# Patient Record
Sex: Female | Born: 1965 | Race: White | Hispanic: No | Marital: Single | Smoking: Former smoker
Health system: Southern US, Community
[De-identification: ages and names within clinical notes are randomized; demographics above are authoritative.]

## PROBLEM LIST (undated history)

## (undated) DIAGNOSIS — F319 Bipolar disorder, unspecified: Secondary | ICD-10-CM

## (undated) DIAGNOSIS — F329 Major depressive disorder, single episode, unspecified: Secondary | ICD-10-CM

## (undated) DIAGNOSIS — R569 Unspecified convulsions: Secondary | ICD-10-CM

## (undated) DIAGNOSIS — J449 Chronic obstructive pulmonary disease, unspecified: Secondary | ICD-10-CM

## (undated) DIAGNOSIS — R600 Localized edema: Secondary | ICD-10-CM

## (undated) DIAGNOSIS — F429 Obsessive-compulsive disorder, unspecified: Secondary | ICD-10-CM

## (undated) DIAGNOSIS — N189 Chronic kidney disease, unspecified: Secondary | ICD-10-CM

## (undated) DIAGNOSIS — G40909 Epilepsy, unspecified, not intractable, without status epilepticus: Secondary | ICD-10-CM

## (undated) DIAGNOSIS — R609 Edema, unspecified: Secondary | ICD-10-CM

## (undated) DIAGNOSIS — E079 Disorder of thyroid, unspecified: Secondary | ICD-10-CM

## (undated) DIAGNOSIS — K219 Gastro-esophageal reflux disease without esophagitis: Secondary | ICD-10-CM

## (undated) DIAGNOSIS — I10 Essential (primary) hypertension: Secondary | ICD-10-CM

## (undated) DIAGNOSIS — F419 Anxiety disorder, unspecified: Secondary | ICD-10-CM

## (undated) DIAGNOSIS — E039 Hypothyroidism, unspecified: Secondary | ICD-10-CM

## (undated) DIAGNOSIS — M1990 Unspecified osteoarthritis, unspecified site: Secondary | ICD-10-CM

## (undated) DIAGNOSIS — F32A Depression, unspecified: Secondary | ICD-10-CM

## (undated) DIAGNOSIS — F603 Borderline personality disorder: Secondary | ICD-10-CM

## (undated) DIAGNOSIS — D649 Anemia, unspecified: Secondary | ICD-10-CM

## (undated) HISTORY — DX: Borderline personality disorder: F60.3

## (undated) HISTORY — PX: KNEE ARTHROSCOPY: SUR90

## (undated) HISTORY — PX: TONSILLECTOMY: SUR1361

---

## 1999-05-23 HISTORY — PX: FRACTURE SURGERY: SHX138

## 2011-04-27 ENCOUNTER — Emergency Department (INDEPENDENT_AMBULATORY_CARE_PROVIDER_SITE_OTHER)
Admission: EM | Admit: 2011-04-27 | Discharge: 2011-04-27 | Disposition: A | Discharge status: Home / Self Care | Payer: Self-pay | Source: Home / Self Care | Attending: Family Medicine | Admitting: Family Medicine

## 2011-04-27 ENCOUNTER — Encounter: Payer: Self-pay | Admitting: Emergency Medicine

## 2011-04-27 DIAGNOSIS — E119 Type 2 diabetes mellitus without complications: Secondary | ICD-10-CM

## 2011-04-27 HISTORY — DX: Essential (primary) hypertension: I10

## 2011-04-27 HISTORY — DX: Unspecified osteoarthritis, unspecified site: M19.90

## 2011-04-27 HISTORY — DX: Epilepsy, unspecified, not intractable, without status epilepticus: G40.909

## 2011-04-27 LAB — POCT URINALYSIS DIP (DEVICE)
Glucose, UA: NEGATIVE mg/dL
Ketones, ur: NEGATIVE mg/dL
Leukocytes, UA: NEGATIVE
Specific Gravity, Urine: 1.015 (ref 1.005–1.030)

## 2011-04-27 LAB — POCT I-STAT, CHEM 8
BUN: 11 mg/dL (ref 6–23)
Creatinine, Ser: 0.8 mg/dL (ref 0.50–1.10)
Hemoglobin: 15 g/dL (ref 12.0–15.0)
Potassium: 4.2 mEq/L (ref 3.5–5.1)
Sodium: 137 mEq/L (ref 135–145)

## 2011-04-27 MED ORDER — METFORMIN HCL 500 MG PO TABS: ORAL_TABLET | ORAL | Status: DC

## 2011-04-27 NOTE — ED Provider Notes (Signed)
History     CSN: [id number] Arrival date & time: 04/27/2011  1:30 PM   First MD Initiated Contact with Patient 04/27/11 1305      Chief Complaint  Patient presents with  . Hyperglycemia    (Consider location/radiation/quality/duration/timing/severity/associated sxs/prior treatment) HPI Comments: The patient reports she has been out of her medications since 2009. She has been controlling her blood sugar by diet. She has had it checked on at least 2 occasions recently. State was 234 2 days ago. States this am it was 194 . She was seen at irc this am and told to come her for eval. She admits she has had some nausea but no vomiting. No diarrhea   The history is provided by the patient.    Past Medical History  Diagnosis Date  . Diabetes mellitus   . Asthma   . Arthritis   . Hypertension   . Epilepsy     Past Surgical History  Procedure Date  . Tonsillectomy   . Knee arthroscopy     Family History  Problem Relation Age of Onset  . Asthma Other   . Diabetes Other   . Hypertension Other     History  Substance Use Topics  . Smoking status: Never Smoker   . Smokeless tobacco: Not on file  . Alcohol Use: Yes     OCASSIONALLY    OB History    Grav Para Term Preterm Abortions TAB SAB Ect Mult Living                  Review of Systems  Constitutional: Negative.   HENT: Negative.   Respiratory: Negative.   Cardiovascular: Negative.   Gastrointestinal: Negative.   Genitourinary: Negative.   Musculoskeletal: Negative.     Allergies  Abilify and Lithium  Home Medications   Current Outpatient Rx  Name Route Sig Dispense Refill  . CARBAMAZEPINE 200 MG PO TABS Oral Take 200 mg by mouth 3 (three) times daily.      Marland Kitchen METFORMIN HCL 500 MG PO TABS  One tab daily x 1 wk then 1 tablet twice daily 40 tablet 1    BP 109/68  Pulse 64  Temp(Src) 98 F (36.7 C) (Tympanic)  Resp 18  SpO2 99%  Physical Exam  Nursing note and vitals reviewed. Constitutional: She  appears well-developed and well-nourished. She appears distressed.  HENT:  Head: Normocephalic and atraumatic.  Neck: Normal range of motion. Neck supple.  Cardiovascular: Normal rate and regular rhythm.   Pulmonary/Chest: Effort normal and breath sounds normal.  Abdominal: Soft. Bowel sounds are normal.    ED Course  Procedures (including critical care time)  Labs Reviewed  POCT URINALYSIS DIP (DEVICE) - Abnormal; Notable for the following:    Bilirubin Urine SMALL (*)    All other components within normal limits  POCT I-STAT, CHEM 8 - Abnormal; Notable for the following:    Glucose, Bld 185 (*)    All other components within normal limits  URINALYSIS, DIPSTICK ONLY  I-STAT, CHEM 8   No results found.   1. Diabetes mellitus    Patient reports she is seeing the social work who is helping her get an orange card   MDM          Luna Glasgow, MD 04/27/11 [phone]

## 2011-04-27 NOTE — ED Notes (Signed)
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

## 2011-05-24 ENCOUNTER — Emergency Department (INDEPENDENT_AMBULATORY_CARE_PROVIDER_SITE_OTHER)
Admission: EM | Admit: 2011-05-24 | Discharge: 2011-05-24 | Disposition: A | Discharge status: Home / Self Care | Payer: Self-pay | Source: Home / Self Care

## 2011-05-24 ENCOUNTER — Encounter (HOSPITAL_COMMUNITY): Payer: Self-pay

## 2011-05-24 DIAGNOSIS — R42 Dizziness and giddiness: Secondary | ICD-10-CM

## 2011-05-24 DIAGNOSIS — E119 Type 2 diabetes mellitus without complications: Secondary | ICD-10-CM

## 2011-05-24 DIAGNOSIS — R609 Edema, unspecified: Secondary | ICD-10-CM

## 2011-05-24 LAB — POCT PREGNANCY, URINE: Preg Test, Ur: NEGATIVE

## 2011-05-24 LAB — POCT URINALYSIS DIP (DEVICE)
Bilirubin Urine: NEGATIVE
Glucose, UA: NEGATIVE mg/dL
Hgb urine dipstick: NEGATIVE
Ketones, ur: NEGATIVE mg/dL
Leukocytes, UA: NEGATIVE
pH: 6 (ref 5.0–8.0)

## 2011-05-24 LAB — POCT I-STAT, CHEM 8
BUN: 5 mg/dL — ABNORMAL LOW (ref 6–23)
Calcium, Ion: 1.19 mmol/L (ref 1.12–1.32)
Chloride: 100 mEq/L (ref 96–112)
HCT: 39 % (ref 36.0–46.0)
Potassium: 4 mEq/L (ref 3.5–5.1)

## 2011-05-24 MED ORDER — MECLIZINE HCL 25 MG PO TABS: 25.0000 mg | ORAL_TABLET | Freq: Three times a day (TID) | ORAL | Status: AC | PRN

## 2011-05-24 NOTE — ED Provider Notes (Signed)
History     CSN: [id number]  Arrival date & time 05/24/11  1200   None     Chief Complaint  Patient presents with  . Dizziness    (Consider location/radiation/quality/duration/timing/severity/associated sxs/prior treatment) HPI Comments: Pt states she began feeling dizzy this morning while she was in the shower. And she has felt intermittently dizzy since, having troubling walking, walking sideways. She describes the dizziness as as feeling lightheaded, "like a balloon." She states she has a hx of gait imbalance and uses a cane daily. She is also concerned because she has chronic daily pain due to arthritis, and noticed when she woke up this morning that she is not hurting.  She denies HA, chest discomfort, dyspnea, parasthesias, or seizure. She has chronic swelling in LE and is increased today from her usual. She is taking her medications daily as prescribed.   Past Medical History  Diagnosis Date  . Diabetes mellitus   . Asthma   . Arthritis   . Hypertension   . Epilepsy     Past Surgical History  Procedure Date  . Tonsillectomy   . Knee arthroscopy     Family History  Problem Relation Age of Onset  . Asthma Other   . Diabetes Other   . Hypertension Other     History  Substance Use Topics  . Smoking status: Never Smoker   . Smokeless tobacco: Not on file  . Alcohol Use: Yes     OCASSIONALLY    OB History    Grav Para Term Preterm Abortions TAB SAB Ect Mult Living                  Review of Systems  Constitutional: Negative for fever, chills and fatigue.  HENT: Negative for ear pain, sore throat, rhinorrhea, sneezing, postnasal drip and sinus pressure.   Respiratory: Negative for cough, shortness of breath and wheezing.   Cardiovascular: Positive for leg swelling. Negative for chest pain and palpitations.  Gastrointestinal: Negative for nausea, vomiting and abdominal pain.  Neurological: Positive for light-headedness. Negative for seizures, weakness,  numbness and headaches.    Allergies  Abilify; Lithium; and Milk-related compounds  Home Medications   Current Outpatient Rx  Name Route Sig Dispense Refill  . CARBAMAZEPINE 200 MG PO TABS Oral Take 200 mg by mouth 3 (three) times daily.      Marland Kitchen MECLIZINE HCL 25 MG PO TABS Oral Take 1 tablet (25 mg total) by mouth every 8 (eight) hours as needed. 12 tablet 0  . METFORMIN HCL 500 MG PO TABS  One tab daily x 1 wk then 1 tablet twice daily 40 tablet 1    BP 115/73  Pulse 84  Temp(Src) 98.1 F (36.7 C) (Oral)  Resp 16  SpO2 100%  Physical Exam  Nursing note and vitals reviewed. Constitutional: She appears well-developed and well-nourished. No distress.       NAD, alert, talkative and happy. Walking around exam room and changes positions without symptoms during exam.   HENT:  Head: Normocephalic and atraumatic.  Right Ear: Tympanic membrane, external ear and ear canal normal.  Left Ear: Tympanic membrane, external ear and ear canal normal.  Nose: Nose normal.  Mouth/Throat: Uvula is midline, oropharynx is clear and moist and mucous membranes are normal. No oropharyngeal exudate, posterior oropharyngeal edema or posterior oropharyngeal erythema.  Eyes: Conjunctivae and lids are normal. Pupils are equal, round, and reactive to light.  Neck: Neck supple.  Cardiovascular: Normal rate, regular rhythm and normal  heart sounds.        1+ PTE bilat.  Pulmonary/Chest: Effort normal and breath sounds normal. No respiratory distress.  Lymphadenopathy:    She has no cervical adenopathy.  Neurological: She is alert. She has normal strength. No cranial nerve deficit. She displays a negative Romberg sign. Gait normal.  Reflex Scores:      Patellar reflexes are 2+ on the right side and 2+ on the left side. Skin: Skin is warm and dry.  Psychiatric: She has a normal mood and affect.    ED Course  Procedures (including critical care time)  Labs Reviewed  POCT I-STAT, CHEM 8 - Abnormal;  Notable for the following:    BUN 5 (*)    Glucose, Bld 135 (*)    All other components within normal limits  POCT URINALYSIS DIP (DEVICE)  POCT PREGNANCY, URINE  POCT URINALYSIS DIPSTICK  POCT PREGNANCY, URINE  I-STAT, CHEM 8   No results found.   1. Dizziness   2. Peripheral edema   3. Diabetes mellitus       MDM   Lightheadedness - onset this morning. No orthostatic changes and IStat gluc 135, otherwise neg. Exam neg except for 1+ PTE bilat.         Carmel [place], [place] 05/24/11 1650

## 2011-05-24 NOTE — ED Notes (Signed)
Recent return to [facility] from [facility]; resident of homeless shelter; out of metformin until returned to [facility], and re-connected [place]. States since back on metformin, has had swelling in both legs  (pitting edema bilateral to knee L>R) C/o has not felt like herself since back on metformin, felt as if her legs quit working and she had to sit on side of tub to get out of shower earlier today , c/o she has been waking as if she is drunk (denies ETOH use)

## 2011-05-25 NOTE — ED Provider Notes (Signed)
Medical screening examination/treatment/procedure(s) were performed by non-physician practitioner and as supervising physician I was immediately available for consultation/collaboration.  Howell Rucks, MD 05/25/11 2114

## 2011-06-19 ENCOUNTER — Encounter (HOSPITAL_COMMUNITY): Payer: Self-pay | Admitting: Emergency Medicine

## 2011-06-19 ENCOUNTER — Emergency Department (HOSPITAL_COMMUNITY)
Admission: EM | Admit: 2011-06-19 | Discharge: 2011-06-19 | Disposition: A | Discharge status: Home / Self Care | Payer: Self-pay | Attending: Emergency Medicine | Admitting: Emergency Medicine

## 2011-06-19 ENCOUNTER — Emergency Department (HOSPITAL_COMMUNITY): Payer: Self-pay

## 2011-06-19 DIAGNOSIS — E119 Type 2 diabetes mellitus without complications: Secondary | ICD-10-CM | POA: Insufficient documentation

## 2011-06-19 DIAGNOSIS — I10 Essential (primary) hypertension: Secondary | ICD-10-CM | POA: Insufficient documentation

## 2011-06-19 DIAGNOSIS — J45909 Unspecified asthma, uncomplicated: Secondary | ICD-10-CM | POA: Insufficient documentation

## 2011-06-19 DIAGNOSIS — R0602 Shortness of breath: Secondary | ICD-10-CM | POA: Insufficient documentation

## 2011-06-19 DIAGNOSIS — R05 Cough: Secondary | ICD-10-CM | POA: Insufficient documentation

## 2011-06-19 DIAGNOSIS — M7989 Other specified soft tissue disorders: Secondary | ICD-10-CM

## 2011-06-19 DIAGNOSIS — R059 Cough, unspecified: Secondary | ICD-10-CM | POA: Insufficient documentation

## 2011-06-19 DIAGNOSIS — R609 Edema, unspecified: Secondary | ICD-10-CM | POA: Insufficient documentation

## 2011-06-19 DIAGNOSIS — G40909 Epilepsy, unspecified, not intractable, without status epilepticus: Secondary | ICD-10-CM | POA: Insufficient documentation

## 2011-06-19 HISTORY — DX: Unspecified convulsions: R56.9

## 2011-06-19 LAB — COMPREHENSIVE METABOLIC PANEL
Albumin: 3.5 g/dL (ref 3.5–5.2)
Alkaline Phosphatase: 40 U/L (ref 39–117)
BUN: 8 mg/dL (ref 6–23)
CO2: 26 mEq/L (ref 19–32)
Chloride: 99 mEq/L (ref 96–112)
Creatinine, Ser: 0.52 mg/dL (ref 0.50–1.10)
GFR calc non Af Amer: >90 mL/min (ref >90)
Glucose, Bld: 202 mg/dL — ABNORMAL HIGH (ref 70–99)
Potassium: 3.7 mEq/L (ref 3.5–5.1)
Total Bilirubin: 0.2 mg/dL — ABNORMAL LOW (ref 0.3–1.2)

## 2011-06-19 LAB — APTT: aPTT: 26 seconds (ref 24–37)

## 2011-06-19 LAB — DIFFERENTIAL
Basophils Relative: 0 % (ref 0–1)
Lymphocytes Relative: 34 % (ref 12–46)
Lymphs Abs: 2.7 10*3/uL (ref 0.7–4.0)
Monocytes Absolute: 0.4 10*3/uL (ref 0.1–1.0)
Monocytes Relative: 5 % (ref 3–12)
Neutro Abs: 4.7 10*3/uL (ref 1.7–7.7)
Neutrophils Relative %: 60 % (ref 43–77)

## 2011-06-19 LAB — CBC
HCT: 35 % — ABNORMAL LOW (ref 36.0–46.0)
Hemoglobin: 11.9 g/dL — ABNORMAL LOW (ref 12.0–15.0)
MCHC: 34 g/dL (ref 30.0–36.0)
RBC: 3.75 MIL/uL — ABNORMAL LOW (ref 3.87–5.11)

## 2011-06-19 LAB — PRO B NATRIURETIC PEPTIDE: Pro B Natriuretic peptide (BNP): 34.1 pg/mL (ref 0–125)

## 2011-06-19 MED ORDER — IPRATROPIUM BROMIDE 0.02 % IN SOLN
0.5000 mg | Freq: Once | RESPIRATORY_TRACT | Status: AC
  Administered 2011-06-19: 0.5 mg via RESPIRATORY_TRACT
  Filled 2011-06-19: qty 2.5

## 2011-06-19 MED ORDER — SPIRONOLACTONE 25 MG PO TABS: 25.0000 mg | ORAL_TABLET | Freq: Every day | ORAL | Status: DC

## 2011-06-19 MED ORDER — ALBUTEROL SULFATE (5 MG/ML) 0.5% IN NEBU
5.0000 mg | INHALATION_SOLUTION | Freq: Once | RESPIRATORY_TRACT | Status: AC
  Administered 2011-06-19: 5 mg via RESPIRATORY_TRACT
  Filled 2011-06-19: qty 1

## 2011-06-19 NOTE — ED Notes (Signed)
Bilateral leg swelling over a course of few days. Pt. Went to [facility] and was sent to use for specialized care.  Pt. States that her legs are very sore.  bialteral legs are +3 edema, shiny and pink

## 2011-06-19 NOTE — Progress Notes (Signed)
VASCULAR LAB PRELIMINARY  PRELIMINARY  PRELIMINARY  PRELIMINARY  Bilateral lower extremity venous Dopplers completed.    Preliminary report:  No DVT or SVT noted in the bilateral lower extremities.  Sharion Dove [place], 06/19/2011, 1:41 PM

## 2011-06-19 NOTE — ED Provider Notes (Signed)
History     CSN: [id number]  Arrival date & time 06/19/11  1124   First MD Initiated Contact with Patient 06/19/11 1148      Chief Complaint  Patient presents with  . Leg Swelling    (Consider location/radiation/quality/duration/timing/severity/associated sxs/prior treatment) HPI  Patient relates she started getting swelling of her legs about 3 days ago. She relates she's had swelling in the past but not as severe as this episode. She states the only change in activity is she's been walking more looking for a job. She states she feels short of breath today with wheezing that started last night. She also has had a cough today and is nonproductive without fever. She denies chest pain. She was seen at her doctor's office today and sent to the ER for further evaluation. She relates her swelling is better than it was when she first got up this morning but still present.  Patient relates she was diagnosed with diabetes in 1996 however she been off her medicine for over a year. She relates she's now is going to see Dr. Rosezetta Schlatter in his back on her medicine. She also relates she has run out of her inhaler.  PCP Dr. Siri Cole  Past Medical History  Diagnosis Date  . Diabetes mellitus   . Asthma   . Arthritis   . Hypertension   . Epilepsy   . Seizures     Past Surgical History  Procedure Date  . Tonsillectomy   . Knee arthroscopy     Family History  Problem Relation Age of Onset  . Asthma Other   . Diabetes Other   . Hypertension Other     History  Substance Use Topics  . Smoking status: 2ppd, now 7 cigs a day  . Smokeless tobacco: Not on file  . 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    Allergies  Abilify;  Lithium; and Milk-related compounds  Home Medications   Current Outpatient Rx  Name Route Sig Dispense Refill  . ALBUTEROL SULFATE HFA 108 (90 BASE) MCG/ACT IN AERS Inhalation Inhale 2 puffs into the lungs every 6 (six) hours as needed.    . B COMPLEX PO TABS Oral Take 1 tablet by mouth 2 (two) times daily.    Marland Kitchen CARBAMAZEPINE 200 MG PO TABS Oral Take 200 mg by mouth 2 (two) times daily. Take 1 tablet in the morning and 2 tablets in the evening    . IBUPROFEN 200 MG PO TABS Oral Take 800 mg by mouth every 8 (eight) hours as needed. For pain    . METFORMIN HCL 500 MG PO TABS Oral Take 500 mg by mouth 2 (two) times daily with a meal.      BP 135/77  Pulse 57  Temp(Src) 97.5 F (36.4 C) (Oral)  Resp 18  Ht 5\' 5"  (1.651 m)  Wt 236 lb (107.049 kg)  BMI 39.27 kg/m2  SpO2 96%  Vital signs normal except for bradycardia  Physical Exam  Nursing note and vitals reviewed. Constitutional: She is oriented to person, place, and time. She appears well-developed and well-nourished.  Non-toxic appearance.  She does not appear ill. No distress.       Face flushed  HENT:  Head: Normocephalic and atraumatic.  Right Ear: External ear normal.  Left Ear: External ear normal.  Nose: Nose normal. No mucosal edema or rhinorrhea.  Mouth/Throat: Oropharynx is clear and moist and mucous membranes are normal. No dental abscesses or uvula swelling.  Eyes: Conjunctivae and EOM are normal. Pupils are equal, round, and reactive to light.  Neck: Normal range of motion and full passive range of motion without pain. Neck supple.  Cardiovascular: Normal rate, regular rhythm and normal heart sounds.  Exam reveals no gallop and no friction rub.   No murmur heard. Pulmonary/Chest: Effort normal. No respiratory distress. She has no wheezes. She has no rhonchi. She has no rales. She exhibits no tenderness and no crepitus.       Patient has diffuse diminished breath sounds with coughing at times  Abdominal: Soft. Normal  appearance and bowel sounds are normal. She exhibits no distension. There is no tenderness. There is no rebound and no guarding.  Musculoskeletal: Normal range of motion. She exhibits no edema and no tenderness.       Moves all extremities well. Patient's noted to have diffuse swelling of her ankles and lower legs with some shininess of her skin. She has nonpitting edema. She has some tenderness of both calves. The skin is not red there is no open lesion seen, the legs are not warm to touch  Neurological: She is alert and oriented to person, place, and time. She has normal strength. No cranial nerve deficit.  Skin: Skin is warm, dry and intact. No rash noted. No erythema. No pallor.  Psychiatric: She has a normal mood and affect. Her speech is normal and behavior is normal. Her mood appears not anxious.    ED Course  Procedures (including critical care time)   Results for orders placed during the hospital encounter of 06/19/11  CBC      Component Value Range   WBC 7.8  4.0 - 10.5 (K/uL)   RBC 3.75 (*) 3.87 - 5.11 (MIL/uL)   Hemoglobin 11.9 (*) 12.0 - 15.0 (g/dL)   HCT 35.0 (*) 36.0 - 46.0 (%)   MCV 93.3  78.0 - 100.0 (fL)   MCH 31.7  26.0 - 34.0 (pg)   MCHC 34.0  30.0 - 36.0 (g/dL)   RDW 13.5  11.5 - 15.5 (%)   Platelets 194  150 - 400 (K/uL)  DIFFERENTIAL      Component Value Range   Neutrophils Relative 60  43 - 77 (%)   Neutro Abs 4.7  1.7 - 7.7 (K/uL)   Lymphocytes Relative 34  12 - 46 (%)   Lymphs Abs 2.7  0.7 - 4.0 (K/uL)   Monocytes Relative 5  3 - 12 (%)   Monocytes Absolute 0.4  0.1 - 1.0 (K/uL)   Eosinophils Relative 1  0 - 5 (%)   Eosinophils Absolute 0.1  0.0 - 0.7 (K/uL)   Basophils Relative 0  0 - 1 (%)   Basophils Absolute 0.0  0.0 - 0.1 (K/uL)  COMPREHENSIVE METABOLIC PANEL      Component Value Range   Sodium 136  135 - 145 (mEq/L)   Potassium 3.7  3.5 - 5.1 (mEq/L)   Chloride 99  96 - 112 (mEq/L)   CO2 26  19 - 32 (mEq/L)   Glucose, Bld 202 (*) 70 - 99  (mg/dL)   BUN 8  6 -  23 (mg/dL)   Creatinine, Ser 0.52  0.50 - 1.10 (mg/dL)   Calcium 9.6  8.4 - 10.5 (mg/dL)   Total Protein 7.0  6.0 - 8.3 (g/dL)   Albumin 3.5  3.5 - 5.2 (g/dL)   AST 10  0 - 37 (U/L)   ALT 10  0 - 35 (U/L)   Alkaline Phosphatase 40  39 - 117 (U/L)   Total Bilirubin 0.2 (*) 0.3 - 1.2 (mg/dL)   GFR calc non Af Amer >90  >90 (mL/min)   GFR calc Af Amer >90  >90 (mL/min)  PRO B NATRIURETIC PEPTIDE      Component Value Range   Pro B Natriuretic peptide (BNP) 34.1  0 - 125 (pg/mL)  APTT      Component Value Range   aPTT 26  24 - 37 (seconds)  PROTIME-INR      Component Value Range   Prothrombin Time 13.6  11.6 - 15.2 (seconds)   INR 1.02  0.00 - 1.49   D-DIMER, QUANTITATIVE      Component Value Range   D-Dimer, Quant 0.25  0.00 - 0.48 (ug/mL-FEU)   Laboratory interpretation all normal except out anemia   Doppler US venous of both legs preliminary negative.    Diagnoses that have been ruled out:  None  Diagnoses that are still under consideration:  None  Final diagnoses:  Peripheral edema   New Prescriptions   SPIRONOLACTONE (ALDACTONE) 25 MG TABLET    Take 1 tablet (25 mg total) by mouth daily.   Plan discharge  Rolland Porter, MD, [place], MD 06/19/11 [phone]

## 2011-07-25 ENCOUNTER — Encounter (HOSPITAL_COMMUNITY): Payer: Self-pay | Admitting: Emergency Medicine

## 2011-07-25 ENCOUNTER — Encounter (HOSPITAL_COMMUNITY): Payer: Self-pay | Admitting: *Deleted

## 2011-07-25 ENCOUNTER — Emergency Department (HOSPITAL_COMMUNITY)
Admission: EM | Admit: 2011-07-25 | Discharge: 2011-07-25 | Disposition: A | Discharge status: Home / Self Care | Payer: Self-pay | Attending: Emergency Medicine | Admitting: Emergency Medicine

## 2011-07-25 ENCOUNTER — Emergency Department (INDEPENDENT_AMBULATORY_CARE_PROVIDER_SITE_OTHER)
Admission: EM | Admit: 2011-07-25 | Discharge: 2011-07-25 | Disposition: A | Discharge status: Nursing Home (Includes ICF) | Payer: Self-pay | Source: Home / Self Care | Attending: Emergency Medicine | Admitting: Emergency Medicine

## 2011-07-25 DIAGNOSIS — E079 Disorder of thyroid, unspecified: Secondary | ICD-10-CM | POA: Insufficient documentation

## 2011-07-25 DIAGNOSIS — J4489 Other specified chronic obstructive pulmonary disease: Secondary | ICD-10-CM | POA: Insufficient documentation

## 2011-07-25 DIAGNOSIS — G40909 Epilepsy, unspecified, not intractable, without status epilepticus: Secondary | ICD-10-CM

## 2011-07-25 DIAGNOSIS — F17200 Nicotine dependence, unspecified, uncomplicated: Secondary | ICD-10-CM | POA: Insufficient documentation

## 2011-07-25 DIAGNOSIS — I10 Essential (primary) hypertension: Secondary | ICD-10-CM | POA: Insufficient documentation

## 2011-07-25 DIAGNOSIS — R29818 Other symptoms and signs involving the nervous system: Secondary | ICD-10-CM | POA: Insufficient documentation

## 2011-07-25 DIAGNOSIS — E119 Type 2 diabetes mellitus without complications: Secondary | ICD-10-CM | POA: Insufficient documentation

## 2011-07-25 DIAGNOSIS — M129 Arthropathy, unspecified: Secondary | ICD-10-CM | POA: Insufficient documentation

## 2011-07-25 DIAGNOSIS — J449 Chronic obstructive pulmonary disease, unspecified: Secondary | ICD-10-CM | POA: Insufficient documentation

## 2011-07-25 HISTORY — DX: Chronic obstructive pulmonary disease, unspecified: J44.9

## 2011-07-25 HISTORY — DX: Disorder of thyroid, unspecified: E07.9

## 2011-07-25 LAB — CBC
Hemoglobin: 13 g/dL (ref 12.0–15.0)
MCH: 32.1 pg (ref 26.0–34.0)
MCV: 94.6 fL (ref 78.0–100.0)
RBC: 4.05 MIL/uL (ref 3.87–5.11)
WBC: 8.7 10*3/uL (ref 4.0–10.5)

## 2011-07-25 LAB — URINALYSIS, ROUTINE W REFLEX MICROSCOPIC
Bilirubin Urine: NEGATIVE
Ketones, ur: NEGATIVE mg/dL
Leukocytes, UA: NEGATIVE
Nitrite: NEGATIVE
Urobilinogen, UA: 0.2 mg/dL (ref 0.0–1.0)
pH: 5.5 (ref 5.0–8.0)

## 2011-07-25 LAB — DIFFERENTIAL
Eosinophils Absolute: 0.1 10*3/uL (ref 0.0–0.7)
Eosinophils Relative: 1 % (ref 0–5)
Lymphocytes Relative: 43 % (ref 12–46)
Lymphs Abs: 3.7 10*3/uL (ref 0.7–4.0)
Monocytes Relative: 5 % (ref 3–12)

## 2011-07-25 LAB — COMPREHENSIVE METABOLIC PANEL
ALT: 12 U/L (ref 0–35)
Alkaline Phosphatase: 42 U/L (ref 39–117)
BUN: 10 mg/dL (ref 6–23)
CO2: 25 mEq/L (ref 19–32)
Calcium: 10.1 mg/dL (ref 8.4–10.5)
GFR calc Af Amer: >90 mL/min (ref >90)
GFR calc non Af Amer: >90 mL/min (ref >90)
Glucose, Bld: 144 mg/dL — ABNORMAL HIGH (ref 70–99)
Potassium: 4.3 mEq/L (ref 3.5–5.1)
Total Protein: 7.6 g/dL (ref 6.0–8.3)

## 2011-07-25 MED ORDER — LORAZEPAM 1 MG PO TABS
1.0000 mg | ORAL_TABLET | Freq: Once | ORAL | Status: AC
  Administered 2011-07-25: 1 mg via ORAL
  Filled 2011-07-25: qty 1

## 2011-07-25 NOTE — Discharge Instructions (Signed)
We have determined that your problem requires further evaluation in the emergency department.  We will take care of your transport there.  Once at the emergency department, you will be evaluated by a provider and they will order whatever treatment or tests they deem necessary.  We cannot guarantee that they will do any specific test or do any specific treatment.  ° °

## 2011-07-25 NOTE — ED Notes (Signed)
Pt reports a 4 year h/o seizures, gran mal and petit mal.  She was told she may have had psycho-motor seizures in the past week,  This AM she also  had some seizure activity ---dizziness, and blank stares.  She lives in a shelter.  AT present, c/o headache and weakness and generalized aching.

## 2011-07-25 NOTE — ED Provider Notes (Signed)
Chief Complaint  Patient presents with  . Seizures    History of Present Illness:   The patient is a 46 year old homeless female with multiple medical and psychiatric problems who has had a long-standing history of seizures. These have been grand mal, petit mal, and psychomotor. Today she had an episode which was possibly brought on by flashing lights or by motion. She can't give a very good description of the episode, but thinks it might of been a psychomotor seizure. She notes she felt dizzy and affected her vision. Right now she has a headache and has difficulty with ambulation. She's unstable. Her vision still seems a little bit blurry and she notes numbness of her hands and feet. She denies any difficulty with speech, swallowing, or focal muscular weakness. She lives at a homeless shelter. [facility]. She goes to [facility] for her psychiatric care. She has a bipolar disorder, rage disorder, and right now she is in a manic phase of her bipolar disorder. Her psychiatrist has been prescribing her psychiatric meds including Tegretol 200 mg which has recently been increased to 4 per day, and Neurontin 300 mg twice a day. She also takes medication for diabetes, asthma, and COPD. She does not have a neurologist.  Review of Systems:  Other than noted above, the patient denies any of the following symptoms: Systemic:  No fever, chills, fatigue, photophobia, stiff neck. Eye:  No redness, eye pain, discharge, blurred vision, or diplopia. ENT:  No nasal congestion, rhinorrhea, sinus pressure or pain, sneezing, earache, or sore throat.  No jaw claudication. Neuro:  No paresthesias, loss of consciousness, seizure activity, muscle weakness, trouble with coordination or gait, trouble speaking or swallowing. Psych:  No depression, anxiety or trouble sleeping.  [place]:  Past medical history, family history, social history, meds, and allergies were  reviewed.  Physical Exam:   Vital signs:  BP 110/49  Pulse 76  Temp(Src) 97.9 F (36.6 C) (Oral)  Resp 18  SpO2 96%  LMP 06/30/2011 General:  Alert and oriented.  In no distress. Eye:  Lids and conjunctivas normal.  PERRL,  Full EOMs.  Fundi benign with normal discs and vessels. ENT:  No cranial or facial tenderness to palpation.  TMs and canals clear.  Nasal mucosa was normal and uncongested without any drainage. No intra oral lesions, pharynx clear, mucous membranes moist, dentition normal. Neck:  Supple, full ROM, no tenderness to palpation.  No adenopathy or mass. Neuro:  Alert and orented times 3.  Speech was clear, fluent, and appropriate.  Cranial nerves intact. No pronator drift, muscle strength normal. Finger to nose normal.  DTRs 2+ .Station and gait were normal.  Romberg's sign was normal.  Able to perform tandem gait well. Psych:  Normal affect.  Assessment:   Diagnoses that have been ruled out:  None  Diagnoses that are still under consideration:  None  Final diagnoses:  Seizure disorder    Plan:   1.  The patient will be sent to the emergency department via shuttle for further evaluation.  Birdena Crandall, MD 07/25/11 [phone]

## 2011-07-25 NOTE — Discharge Instructions (Signed)
Driving and Equipment Restrictions Some medical problems make it dangerous to drive, ride a bike, or use machines. Some of these problems are:  A hard blow to the head (concussion).   Passing out (fainting).   Twitching and shaking (seizures).   Low blood sugar.   Taking medicine to help you relax (sedatives).   Taking pain medicines.   Wearing an eye patch.   Wearing splints. This can make it hard to use parts of your body that you need to drive safely.  HOME CARE   Do not drive until your doctor says it is okay.   Do not use machines until your doctor says it is okay.  You may need a form signed by your doctor (medical release) before you can drive again. You may also need this form before you do other tasks where you need to be fully alert. MAKE SURE YOU:  Understand these instructions.   Will watch your condition.   Will get help right away if you are not doing well or get worse.  Document Released: 06/15/2004 Document Revised: 04/27/2011 Document Reviewed: 09/15/2009 [facility] Patient Information 2012 [place].  Seizures You had a seizure. About 2% of the population will have a seizure problem during their lifetime. Sometimes the cause for the seizure is not known. Seizures are usually associated with one of these problems:  Epilepsy.   Not taking your seizure medicine.   Alcohol and drug abuse.   Head injury, strokes, tumors, and brain surgery.   High fever and infections.   Low blood sugar.  Evaluating a new seizure disorder may require having a brain scan or a brain wave test called an EEG. If you have been given a seizure medicine, it is very important that you take it as prescribed. Not taking these medicines as directed is the most common cause of seizures. Blood tests are often used to be sure you are taking the proper dose.  Seizures cause many different symptoms, from convulsions to brief blackouts. Do not ride a bike, drive a car, go swimming,  climb in high or dangerous places such as ladders or roofs, or operate any dangerous equipment until you have your doctor's permission. If you hold a driver's license, state law may require that a report be made to the motor vehicles department. You should wear an emergency medical identification bracelet with information about your seizures. If you have any warning that a seizure may occur, lie down in a safe place to protect yourself. Teach your family and friends what to do if you have any further seizures. They should stay calm and try to keep you from falling on hard or sharp objects. It is best not to try to restrain a seizing person or to force anything into his or her mouth. Do not try to open clenched jaws. When the seizure is over, the person should be rolled on their side to help drain any vomit or secretions from the mouth. After a seizure, a person may be confused or drowsy for several minutes. An ambulance should be called if the seizure lasted more than 5 minutes or if confusion remains for more than 30 minutes. Call your caregiver or the emergency department for further instructions. Do not drive until cleared by your caregiver or neurologist! Document Released: 06/15/2004 Document Revised: 01/18/2011 Document Reviewed: 05/08/2005 [facility] Patient Information 2012 [place], [place].

## 2011-07-25 NOTE — ED Notes (Signed)
Patient requesting to eat.  States not eaten since yesterday.  Explained need to wait for results first.  Family at bedside.

## 2011-07-25 NOTE — ED Provider Notes (Signed)
History     CSN: [id number]  Arrival date & time 07/25/11  1831   First MD Initiated Contact with Patient 07/25/11 2102     9:30 PM HPI Patient reports she was sent to the emergency department due to seizures. 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 States [facility] is trying to set her up with a neurologist. Reports she takes Tegretol for her psychiatric illness but since taking Tegretol she has not had a grand mal seizure. Reports today she has not had a seizure but has had her typical aura for seizure. States aura usually consists of an echo tight feeling and numbness and tingling in her hands and feet. Reports symptoms have significantly decreased since this morning. States she has not had a seizure all day. Denies headache, aphasia, ataxia, chest pain, shortness of breath.  Patient is a 46 y.o. female presenting with seizures. The history is provided by the patient.  Seizures  Number of times: 0. Pertinent negatives include no headaches, no speech difficulty, no chest pain, no nausea and no vomiting. There was the sensation of an aura present. There has been no fever.    Past Medical History  Diagnosis Date  . Diabetes mellitus   . Asthma   . Arthritis   . Hypertension   . Epilepsy   . Seizures   . Thyroid disease   . COPD (chronic obstructive pulmonary disease)     Past Surgical History  Procedure Date  . Tonsillectomy   . Knee arthroscopy     Family History  Problem Relation Age of Onset  . Asthma Other   . Diabetes Other   . Hypertension Other     History  Substance Use Topics  . Smoking status: Current Everyday Smoker -- 0.5 packs/day for 5 years    Types: Cigarettes  . Smokeless tobacco: Not on file  . Alcohol Use: Yes     OCASSIONALLY    OB History    Grav Para Term Preterm Abortions TAB SAB Ect Mult Living                  Review of Systems  Constitutional: Negative for fever and chills.  Respiratory:  Negative for shortness of breath.   Cardiovascular: Negative for chest pain.  Gastrointestinal: Negative for nausea and vomiting.  Musculoskeletal: Negative for back pain.  Neurological: Negative for dizziness, tremors, syncope, facial asymmetry, speech difficulty, weakness, light-headedness, numbness and headaches. Seizures: aura.  Psychiatric/Behavioral: Negative for behavioral problems and agitation.    Allergies  Lithium; Milk-related compounds; and Abilify  Home Medications   Current Outpatient Rx  Name Route Sig Dispense Refill  . ALBUTEROL SULFATE HFA 108 (90 BASE) MCG/ACT IN AERS Inhalation Inhale 2 puffs into the lungs every 6 (six) hours as needed.    . CARBAMAZEPINE 200 MG PO TABS Oral Take 200 mg by mouth QID. Take 1 tablet in the morning and 2 tablets in the evening    . GABAPENTIN 300 MG PO CAPS Oral Take 300 mg by mouth 2 (two) times daily with a meal.    . IBUPROFEN 200 MG PO TABS Oral Take 800 mg by mouth every 8 (eight) hours as needed. For pain    . LISINOPRIL 2.5 MG PO TABS Oral Take 2.5 mg by mouth daily.    Marland Kitchen METFORMIN HCL 500 MG PO TABS Oral Take 1,000 mg by mouth 2 (two) times daily with a meal.     . SPIRONOLACTONE  25 MG PO TABS Oral Take 1 tablet (25 mg total) by mouth daily. 10 tablet 0    BP 114/70  Pulse 68  Temp(Src) 98.3 F (36.8 C) (Oral)  Resp 20  Ht 5\' 5"  (1.651 m)  Wt 240 lb (108.863 kg)  BMI 39.94 kg/m2  SpO2 94%  LMP 06/30/2011  Physical Exam  Vitals reviewed. Constitutional: She is oriented to person, place, and time. Vital signs are normal. She appears well-developed and well-nourished.  HENT:  Head: Normocephalic and atraumatic.  Eyes: Conjunctivae and EOM are normal. Pupils are equal, round, and reactive to light.  Neck: Normal range of motion. Neck supple.  Cardiovascular: Normal rate, regular rhythm and normal heart sounds.  Exam reveals no friction rub.   No murmur heard. Pulmonary/Chest: Effort normal and breath sounds normal.  She has no wheezes. She has no rhonchi. She has no rales. She exhibits no tenderness.  Musculoskeletal: Normal range of motion.  Neurological: She is alert and oriented to person, place, and time. No cranial nerve deficit. She exhibits normal muscle tone. Coordination normal.  Skin: Skin is warm and dry. No rash noted. No erythema. No pallor.    ED Course  Procedures   Results for orders placed during the hospital encounter of 07/25/11  CBC      Component Value Range   WBC 8.7  4.0 - 10.5 (K/uL)   RBC 4.05  3.87 - 5.11 (MIL/uL)   Hemoglobin 13.0  12.0 - 15.0 (g/dL)   HCT 38.3  36.0 - 46.0 (%)   MCV 94.6  78.0 - 100.0 (fL)   MCH 32.1  26.0 - 34.0 (pg)   MCHC 33.9  30.0 - 36.0 (g/dL)   RDW 13.5  11.5 - 15.5 (%)   Platelets 217  150 - 400 (K/uL)  DIFFERENTIAL      Component Value Range   Neutrophils Relative 51  43 - 77 (%)   Neutro Abs 4.5  1.7 - 7.7 (K/uL)   Lymphocytes Relative 43  12 - 46 (%)   Lymphs Abs 3.7  0.7 - 4.0 (K/uL)   Monocytes Relative 5  3 - 12 (%)   Monocytes Absolute 0.4  0.1 - 1.0 (K/uL)   Eosinophils Relative 1  0 - 5 (%)   Eosinophils Absolute 0.1  0.0 - 0.7 (K/uL)   Basophils Relative 0  0 - 1 (%)   Basophils Absolute 0.0  0.0 - 0.1 (K/uL)  COMPREHENSIVE METABOLIC PANEL      Component Value Range   Sodium 136  135 - 145 (mEq/L)   Potassium 4.3  3.5 - 5.1 (mEq/L)   Chloride 101  96 - 112 (mEq/L)   CO2 25  19 - 32 (mEq/L)   Glucose, Bld 144 (*) 70 - 99 (mg/dL)   BUN 10  6 - 23 (mg/dL)   Creatinine, Ser 0.58  0.50 - 1.10 (mg/dL)   Calcium 10.1  8.4 - 10.5 (mg/dL)   Total Protein 7.6  6.0 - 8.3 (g/dL)   Albumin 3.7  3.5 - 5.2 (g/dL)   AST 17  0 - 37 (U/L)   ALT 12  0 - 35 (U/L)   Alkaline Phosphatase 42  39 - 117 (U/L)   Total Bilirubin 0.1 (*) 0.3 - 1.2 (mg/dL)   GFR calc non Af Amer >90  >90 (mL/min)   GFR calc Af Amer >90  >90 (mL/min)  URINALYSIS, ROUTINE W REFLEX MICROSCOPIC      Component Value Range   Color,  Urine YELLOW  YELLOW     APPearance CLEAR  CLEAR    Specific Gravity, Urine 1.017  1.005 - 1.030    pH 5.5  5.0 - 8.0    Glucose, UA NEGATIVE  NEGATIVE (mg/dL)   Hgb urine dipstick NEGATIVE  NEGATIVE    Bilirubin Urine NEGATIVE  NEGATIVE    Ketones, ur NEGATIVE  NEGATIVE (mg/dL)   Protein, ur NEGATIVE  NEGATIVE (mg/dL)   Urobilinogen, UA 0.2  0.0 - 1.0 (mg/dL)   Nitrite NEGATIVE  NEGATIVE    Leukocytes, UA NEGATIVE  NEGATIVE   CARBAMAZEPINE LEVEL, TOTAL      Component Value Range   Carbamazepine Lvl 4.8  4.0 - 12.0 (ug/mL)     MDM    Refer patient to neurology. However recommended that she continue to allow [facility] set up neurology appointment for her. Labs all within nml limits. Reports aura has resolved with medication. Family is in the room with patient and will take her home.  Pt voices understanding and is ready for d/c     Sheliah Mends, PA-C 07/25/11 2331

## 2011-07-25 NOTE — ED Notes (Signed)
Discharged home with family members; steady gait.

## 2011-07-25 NOTE — ED Notes (Signed)
Pt brought to ED from Urgent Care with c/o possible seizure.      See notes from Urgent Care.  PT st's she is having a new type seizure st's when everything sounds loud in her head it causes her to hit herself in the head

## 2011-07-26 NOTE — ED Provider Notes (Signed)
Medical screening examination/treatment/procedure(s) were performed by non-physician practitioner and as supervising physician I was immediately available for consultation/collaboration.   Julianne Rice, MD 07/26/11 2038

## 2011-08-15 ENCOUNTER — Ambulatory Visit (HOSPITAL_COMMUNITY): Admission: RE | Admit: 2011-08-15 | Payer: Self-pay | Source: Ambulatory Visit

## 2011-08-24 ENCOUNTER — Ambulatory Visit (HOSPITAL_COMMUNITY)
Admission: RE | Admit: 2011-08-24 | Discharge: 2011-08-24 | Disposition: A | Discharge status: Home / Self Care | Payer: Self-pay | Source: Ambulatory Visit | Attending: Family Medicine | Admitting: Family Medicine

## 2011-08-24 DIAGNOSIS — R600 Localized edema: Secondary | ICD-10-CM

## 2011-08-24 DIAGNOSIS — R609 Edema, unspecified: Secondary | ICD-10-CM | POA: Insufficient documentation

## 2011-08-24 DIAGNOSIS — F17200 Nicotine dependence, unspecified, uncomplicated: Secondary | ICD-10-CM | POA: Insufficient documentation

## 2011-08-24 NOTE — Progress Notes (Signed)
*  PRELIMINARY RESULTS* Echocardiogram 2D Echocardiogram has been performed.  Roxine [facility] 08/24/2011, 11:16 AM

## 2011-10-26 ENCOUNTER — Encounter (HOSPITAL_COMMUNITY): Payer: Self-pay | Admitting: Emergency Medicine

## 2011-10-26 ENCOUNTER — Emergency Department (INDEPENDENT_AMBULATORY_CARE_PROVIDER_SITE_OTHER)
Admission: EM | Admit: 2011-10-26 | Discharge: 2011-10-26 | Disposition: A | Discharge status: Home / Self Care | Payer: Self-pay | Source: Home / Self Care | Attending: Emergency Medicine | Admitting: Emergency Medicine

## 2011-10-26 DIAGNOSIS — R609 Edema, unspecified: Secondary | ICD-10-CM

## 2011-10-26 HISTORY — DX: Bipolar disorder, unspecified: F31.9

## 2011-10-26 HISTORY — DX: Edema, unspecified: R60.9

## 2011-10-26 HISTORY — DX: Localized edema: R60.0

## 2011-10-26 HISTORY — DX: Obsessive-compulsive disorder, unspecified: F42.9

## 2011-10-26 LAB — POCT I-STAT, CHEM 8
Calcium, Ion: 1.2 mmol/L (ref 1.12–1.32)
Creatinine, Ser: 0.6 mg/dL (ref 0.50–1.10)
Glucose, Bld: 155 mg/dL — ABNORMAL HIGH (ref 70–99)
HCT: 38 % (ref 36.0–46.0)
Hemoglobin: 12.9 g/dL (ref 12.0–15.0)
Potassium: 4.2 mEq/L (ref 3.5–5.1)
TCO2: 22 mmol/L (ref 0–100)

## 2011-10-26 MED ORDER — FUROSEMIDE 20 MG PO TABS: 40.0000 mg | ORAL_TABLET | Freq: Every day | ORAL | Status: DC

## 2011-10-26 NOTE — ED Provider Notes (Signed)
History     CSN: [id number]  Arrival date & time 10/26/11  1102   First MD Initiated Contact with Patient 10/26/11 1111      Chief Complaint  Patient presents with  . Leg Swelling    (Consider location/radiation/quality/duration/timing/severity/associated sxs/prior treatment) HPI Comments: Patient with multiple medical problems including diabetes, peripheral edema, hypertension presents with bilateral lower extremity redness, swelling after walking for prolonged period of time yesterday. She was sent here by the [facility] nurse to evaluate for infection and/or to change her Lasix. She is currently taking 20 mg a day. Does not recall any trauma to her legs. She elevated her feet last night with some improvement in swelling. No nausea, vomiting. No change in her baseline paresthesias. States she felt feverish last night, but did not have a documented temperature. She states that diabetes has been under good control.  She is homeless, but is staying at the shelter.  Patient  Was seen in ED on 06/19/11 for bilateral lower extremity. She was noted to have diffuse lower extremity swelling with some shininess of the skin. No pitting edema. D-dimer and BNP were negative. labs remarkable for mild anemia  At 11.9. Doppler ultrasounds were negative. She was thought to have peripheral edema. She was started on spironolactone 25 mg once daily. She states she was taken off this by her primary care physician at [place], and was started on Lasix instead. She has not takien the spironolactone in over 2 months.  ROS as noted in HPI. All other ROS negative.   Patient is a 46 y.o. female presenting with leg pain. The history is provided by the patient. No language interpreter was used.  Leg Pain  The incident occurred yesterday. The incident occurred at home. The injury mechanism is unknown. The pain is present in the left leg and right leg. The quality of the pain is described as aching. Pertinent negatives include  no inability to bear weight. She reports no foreign bodies present. The symptoms are aggravated by bearing weight. She has tried elevation for the symptoms. The treatment provided mild relief.    Past Medical History  Diagnosis Date  . Diabetes mellitus   . Asthma   . Arthritis   . Hypertension   . Epilepsy   . Seizures   . Thyroid disease   . COPD (chronic obstructive pulmonary disease)   . Bipolar 1 disorder   . Peripheral edema   . OCD (obsessive compulsive disorder)     Past Surgical History  Procedure Date  . Tonsillectomy   . Knee arthroscopy     Family History  Problem Relation Age of Onset  . Asthma Other   . Diabetes Other   . Hypertension Other     History  Substance Use Topics  . Smoking status: Current Everyday Smoker -- 0.5 packs/day for 5 years    Types: Cigarettes  . Smokeless tobacco: Not on file  . Alcohol Use: No     OCASSIONALLY    OB History    Grav Para Term Preterm Abortions TAB SAB Ect Mult Living                  Review of Systems  Constitutional: Negative for fever.  Skin: Positive for color change. Negative for rash and wound.    Allergies  Lithium; Milk-related compounds; and Aripiprazole  Home Medications   Current Outpatient Rx  Name Route Sig Dispense Refill  . POTASSIUM CHLORIDE ER 10 MEQ PO TBCR Oral Take  10 mEq by mouth 2 (two) times daily.    . TRAZODONE HCL 50 MG PO TABS Oral Take 50 mg by mouth at bedtime.    . ALBUTEROL SULFATE HFA 108 (90 BASE) MCG/ACT IN AERS Inhalation Inhale 2 puffs into the lungs every 6 (six) hours as needed.    . CARBAMAZEPINE 200 MG PO TABS Oral Take 200 mg by mouth QID. Take 1 tablet in the morning and 2 tablets in the evening    . FUROSEMIDE 20 MG PO TABS Oral Take 2 tablets (40 mg total) by mouth daily. 30 tablet 0  . GABAPENTIN 300 MG PO CAPS Oral Take 300 mg by mouth 2 (two) times daily with a meal.    . IBUPROFEN 200 MG PO TABS Oral Take 800 mg by mouth every 8 (eight) hours as needed.  For pain    . LISINOPRIL 2.5 MG PO TABS Oral Take 2.5 mg by mouth daily.    Marland Kitchen METFORMIN HCL 500 MG PO TABS Oral Take 1,000 mg by mouth 2 (two) times daily with a meal.       BP 105/58  Pulse 74  Temp(Src) 98 F (36.7 C) (Oral)  Resp 18  SpO2 98%  LMP 09/25/2011  Physical Exam  Nursing note and vitals reviewed. Constitutional: She is oriented to person, place, and time. She appears well-developed and well-nourished. No distress.  HENT:  Head: Normocephalic and atraumatic.  Eyes: Conjunctivae and EOM are normal.  Neck: Normal range of motion.  Cardiovascular: Normal rate.   Pulmonary/Chest: Effort normal.  Abdominal: She exhibits no distension.  Musculoskeletal: Normal range of motion. She exhibits edema.       Bilateral lower extremity swelling. 1+ pitting edema. Varicose veins present. Erythema to mid calves bilaterally. No increased temperature.  No swelling, erythema of feet. Skin intact between toes, feet, legs. Sensation grossly intact. DP 1+. Marked area of erythema with marker for reference  Neurological: She is alert and oriented to person, place, and time.  Skin: Skin is warm and dry.  Psychiatric: She has a normal mood and affect. Her behavior is normal. Judgment and thought content normal.    ED Course  Procedures (including critical care time)  Labs Reviewed  POCT I-STAT, CHEM 8 - Abnormal; Notable for the following:    Glucose, Bld 155 (*)    All other components within normal limits   No results found.   1. Peripheral edema    Results for orders placed during the hospital encounter of 10/26/11  POCT I-STAT, CHEM 8      Component Value Range   Sodium 138  135 - 145 (mEq/L)   Potassium 4.2  3.5 - 5.1 (mEq/L)   Chloride 106  96 - 112 (mEq/L)   BUN 11  6 - 23 (mg/dL)   Creatinine, Ser 0.60  0.50 - 1.10 (mg/dL)   Glucose, Bld 155 (*) 70 - 99 (mg/dL)   Calcium, Ion 1.20  1.12 - 1.32 (mmol/L)   TCO2 22  0 - 100 (mmol/L)   Hemoglobin 12.9  12.0 - 15.0 (g/dL)    HCT 38.0  36.0 - 46.0 (%)   MDM  Previous records reviewed. Patient had a normal 2D echo on 08/24/11. EF 50-55%.   Skin intact . Afebrile here, no signs of infection at this time. Exam is most consistent with venous congestion/perpheral edema. marked area of erythema with marker. Will check electrolytes, increase her Lasix if kidney function and potassium were normal, have her start TED  hose and elevation.   Labs reviewed. Increasing Lasix to 20 mg twice a day for the next 5 days. Discussed lab findings, MDM, and plan with patient. Will have her return for any signs of infection. She agrees with plan.   Cherly Beach, MD 10/26/11 2155

## 2011-10-26 NOTE — ED Notes (Signed)
Asked to attempt blood draw by stasha, phlebotomy.  Butterfly stick to left/lateral ac x 1 .dressing applied.  Tolerated well.

## 2011-10-26 NOTE — Discharge Instructions (Signed)
Wear the compression has all times. Put them on in the morning, when the swelling is placed. Take the Lasix 40 mg a day history discussed for the next 5-7 days,  Then discontinue it. He may decrease to 20 mg a day if you're swelling gets better before then. Return if you've a fever above 100.4, if the redness extends beyond the lines we drew today, or any other concerns.

## 2011-10-26 NOTE — ED Notes (Signed)
PT HERE WITH C/O BILAT SWELLING IN LEGS/ANKLES BUT MORE IN R THAT STARTED Tuesday.STATES THE [facility] SEEN HERE AND TOLD HER TO COME HERE FOR EVALUATION FOR INFECTION OR POSS INCREASE OF LASIX.HX DIABETES,NEUROPATHY.EDEMA NOTED ON R LEG BUT COOL TO TOUCH.PAIN WITH WALKING.PT TAKES LASIX 20MG  DAILY

## 2011-11-08 ENCOUNTER — Encounter (HOSPITAL_COMMUNITY): Payer: Self-pay

## 2011-11-08 ENCOUNTER — Emergency Department (INDEPENDENT_AMBULATORY_CARE_PROVIDER_SITE_OTHER)
Admission: EM | Admit: 2011-11-08 | Discharge: 2011-11-08 | Disposition: A | Discharge status: Home / Self Care | Payer: Self-pay | Source: Home / Self Care | Attending: Emergency Medicine | Admitting: Emergency Medicine

## 2011-11-08 DIAGNOSIS — J45909 Unspecified asthma, uncomplicated: Secondary | ICD-10-CM

## 2011-11-08 MED ORDER — DEXAMETHASONE SODIUM PHOSPHATE 10 MG/ML IJ SOLN
16.0000 mg | Freq: Once | INTRAMUSCULAR | Status: AC
  Administered 2011-11-08: 16 mg

## 2011-11-08 MED ORDER — ALBUTEROL SULFATE (5 MG/ML) 0.5% IN NEBU: 5.0000 mg | INHALATION_SOLUTION | RESPIRATORY_TRACT | Status: DC | PRN

## 2011-11-08 MED ORDER — DEXAMETHASONE 1 MG/ML PO CONC
ORAL | Status: AC
  Filled 2011-11-08: qty 16

## 2011-11-08 MED ORDER — IPRATROPIUM BROMIDE 0.02 % IN SOLN
0.5000 mg | Freq: Once | RESPIRATORY_TRACT | Status: AC
  Administered 2011-11-08: 0.5 mg via RESPIRATORY_TRACT

## 2011-11-08 MED ORDER — DEXAMETHASONE SODIUM PHOSPHATE 10 MG/ML IJ SOLN
INTRAMUSCULAR | Status: AC
  Filled 2011-11-08: qty 2

## 2011-11-08 MED ORDER — DEXAMETHASONE 4 MG PO TABS: ORAL_TABLET | ORAL | Status: AC

## 2011-11-08 MED ORDER — ALBUTEROL SULFATE (5 MG/ML) 0.5% IN NEBU
5.0000 mg | INHALATION_SOLUTION | Freq: Once | RESPIRATORY_TRACT | Status: AC
  Administered 2011-11-08: 5 mg via RESPIRATORY_TRACT

## 2011-11-08 MED ORDER — ALBUTEROL SULFATE HFA 108 (90 BASE) MCG/ACT IN AERS: 2.0000 | INHALATION_SPRAY | Freq: Four times a day (QID) | RESPIRATORY_TRACT | Status: DC | PRN

## 2011-11-08 MED ORDER — AZITHROMYCIN 250 MG PO TABS: 250.0000 mg | ORAL_TABLET | Freq: Once | ORAL | Status: AC

## 2011-11-08 MED ORDER — GUAIFENESIN-CODEINE 100-10 MG/5ML PO SYRP: 5.0000 mL | ORAL_SOLUTION | Freq: Four times a day (QID) | ORAL | Status: AC | PRN

## 2011-11-08 MED ORDER — ALBUTEROL SULFATE (5 MG/ML) 0.5% IN NEBU
INHALATION_SOLUTION | RESPIRATORY_TRACT | Status: AC
  Filled 2011-11-08: qty 1

## 2011-11-08 NOTE — ED Notes (Signed)
Peak flow values: Pre treatment 200/275/250 Post treatment 240/250/300

## 2011-11-08 NOTE — Discharge Instructions (Signed)
Take two puffs from your albuterol inhaler every 4 hours. Finish the steroids unless your doctor tells you to stop. Finish the antibiotics, even if you feel better. Do a peak flow, once the morning and once at night. Write this down. The number should be going up, not down. You may decrease the frequency of your albuterol inhaler as the numbers go up and you start feeling better. You may start the steroids tomorrow, as you have already taken today's dose. You may take tylenol 1 gram up to 4 times a day as needed for pain. This with the naprosyn is an effective combination for pain and fever. Make sure you drink extra fluids. Return if you get worse, have a fever >100.4, or any other concerns.   Go to www.goodrx.com to look up your medications. This will give you a list of where you can find your prescriptions at the most affordable prices.
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
## 2012-02-05 ENCOUNTER — Emergency Department (HOSPITAL_COMMUNITY)
Admission: EM | Admit: 2012-02-05 | Discharge: 2012-02-05 | Disposition: A | Discharge status: Home / Self Care | Payer: Self-pay | Attending: Emergency Medicine | Admitting: Emergency Medicine

## 2012-02-05 ENCOUNTER — Encounter (HOSPITAL_COMMUNITY): Payer: Self-pay

## 2012-02-05 DIAGNOSIS — J4489 Other specified chronic obstructive pulmonary disease: Secondary | ICD-10-CM | POA: Insufficient documentation

## 2012-02-05 DIAGNOSIS — I10 Essential (primary) hypertension: Secondary | ICD-10-CM | POA: Insufficient documentation

## 2012-02-05 DIAGNOSIS — F17200 Nicotine dependence, unspecified, uncomplicated: Secondary | ICD-10-CM | POA: Insufficient documentation

## 2012-02-05 DIAGNOSIS — E669 Obesity, unspecified: Secondary | ICD-10-CM | POA: Insufficient documentation

## 2012-02-05 DIAGNOSIS — R569 Unspecified convulsions: Secondary | ICD-10-CM

## 2012-02-05 DIAGNOSIS — G40909 Epilepsy, unspecified, not intractable, without status epilepticus: Secondary | ICD-10-CM | POA: Insufficient documentation

## 2012-02-05 DIAGNOSIS — E119 Type 2 diabetes mellitus without complications: Secondary | ICD-10-CM | POA: Insufficient documentation

## 2012-02-05 DIAGNOSIS — F429 Obsessive-compulsive disorder, unspecified: Secondary | ICD-10-CM | POA: Insufficient documentation

## 2012-02-05 DIAGNOSIS — F319 Bipolar disorder, unspecified: Secondary | ICD-10-CM | POA: Insufficient documentation

## 2012-02-05 DIAGNOSIS — J449 Chronic obstructive pulmonary disease, unspecified: Secondary | ICD-10-CM | POA: Insufficient documentation

## 2012-02-05 DIAGNOSIS — Z79899 Other long term (current) drug therapy: Secondary | ICD-10-CM | POA: Insufficient documentation

## 2012-02-05 DIAGNOSIS — F411 Generalized anxiety disorder: Secondary | ICD-10-CM | POA: Insufficient documentation

## 2012-02-05 HISTORY — DX: Depression, unspecified: F32.A

## 2012-02-05 HISTORY — DX: Anxiety disorder, unspecified: F41.9

## 2012-02-05 HISTORY — DX: Major depressive disorder, single episode, unspecified: F32.9

## 2012-02-05 LAB — BASIC METABOLIC PANEL
BUN: 11 mg/dL (ref 6–23)
CO2: 24 mEq/L (ref 19–32)
Calcium: 8.5 mg/dL (ref 8.4–10.5)
Chloride: 97 mEq/L (ref 96–112)
Creatinine, Ser: 0.58 mg/dL (ref 0.50–1.10)
GFR calc Af Amer: >90 mL/min (ref >90)
GFR calc non Af Amer: >90 mL/min (ref >90)
Glucose, Bld: 181 mg/dL — ABNORMAL HIGH (ref 70–99)
Potassium: 3.9 mEq/L (ref 3.5–5.1)
Sodium: 134 mEq/L — ABNORMAL LOW (ref 135–145)

## 2012-02-05 LAB — URINALYSIS, ROUTINE W REFLEX MICROSCOPIC
Bilirubin Urine: NEGATIVE
Glucose, UA: NEGATIVE mg/dL
Hgb urine dipstick: NEGATIVE
Ketones, ur: NEGATIVE mg/dL
Leukocytes, UA: NEGATIVE
Nitrite: NEGATIVE
Protein, ur: NEGATIVE mg/dL
Specific Gravity, Urine: 1.013 (ref 1.005–1.030)
Urobilinogen, UA: 0.2 mg/dL (ref 0.0–1.0)
pH: 6 (ref 5.0–8.0)

## 2012-02-05 LAB — CARBAMAZEPINE LEVEL, TOTAL: Carbamazepine Lvl: 8.8 ug/mL (ref 4.0–12.0)

## 2012-02-05 LAB — PREGNANCY, URINE: Preg Test, Ur: NEGATIVE

## 2012-02-05 NOTE — ED Notes (Signed)
[id number] Expected date:02/05/12<BR> Expected time:10:09 AM<BR> Means of arrival:Ambulance<BR> Comments:<BR> 46yoF, seizure

## 2012-02-05 NOTE — ED Notes (Signed)
Diet coke and sandwhich given

## 2012-02-05 NOTE — ED Provider Notes (Signed)
History    46 year old female with seizure-like activity. Patient was at the [employer] where she had a witnessed seizure-like activity. Patient says she was cognizant was going on but cannot control her whole body shaking. Currently no complaints. She feels like this episode lasted 2-3 minutes. Patient reports a history of seizure with similar activity in the past. 2 fevers or chills. No tongue biting or incontinence. Patient has a history of psychiatric disorder which he takes carbamazepine before. She denies taking this for specifically seizures. CSN: [id number]  Arrival date & time 02/05/12  1018   First MD Initiated Contact with Patient 02/05/12 1024      Chief Complaint  Patient presents with  . Seizures    (Consider location/radiation/quality/duration/timing/severity/associated sxs/prior treatment) HPI  Past Medical History  Diagnosis Date  . Diabetes mellitus   . Asthma   . Arthritis   . Hypertension   . Epilepsy   . Seizures   . Thyroid disease   . COPD (chronic obstructive pulmonary disease)   . Bipolar 1 disorder   . Peripheral edema   . OCD (obsessive compulsive disorder)   . COPD (chronic obstructive pulmonary disease)   . Depression   . Anxiety     Past Surgical History  Procedure Date  . Tonsillectomy   . Knee arthroscopy     Family History  Problem Relation Age of Onset  . Asthma Other   . Diabetes Other   . Hypertension Other     History  Substance Use Topics  . Smoking status: Current Every Day Smoker -- 0.5 packs/day for 5 years    Types: Cigarettes  . Smokeless tobacco: Not on file  . Alcohol Use: No     OCASSIONALLY    OB History    Grav Para Term Preterm Abortions TAB SAB Ect Mult Living                  Review of Systems   Review of symptoms negative unless otherwise noted in HPI.   Allergies  Lithium; Milk-related compounds; and Aripiprazole  Home Medications   Current Outpatient Rx  Name Route Sig Dispense Refill    . ALBUTEROL SULFATE HFA 108 (90 BASE) MCG/ACT IN AERS Inhalation Inhale 2 puffs into the lungs every 6 (six) hours as needed for wheezing or shortness of breath. 1 Inhaler 2  . ALBUTEROL SULFATE (5 MG/ML) 0.5% IN NEBU Nebulization Take 1 mL (5 mg total) by nebulization every 4 (four) hours as needed for wheezing or shortness of breath. 100 vial 0  . CARBAMAZEPINE 200 MG PO TABS Oral Take 200 mg by mouth QID. Take 1 tablet in the morning and 2 tablets in the evening    . FUROSEMIDE 20 MG PO TABS Oral Take 20 mg by mouth daily.    Marland Kitchen GABAPENTIN 300 MG PO CAPS Oral Take 300 mg by mouth 2 (two) times daily with a meal.    . GLIPIZIDE 5 MG PO TABS Oral Take 5 mg by mouth 2 (two) times daily before a meal.    . LISINOPRIL 2.5 MG PO TABS Oral Take 2.5 mg by mouth daily.    Marland Kitchen METFORMIN HCL 500 MG PO TABS Oral Take 1,000 mg by mouth 2 (two) times daily with a meal.     . NAPROXEN 500 MG PO TABS Oral Take 500 mg by mouth 2 (two) times daily with a meal.    . POTASSIUM CHLORIDE ER 10 MEQ PO TBCR Oral Take 10 mEq  by mouth 2 (two) times daily.    . TRAZODONE HCL 50 MG PO TABS Oral Take 50 mg by mouth at bedtime.      BP 107/47  Pulse 75  Temp 97.7 F (36.5 C) (Oral)  Resp 14  SpO2 95%  Physical Exam  Nursing note and vitals reviewed. Constitutional: She is oriented to person, place, and time. She appears well-developed and well-nourished. No distress.       Laying in bed. No acute distress. Obese.  HENT:  Head: Normocephalic and atraumatic.  Eyes: Conjunctivae normal are normal. Right eye exhibits no discharge. Left eye exhibits no discharge.  Neck: Neck supple.  Cardiovascular: Normal rate, regular rhythm and normal heart sounds.  Exam reveals no gallop and no friction rub.   No murmur heard. Pulmonary/Chest: Effort normal and breath sounds normal. No respiratory distress.  Abdominal: Soft. She exhibits no distension. There is no tenderness.  Musculoskeletal: She exhibits no edema and no  tenderness.  Neurological: She is alert and oriented to person, place, and time. No cranial nerve deficit. She exhibits normal muscle tone. Coordination normal.       Good finger to nose testing bilaterally. Gait is steady.  Skin: Skin is warm and dry.  Psychiatric: She has a normal mood and affect. Her behavior is normal. Thought content normal.    ED Course  Procedures (including critical care time)  Labs Reviewed - No data to display No results found.   1. Seizure       MDM  46 year old female with seizure-like activity. She is back to her baseline. She has a nonfocal neurological examination. She is on carbamazepine but she says this is not for seizures. Regardless, this level is in the therapeutic range. Electrolytes are normal. Patient is afebrile and hemodynamically stable. There is no reported trauma. Feel she is safe for discharge at this time. Return precautions were discussed. Outpatient followup otherwise.        Virgel Manifold, MD 02/16/12 1058

## 2012-02-05 NOTE — Progress Notes (Signed)
[facility] ED CM noted previous [facility] MD, Ama[facility], listed.  Spoke with pt who states she presently does not have a pcp but is being followed by Landry Mellow RN and prefers to wait on finding a pcp.  Pt and CM discussed her previously being a [facility] patient Pt states she has her medical records and is doing well with getting her medications. CM reviewed list of pcps recommended by [facility] (provided written list -placed in pt medication box per her permission)

## 2012-02-05 NOTE — ED Notes (Signed)
Pt was picked up at [facility] and lives in shelter currently homeless, reported having a seizure witnessed by [employer] [facility], pt was alert, oriented and actively communicating during her seizure. Previous hx of same. GCS 15 right now in room. CBG 198 by EMS. No tongue injury, no incontience noted. Pt claim herself as a healthcare provider. Only complaint is a headache

## 2012-03-14 ENCOUNTER — Encounter (HOSPITAL_COMMUNITY): Payer: Self-pay | Admitting: Emergency Medicine

## 2012-03-14 ENCOUNTER — Emergency Department (INDEPENDENT_AMBULATORY_CARE_PROVIDER_SITE_OTHER)
Admission: EM | Admit: 2012-03-14 | Discharge: 2012-03-14 | Disposition: A | Discharge status: Home / Self Care | Payer: Self-pay | Source: Home / Self Care | Attending: Emergency Medicine | Admitting: Emergency Medicine

## 2012-03-14 DIAGNOSIS — R197 Diarrhea, unspecified: Secondary | ICD-10-CM

## 2012-03-14 DIAGNOSIS — M5416 Radiculopathy, lumbar region: Secondary | ICD-10-CM

## 2012-03-14 DIAGNOSIS — IMO0002 Reserved for concepts with insufficient information to code with codable children: Secondary | ICD-10-CM

## 2012-03-14 LAB — POCT URINALYSIS DIP (DEVICE)
Glucose, UA: NEGATIVE mg/dL
Hgb urine dipstick: NEGATIVE
Nitrite: NEGATIVE
Urobilinogen, UA: 0.2 mg/dL (ref 0.0–1.0)
pH: 5.5 (ref 5.0–8.0)

## 2012-03-14 MED ORDER — TIZANIDINE HCL 4 MG PO TABS: 4.0000 mg | ORAL_TABLET | Freq: Four times a day (QID) | ORAL | Status: DC | PRN

## 2012-03-14 MED ORDER — METRONIDAZOLE 500 MG PO TABS: 500.0000 mg | ORAL_TABLET | Freq: Three times a day (TID) | ORAL | Status: DC

## 2012-03-14 NOTE — ED Provider Notes (Signed)
Chief Complaint  Patient presents with  . Back Pain  . Leg Pain    History of Present Illness:   The patient is a 46 year old female who presents with 2 complaints: Lower back pain and explosive diarrhea.  Her lower back pain has been going on for about 5 days. She denies any injury. The pain is localized to the left lower back and radiates down the left leg as far as the ankle. It's worse if she walks and better if she lies flat on her back with her legs elevated she has neuropathy, so she always has numbness of both of her feet. Her left leg feels weak and sometimes it goes out. The pain is rated as a 10-11/10 in intensity. She denies any bladder incontinence or saddle anesthesia.  She also has a 2 month history of explosive diarrhea. These occur about 30 minutes after a meal. She has tried Prilosec without relief. She describes 5-10 bowel movements per day with occasional blood which she attributes to hemorrhoids. Her abdomen has been swelling and her weight has been up. She's noted excessive gas, she has felt nauseated, and she's vomited twice in the past 2 months. She also has abdominal pain in both flanks radiating towards the umbilicus. This is rated a 9-10 over 10 in intensity. It comes and goes and nothing makes it better or worse. It feels like she's been kicked in the stomach.  She also has a history of GERD, diabetes, low blood pressure, arthritis, carpal tunnel syndrome, and seizure disorder. She is seeing a [employer], [employer] had tried adult and pediatric medicine weekly.  Review of Systems:  Other than noted above, the patient denies any of the following symptoms: Systemic:  No fever, chills, severe fatigue, or unexplained weight loss. GI:  No abdominal pain, nausea, vomiting, diarrhea, constipation, incontinence of bowel, or blood in stool. GU:  No dysuria, frequency, urgency, or hematuria. No incontinence of urine or difficulty urinating.  M-S:  No neck pain, joint pain, arthritis,  or myalgias. Neuro:  No paresthesias, saddle anesthesia, muscular weakness, or progressive neurological deficit.  [place]:  Past medical history, family history, social history, meds, and allergies were reviewed. Specifically, there is no history of cancer, major trauma, osteoporosis, immunosuppression, HIV, or IV or injection drug use.   Physical Exam:   Vital signs:  BP 117/56  Pulse 78  Temp 98.7 F (37.1 C) (Oral)  Resp 16  SpO2 96%  LMP 03/14/2012 General:  Alert, oriented, in no distress. Abdomen:  Soft, non-tender.  No organomegaly or mass.  No pulsatile midline abdominal mass or bruit. Back:  The lower back shows tenderness to palpation in the left lumbar area. Her back has a fairly good range of motion with 85 of flexion with pain. Straight leg raising is positive on the left and negative on the right. Neuro:  Normal muscle strength, sensations and absent DTRs. Extremities: Pedal pulses were full, there was no edema. Skin:  Clear, warm and dry.  No rash.  Labs:   Results for orders placed during the hospital encounter of 03/14/12  POCT URINALYSIS DIP (DEVICE)      Component Value Range   Glucose, UA NEGATIVE  NEGATIVE mg/dL   Bilirubin Urine NEGATIVE  NEGATIVE   Ketones, ur NEGATIVE  NEGATIVE mg/dL   Specific Gravity, Urine 1.025  1.005 - 1.030   Hgb urine dipstick NEGATIVE  NEGATIVE   pH 5.5  5.0 - 8.0   Protein, ur NEGATIVE  NEGATIVE mg/dL  Urobilinogen, UA 0.2  0.0 - 1.0 mg/dL   Nitrite NEGATIVE  NEGATIVE   Leukocytes, UA NEGATIVE  NEGATIVE     Assessment:  The primary encounter diagnosis was Lumbar radiculopathy. A diagnosis of Diarrhea was also pertinent to this visit.  For the lumbar radiculopathy, I advised seeing an orthopedist or neurosurgeon. Since she has the orange card, she will have to be referred by her primary care provider. In the meantime, I don't want to give her any strong pain medication. Tramadol might cause a seizure, anti-inflammatories might  make her GI symptoms worse, and steroids might make her diabetes worse. I did give her prescription for Zanaflex.  The diarrhea could be due to infectious causes, Clostridium difficile, inflammatory bowel disease, or irritable bowel syndrome. I referred her to see Dr. Collene Mares. In the meantime I like to try a course of metronidazole. I tried to get stool specimens for C. difficile and culture, but she was not able to give one, and since she is homeless and living in a homeless shelter, we'll not be able to provide one can get here anatomic manner.  Plan:   1.  The following meds were prescribed:   New Prescriptions   METRONIDAZOLE (FLAGYL) 500 MG TABLET    Take 1 tablet (500 mg total) by mouth 3 (three) times daily.   TIZANIDINE (ZANAFLEX) 4 MG TABLET    Take 1 tablet (4 mg total) by mouth every 6 (six) hours as needed.   2.  The patient was instructed in symptomatic care and handouts were given. 3.  The patient was told to return if becoming worse in any way, if no better in 2 weeks, and given some red flag symptoms that would indicate earlier return. 4.  The patient was encouraged to try to be as active as possible and given some exercises to do followed by moist heat.    Harden Mo, MD 03/14/12 2118

## 2012-03-14 NOTE — ED Notes (Signed)
Pt c/o left leg and hip pain x 4 days. Most pain felt with weight bearing. Denies injury.  Pt c/o of back pain at bra line that radiates around to abdomen and explosive diarrhea x  2 months.  Pt states that she takes naproxen every day and it's not helping with the pain at all.  Pt denies fever, n/v.

## 2012-03-14 NOTE — ED Notes (Signed)
Pt giving stool sample and then will be discharged. Mw,cma

## 2012-05-02 ENCOUNTER — Other Ambulatory Visit: Payer: Self-pay

## 2012-05-02 DIAGNOSIS — J449 Chronic obstructive pulmonary disease, unspecified: Secondary | ICD-10-CM

## 2012-05-03 ENCOUNTER — Ambulatory Visit (HOSPITAL_COMMUNITY)
Admission: RE | Admit: 2012-05-03 | Discharge: 2012-05-03 | Disposition: A | Discharge status: Home / Self Care | Payer: No Typology Code available for payment source | Source: Ambulatory Visit | Attending: Family Medicine | Admitting: Family Medicine

## 2012-05-03 DIAGNOSIS — J4489 Other specified chronic obstructive pulmonary disease: Secondary | ICD-10-CM | POA: Insufficient documentation

## 2012-05-03 DIAGNOSIS — J449 Chronic obstructive pulmonary disease, unspecified: Secondary | ICD-10-CM | POA: Insufficient documentation

## 2012-05-03 MED ORDER — ALBUTEROL SULFATE (5 MG/ML) 0.5% IN NEBU
2.5000 mg | INHALATION_SOLUTION | Freq: Once | RESPIRATORY_TRACT | Status: AC
  Administered 2012-05-03: 2.5 mg via RESPIRATORY_TRACT

## 2012-10-22 ENCOUNTER — Other Ambulatory Visit (HOSPITAL_COMMUNITY): Payer: Self-pay | Admitting: Internal Medicine

## 2012-10-22 DIAGNOSIS — R109 Unspecified abdominal pain: Secondary | ICD-10-CM

## 2012-10-29 ENCOUNTER — Other Ambulatory Visit (HOSPITAL_COMMUNITY): Payer: Self-pay | Admitting: Internal Medicine

## 2012-10-29 ENCOUNTER — Encounter (HOSPITAL_COMMUNITY): Payer: Self-pay

## 2012-10-29 ENCOUNTER — Ambulatory Visit (HOSPITAL_COMMUNITY)
Admission: RE | Admit: 2012-10-29 | Discharge: 2012-10-29 | Disposition: A | Discharge status: Home / Self Care | Payer: No Typology Code available for payment source | Source: Ambulatory Visit | Attending: Internal Medicine | Admitting: Internal Medicine

## 2012-10-29 DIAGNOSIS — R109 Unspecified abdominal pain: Secondary | ICD-10-CM

## 2012-10-29 DIAGNOSIS — K3189 Other diseases of stomach and duodenum: Secondary | ICD-10-CM | POA: Insufficient documentation

## 2012-10-29 DIAGNOSIS — K429 Umbilical hernia without obstruction or gangrene: Secondary | ICD-10-CM | POA: Insufficient documentation

## 2012-10-29 DIAGNOSIS — R143 Flatulence: Secondary | ICD-10-CM | POA: Insufficient documentation

## 2012-10-29 DIAGNOSIS — R1013 Epigastric pain: Secondary | ICD-10-CM | POA: Insufficient documentation

## 2012-10-29 DIAGNOSIS — R197 Diarrhea, unspecified: Secondary | ICD-10-CM | POA: Insufficient documentation

## 2012-10-29 DIAGNOSIS — R142 Eructation: Secondary | ICD-10-CM | POA: Insufficient documentation

## 2012-10-29 DIAGNOSIS — R141 Gas pain: Secondary | ICD-10-CM | POA: Insufficient documentation

## 2012-10-29 LAB — CREATININE, SERUM: Creatinine, Ser: 0.68 mg/dL (ref 0.50–1.10)

## 2012-10-29 MED ORDER — IOHEXOL 300 MG/ML  SOLN
100.0000 mL | Freq: Once | INTRAMUSCULAR | Status: AC | PRN
  Administered 2012-10-29: 100 mL via INTRAVENOUS

## 2012-12-29 ENCOUNTER — Encounter (HOSPITAL_COMMUNITY): Payer: Self-pay | Admitting: Emergency Medicine

## 2012-12-29 ENCOUNTER — Emergency Department (HOSPITAL_COMMUNITY)
Admission: EM | Admit: 2012-12-29 | Discharge: 2012-12-29 | Disposition: A | Discharge status: Home / Self Care | Payer: No Typology Code available for payment source | Attending: Emergency Medicine | Admitting: Emergency Medicine

## 2012-12-29 DIAGNOSIS — J449 Chronic obstructive pulmonary disease, unspecified: Secondary | ICD-10-CM | POA: Insufficient documentation

## 2012-12-29 DIAGNOSIS — I10 Essential (primary) hypertension: Secondary | ICD-10-CM | POA: Insufficient documentation

## 2012-12-29 DIAGNOSIS — F17200 Nicotine dependence, unspecified, uncomplicated: Secondary | ICD-10-CM | POA: Insufficient documentation

## 2012-12-29 DIAGNOSIS — Z8739 Personal history of other diseases of the musculoskeletal system and connective tissue: Secondary | ICD-10-CM | POA: Insufficient documentation

## 2012-12-29 DIAGNOSIS — F429 Obsessive-compulsive disorder, unspecified: Secondary | ICD-10-CM | POA: Insufficient documentation

## 2012-12-29 DIAGNOSIS — M549 Dorsalgia, unspecified: Secondary | ICD-10-CM | POA: Insufficient documentation

## 2012-12-29 DIAGNOSIS — Z791 Long term (current) use of non-steroidal anti-inflammatories (NSAID): Secondary | ICD-10-CM | POA: Insufficient documentation

## 2012-12-29 DIAGNOSIS — M5430 Sciatica, unspecified side: Secondary | ICD-10-CM | POA: Insufficient documentation

## 2012-12-29 DIAGNOSIS — M5431 Sciatica, right side: Secondary | ICD-10-CM

## 2012-12-29 DIAGNOSIS — E119 Type 2 diabetes mellitus without complications: Secondary | ICD-10-CM | POA: Insufficient documentation

## 2012-12-29 DIAGNOSIS — J4489 Other specified chronic obstructive pulmonary disease: Secondary | ICD-10-CM | POA: Insufficient documentation

## 2012-12-29 DIAGNOSIS — E079 Disorder of thyroid, unspecified: Secondary | ICD-10-CM | POA: Insufficient documentation

## 2012-12-29 DIAGNOSIS — Z79899 Other long term (current) drug therapy: Secondary | ICD-10-CM | POA: Insufficient documentation

## 2012-12-29 DIAGNOSIS — F319 Bipolar disorder, unspecified: Secondary | ICD-10-CM | POA: Insufficient documentation

## 2012-12-29 DIAGNOSIS — G40909 Epilepsy, unspecified, not intractable, without status epilepticus: Secondary | ICD-10-CM | POA: Insufficient documentation

## 2012-12-29 MED ORDER — DIAZEPAM 5 MG PO TABS
5.0000 mg | ORAL_TABLET | Freq: Once | ORAL | Status: AC
  Administered 2012-12-29: 5 mg via ORAL
  Filled 2012-12-29: qty 1

## 2012-12-29 MED ORDER — HYDROMORPHONE HCL PF 1 MG/ML IJ SOLN
1.0000 mg | Freq: Once | INTRAMUSCULAR | Status: AC
  Administered 2012-12-29: 1 mg via INTRAMUSCULAR
  Filled 2012-12-29: qty 1

## 2012-12-29 MED ORDER — PREDNISONE 20 MG PO TABS
60.0000 mg | ORAL_TABLET | Freq: Once | ORAL | Status: AC
  Administered 2012-12-29: 60 mg via ORAL
  Filled 2012-12-29: qty 3

## 2012-12-29 NOTE — ED Provider Notes (Signed)
CSN: [id number]     Arrival date & time 12/29/12  1945 History     First MD Initiated Contact with Patient 12/29/12 1947     Chief Complaint  Patient presents with  . Sciatica   (Consider location/radiation/quality/duration/timing/severity/associated sxs/prior Treatment) HPI Comments: 47 year old obese female with a an extensive past medical history including bipolar disorder, depression, anxiety, diabetes, hypertension and pseudoseizures presents the emergency department via EMS with her son complaining of back pain beginning 2 days ago. Patient states she was mopping the bathroom floor when she backed up and hit the right side of her back onto a door causing her sciatica to flareup. Describes the pain as "intense" rated 10 out of 10, radiating down her right leg. Nothing in specific makes the pain worse or better. She has tried taking ibuprofen without relief. Denies numbness or tingling. No loss of control bowels or bladder or saddle anesthesia. She is very anxious and states it's too many people are present in the room she will have a pseudoseizure. Also states she cannot have a prescription for tramadol because her insurance would not cover it.  The history is provided by the patient and a relative.    Past Medical History  Diagnosis Date  . Diabetes mellitus   . Asthma   . Arthritis   . Hypertension   . Epilepsy   . Seizures   . Thyroid disease   . COPD (chronic obstructive pulmonary disease)   . Bipolar 1 disorder   . Peripheral edema   . OCD (obsessive compulsive disorder)   . COPD (chronic obstructive pulmonary disease)   . Depression   . Anxiety    Past Surgical History  Procedure Laterality Date  . Tonsillectomy    . Knee arthroscopy     Family History  Problem Relation Age of Onset  . Asthma Other   . Diabetes Other   . Hypertension Other    History  Substance Use Topics  . Smoking status: Current Every Day Smoker -- 0.50 packs/day for 5 years    Types:  Cigarettes  . Smokeless tobacco: Not on file  . Alcohol Use: No     Comment: OCASSIONALLY   OB History   Grav Para Term Preterm Abortions TAB SAB Ect Mult Living                 Review of Systems  Musculoskeletal: Positive for back pain.  Neurological: Negative for numbness.  All other systems reviewed and are negative.    Allergies  Lithium; Abilify; Milk-related compounds; and Aripiprazole  Home Medications   Current Outpatient Rx  Name  Route  Sig  Dispense  Refill  . albuterol (PROVENTIL HFA;VENTOLIN HFA) 108 (90 BASE) MCG/ACT inhaler   Inhalation   Inhale 2 puffs into the lungs every 6 (six) hours as needed. Breathing         . EXPIRED: albuterol (PROVENTIL) (5 MG/ML) 0.5% nebulizer solution   Nebulization   Take 5 mg by nebulization every 4 (four) hours as needed. Breathing         . carbamazepine (TEGRETOL) 200 MG tablet   Oral   Take 100-200 mg by mouth QID. Take 1 tablet in the morning, 1 tablet at 4 pm and 2 tablets at night         . furosemide (LASIX) 20 MG tablet   Oral   Take 20 mg by mouth daily.         Marland Kitchen gabapentin (NEURONTIN) 300 MG capsule  Oral   Take 300 mg by mouth 2 (two) times daily with a meal.         . glipiZIDE (GLUCOTROL) 5 MG tablet   Oral   Take 5 mg by mouth 2 (two) times daily before a meal.         . lisinopril (PRINIVIL,ZESTRIL) 2.5 MG tablet   Oral   Take 2.5 mg by mouth daily.         . metFORMIN (GLUCOPHAGE) 500 MG tablet   Oral   Take 1,000 mg by mouth 2 (two) times daily with a meal.          . metroNIDAZOLE (FLAGYL) 500 MG tablet   Oral   Take 1 tablet (500 mg total) by mouth 3 (three) times daily.   30 tablet   0   . naproxen (NAPROSYN) 500 MG tablet   Oral   Take 500 mg by mouth 2 (two) times daily with a meal.         . omeprazole (PRILOSEC) 20 MG capsule   Oral   Take 20 mg by mouth daily.         . potassium chloride (K-DUR) 10 MEQ tablet   Oral   Take 10 mEq by mouth 2 (two)  times daily.         Marland Kitchen tiZANidine (ZANAFLEX) 4 MG tablet   Oral   Take 1 tablet (4 mg total) by mouth every 6 (six) hours as needed.   30 tablet   0   . traZODone (DESYREL) 50 MG tablet   Oral   Take 50 mg by mouth at bedtime.          BP 127/84  Pulse 95  Temp(Src) 98.4 F (36.9 C) (Oral)  SpO2 98% Physical Exam  Nursing note and vitals reviewed. Constitutional: She is oriented to person, place, and time. She appears well-developed.  Obese  HENT:  Head: Normocephalic and atraumatic.  Mouth/Throat: Oropharynx is clear and moist.  Eyes: Conjunctivae are normal.  Neck: Normal range of motion. Neck supple.  Cardiovascular: Normal rate, regular rhythm, normal heart sounds and intact distal pulses.   Pulmonary/Chest: Effort normal and breath sounds normal. No respiratory distress.  Abdominal: Soft. Bowel sounds are normal. She exhibits no distension. There is no tenderness.  Musculoskeletal:       Back:  Tenderness to palpation of right paraspinal muscles as shown in diagram. No overlying bruising, ecchymosis, erythema or edema.  Neurological: She is alert and oriented to person, place, and time. She has normal strength. No sensory deficit.  Skin: Skin is warm and dry. She is not diaphoretic. No erythema.  Psychiatric: Her mood appears anxious. She is hyperactive.    ED Course   Procedures (including critical care time)  Labs Reviewed - No data to display No results found. 1. Back pain   2. Sciatica, right     MDM  Patient with right sided back pain s/p hitting a door. Hx of sciatica. No red flags concerning patient's back pain. No s/s of central cord compression or cauda equina. Lower extremities are neurovascularly intact and patient is ambulating without difficulty. No evidence of trauma. She was given Valium for muscle spasm and prednisone for her sciatica flare. 1mg  dilaudud for pain. Normal vital signs. Patient also seen by Dr. Reather Converse who agrees with plan of  care.  Illene Labrador, PA-C 12/29/12 2120

## 2012-12-29 NOTE — ED Notes (Addendum)
Per EMS:  A few days ago, pt was mopping and backed into a door which made her sciatica flare up.  Pt states that she can't get Tramadol due to insurance lack of coverage. Hx: siezure, DM, COPD, asthma, GERD, bipolar  CBG: 163, 128/74 BP, 90 HR, 20 R

## 2012-12-29 NOTE — ED Notes (Signed)
[id number] Expected date:<BR> Expected time:<BR> Means of arrival:<BR> Comments:<BR> EMS

## 2012-12-29 NOTE — Discharge Instructions (Signed)
You were given prednisone and a muscle relaxer today for your back pain and sciatica flare up. Follow up with your doctor. Rest, ice and apply heat. Take ibuprofen 800mg  every 8 hours for pain.  Back Pain, Adult Low back pain is very common. About 1 in 5 people have back pain.The cause of low back pain is rarely dangerous. The pain often gets better over time.About half of people with a sudden onset of back pain feel better in just 2 weeks. About 8 in 10 people feel better by 6 weeks.  CAUSES Some common causes of back pain include:  Strain of the muscles or ligaments supporting the spine.  Wear and tear (degeneration) of the spinal discs.  Arthritis.  Direct injury to the back. DIAGNOSIS Most of the time, the direct cause of low back pain is not known.However, back pain can be treated effectively even when the exact cause of the pain is unknown.Answering your caregiver's questions about your overall health and symptoms is one of the most accurate ways to make sure the cause of your pain is not dangerous. If your caregiver needs more information, he or she may order lab work or imaging tests (X-rays or MRIs).However, even if imaging tests show changes in your back, this usually does not require surgery. HOME CARE INSTRUCTIONS For many people, back pain returns.Since low back pain is rarely dangerous, it is often a condition that people can learn to [facility] their own.   Remain active. It is stressful on the back to sit or stand in one place. Do not sit, drive, or stand in one place for more than 30 minutes at a time. Take short walks on level surfaces as soon as pain allows.Try to increase the length of time you walk each day.  Do not stay in bed.Resting more than 1 or 2 days can delay your recovery.  Do not avoid exercise or work.Your body is made to move.It is not dangerous to be active, even though your back may hurt.Your back will likely heal faster if you return to being  active before your pain is gone.  Pay attention to your body when you bend and lift. Many people have less discomfortwhen lifting if they bend their knees, keep the load close to their bodies,and avoid twisting. Often, the most comfortable positions are those that put less stress on your recovering back.  Find a comfortable position to sleep. Use a firm mattress and lie on your side with your knees slightly bent. If you lie on your back, put a pillow under your knees.  Only take over-the-counter or prescription medicines as directed by your caregiver. Over-the-counter medicines to reduce pain and inflammation are often the most helpful.Your caregiver may prescribe muscle relaxant drugs.These medicines help dull your pain so you can more quickly return to your normal activities and healthy exercise.  Put ice on the injured area.  Put ice in a plastic bag.  Place a towel between your skin and the bag.  Leave the ice on for 15-20 minutes, 3-4 times a day for the first 2 to 3 days. After that, ice and heat may be alternated to reduce pain and spasms.  Ask your caregiver about trying back exercises and gentle massage. This may be of some benefit.  Avoid feeling anxious or stressed.Stress increases muscle tension and can worsen back pain.It is important to recognize when you are anxious or stressed and learn ways to manage it.Exercise is a great option. SEEK MEDICAL CARE IF:  You have pain that is not relieved with rest or medicine.  You have pain that does not improve in 1 week.  You have new symptoms.  You are generally not feeling well. SEEK IMMEDIATE MEDICAL CARE IF:   You have pain that radiates from your back into your legs.  You develop new bowel or bladder control problems.  You have unusual weakness or numbness in your arms or legs.  You develop nausea or vomiting.  You develop abdominal pain.  You feel faint. Document Released: 05/08/2005 Document Revised:  11/07/2011 Document Reviewed: 09/26/2010 [facility] Patient Information 2014 [place], [place].  Sciatica Sciatica is pain, weakness, numbness, or tingling along the path of the sciatic nerve. The nerve starts in the lower back and runs down the back of each leg. The nerve controls the muscles in the lower leg and in the back of the knee, while also providing sensation to the back of the thigh, lower leg, and the sole of your foot. Sciatica is a symptom of another medical condition. For instance, nerve damage or certain conditions, such as a herniated disk or bone spur on the spine, pinch or put pressure on the sciatic nerve. This causes the pain, weakness, or other sensations normally associated with sciatica. Generally, sciatica only affects one side of the body. CAUSES   Herniated or slipped disc.  Degenerative disk disease.  A pain disorder involving the narrow muscle in the buttocks (piriformis syndrome).  Pelvic injury or fracture.  Pregnancy.  Tumor (rare). SYMPTOMS  Symptoms can vary from mild to very severe. The symptoms usually travel from the low back to the buttocks and down the back of the leg. Symptoms can include:  Mild tingling or dull aches in the lower back, leg, or hip.  Numbness in the back of the calf or sole of the foot.  Burning sensations in the lower back, leg, or hip.  Sharp pains in the lower back, leg, or hip.  Leg weakness.  Severe back pain inhibiting movement. These symptoms may get worse with coughing, sneezing, laughing, or prolonged sitting or standing. Also, being overweight may worsen symptoms. DIAGNOSIS  Your caregiver will perform a physical exam to look for common symptoms of sciatica. He or she may ask you to do certain movements or activities that would trigger sciatic nerve pain. Other tests may be performed to find the cause of the sciatica. These may include:  Blood tests.  X-rays.  Imaging tests, such as an MRI or CT scan. TREATMENT    Treatment is directed at the cause of the sciatic pain. Sometimes, treatment is not necessary and the pain and discomfort goes away on its own. If treatment is needed, your caregiver may suggest:  Over-the-counter medicines to relieve pain.  Prescription medicines, such as anti-inflammatory medicine, muscle relaxants, or narcotics.  Applying heat or ice to the painful area.  Steroid injections to lessen pain, irritation, and inflammation around the nerve.  Reducing activity during periods of pain.  Exercising and stretching to strengthen your abdomen and improve flexibility of your spine. Your caregiver may suggest losing weight if the extra weight makes the back pain worse.  Physical therapy.  Surgery to eliminate what is pressing or pinching the nerve, such as a bone spur or part of a herniated disk. HOME CARE INSTRUCTIONS   Only take over-the-counter or prescription medicines for pain or discomfort as directed by your caregiver.  Apply ice to the affected area for 20 minutes, 3 4 times a day for  the first 48 72 hours. Then try heat in the same way.  Exercise, stretch, or perform your usual activities if these do not aggravate your pain.  Attend physical therapy sessions as directed by your caregiver.  Keep all follow-up appointments as directed by your caregiver.  Do not wear high heels or shoes that do not provide proper support.  Check your mattress to see if it is too soft. A firm mattress may lessen your pain and discomfort. SEEK IMMEDIATE MEDICAL CARE IF:   You lose control of your bowel or bladder (incontinence).  You have increasing weakness in the lower back, pelvis, buttocks, or legs.  You have redness or swelling of your back.  You have a burning sensation when you urinate.  You have pain that gets worse when you lie down or awakens you at night.  Your pain is worse than you have experienced in the past.  Your pain is lasting longer than 4 weeks.  You  are suddenly losing weight without reason. MAKE SURE YOU:  Understand these instructions.  Will watch your condition.  Will get help right away if you are not doing well or get worse. Document Released: 05/02/2001 Document Revised: 11/07/2011 Document Reviewed: 09/17/2011 [facility] Patient Information 2014 [place].

## 2012-12-30 NOTE — ED Provider Notes (Signed)
Medical screening examination/treatment/procedure(s) were conducted as a shared visit with non-physician practitioner(s) or resident  and myself.  I personally evaluated the patient during the encounter and agree with the findings and plan unless otherwise indicated.    No red flags, low risk injury.  Pain description consistent with sciatica.  Normal strength, 1+ reflexes, nl sensation in LE bilateral.  Supportive care in ED. Reasons to return discussed.   Mariea Clonts, MD 12/30/12 [place] [place]

## 2013-02-25 ENCOUNTER — Emergency Department (HOSPITAL_COMMUNITY): Payer: No Typology Code available for payment source

## 2013-02-25 ENCOUNTER — Encounter (HOSPITAL_COMMUNITY): Payer: Self-pay | Admitting: Nurse Practitioner

## 2013-02-25 ENCOUNTER — Emergency Department (HOSPITAL_COMMUNITY)
Admission: EM | Admit: 2013-02-25 | Discharge: 2013-02-25 | Disposition: A | Discharge status: Home / Self Care | Payer: No Typology Code available for payment source | Attending: Emergency Medicine | Admitting: Emergency Medicine

## 2013-02-25 DIAGNOSIS — W102XXA Fall (on)(from) incline, initial encounter: Secondary | ICD-10-CM

## 2013-02-25 DIAGNOSIS — Z79899 Other long term (current) drug therapy: Secondary | ICD-10-CM | POA: Insufficient documentation

## 2013-02-25 DIAGNOSIS — S025XXA Fracture of tooth (traumatic), initial encounter for closed fracture: Secondary | ICD-10-CM | POA: Insufficient documentation

## 2013-02-25 DIAGNOSIS — S0003XA Contusion of scalp, initial encounter: Secondary | ICD-10-CM | POA: Insufficient documentation

## 2013-02-25 DIAGNOSIS — S0083XA Contusion of other part of head, initial encounter: Secondary | ICD-10-CM

## 2013-02-25 DIAGNOSIS — Z8739 Personal history of other diseases of the musculoskeletal system and connective tissue: Secondary | ICD-10-CM | POA: Insufficient documentation

## 2013-02-25 DIAGNOSIS — G40909 Epilepsy, unspecified, not intractable, without status epilepticus: Secondary | ICD-10-CM | POA: Insufficient documentation

## 2013-02-25 DIAGNOSIS — F3289 Other specified depressive episodes: Secondary | ICD-10-CM | POA: Insufficient documentation

## 2013-02-25 DIAGNOSIS — S025XXB Fracture of tooth (traumatic), initial encounter for open fracture: Secondary | ICD-10-CM

## 2013-02-25 DIAGNOSIS — Y9289 Other specified places as the place of occurrence of the external cause: Secondary | ICD-10-CM | POA: Insufficient documentation

## 2013-02-25 DIAGNOSIS — Z8673 Personal history of transient ischemic attack (TIA), and cerebral infarction without residual deficits: Secondary | ICD-10-CM | POA: Insufficient documentation

## 2013-02-25 DIAGNOSIS — Y9301 Activity, walking, marching and hiking: Secondary | ICD-10-CM | POA: Insufficient documentation

## 2013-02-25 DIAGNOSIS — F411 Generalized anxiety disorder: Secondary | ICD-10-CM | POA: Insufficient documentation

## 2013-02-25 DIAGNOSIS — J4489 Other specified chronic obstructive pulmonary disease: Secondary | ICD-10-CM | POA: Insufficient documentation

## 2013-02-25 DIAGNOSIS — E119 Type 2 diabetes mellitus without complications: Secondary | ICD-10-CM | POA: Insufficient documentation

## 2013-02-25 DIAGNOSIS — I10 Essential (primary) hypertension: Secondary | ICD-10-CM | POA: Insufficient documentation

## 2013-02-25 DIAGNOSIS — F319 Bipolar disorder, unspecified: Secondary | ICD-10-CM | POA: Insufficient documentation

## 2013-02-25 DIAGNOSIS — F329 Major depressive disorder, single episode, unspecified: Secondary | ICD-10-CM | POA: Insufficient documentation

## 2013-02-25 DIAGNOSIS — W1809XA Striking against other object with subsequent fall, initial encounter: Secondary | ICD-10-CM | POA: Insufficient documentation

## 2013-02-25 DIAGNOSIS — E079 Disorder of thyroid, unspecified: Secondary | ICD-10-CM | POA: Insufficient documentation

## 2013-02-25 DIAGNOSIS — F17200 Nicotine dependence, unspecified, uncomplicated: Secondary | ICD-10-CM | POA: Insufficient documentation

## 2013-02-25 DIAGNOSIS — J449 Chronic obstructive pulmonary disease, unspecified: Secondary | ICD-10-CM | POA: Insufficient documentation

## 2013-02-25 MED ORDER — CLINDAMYCIN HCL 150 MG PO CAPS
300.0000 mg | ORAL_CAPSULE | Freq: Once | ORAL | Status: AC
  Administered 2013-02-25: 300 mg via ORAL
  Filled 2013-02-25: qty 2

## 2013-02-25 MED ORDER — HYDROCODONE-ACETAMINOPHEN 5-325 MG PO TABS: 1.0000 | ORAL_TABLET | ORAL | Status: DC | PRN

## 2013-02-25 MED ORDER — CLINDAMYCIN HCL 150 MG PO CAPS: 150.0000 mg | ORAL_CAPSULE | Freq: Four times a day (QID) | ORAL | Status: DC

## 2013-02-25 MED ORDER — HYDROCODONE-ACETAMINOPHEN 5-325 MG PO TABS
1.0000 | ORAL_TABLET | Freq: Once | ORAL | Status: AC
  Administered 2013-02-25: 1 via ORAL
  Filled 2013-02-25: qty 1

## 2013-02-25 MED ORDER — LORAZEPAM 2 MG/ML IJ SOLN
1.0000 mg | Freq: Once | INTRAMUSCULAR | Status: AC
  Administered 2013-02-25: 1 mg via INTRAMUSCULAR
  Filled 2013-02-25: qty 1

## 2013-02-25 NOTE — ED Notes (Signed)
Pt dc to home. Pt sts understanding to dc instructions.  Pt ambulatory to exit without difficulty. Pt denied use of w/c for discharge. Family at pt side for departure.

## 2013-02-25 NOTE — ED Notes (Signed)
Pt fell Sunday after her leg "went out from under her."  Pt states that she fell and hit her face/head.  There is swelling in her mouth, as well as a puncture/laceration wound on her top lip that she states came from her tooth and went all the way through.  Patients front upper tooth is chipped.  She complains of generalized pain but more so in her neck and left shoulder.  She is able to move her neck, there is no limit in range of motion in her left shoulder which is where she states the pain is mostly.  She also complains of pain in her right knee.

## 2013-02-25 NOTE — ED Notes (Addendum)
Pt fell Sunday and front teeth "Went through my upper lip." c/o continued pain to upper lip now and front teeth feel" loose and chipped." also having nose pain

## 2013-02-25 NOTE — ED Provider Notes (Signed)
CSN: [id number]     Arrival date & time 02/25/13  1615 History  This chart was scribed for non-physician practitioner Delos Haring, PA-C working with Jasper Riling. Alvino Chapel, MD by Rolanda Lundborg, ED Scribe. This patient was seen in room TR07C/TR07C and the patient's care was started at 4:49 PM.   Chief Complaint  Patient presents with  . Mouth Injury   The history is provided by the patient. No language interpreter was used.   HPI Comments: Mallory Potts is a 47 y.o. female with PMH positive for diabetes, asthma, arthritis, hypertension, epilepsy, COPD, Bipolar Type 1, hx of stroke who presents to the Emergency Department complaining of a fall two days ago at 6:30am. She states she was trying to walk to the bathroom without her cane and she fell on the wooden floor and hit her face. She states her tooth went through her upper lip. Pt is c/o pain to her upper lip, nose, neck, and left shoulder. She has limited ROM of the neck with pain. She has taken tylenol for the pain. She has a h/o falls and states that the last time this happened they told her to come back in two days when the swelling went down, which is why she has waited two days to come here. She has no h/o neck injury.   Past Medical History  Diagnosis Date  . Diabetes mellitus   . Asthma   . Arthritis   . Hypertension   . Epilepsy   . Seizures   . Thyroid disease   . COPD (chronic obstructive pulmonary disease)   . Bipolar 1 disorder   . Peripheral edema   . OCD (obsessive compulsive disorder)   . COPD (chronic obstructive pulmonary disease)   . Depression   . Anxiety    Past Surgical History  Procedure Laterality Date  . Tonsillectomy    . Knee arthroscopy     Family History  Problem Relation Age of Onset  . Asthma Other   . Diabetes Other   . Hypertension Other    History  Substance Use Topics  . Smoking status: Current Every Day Smoker -- 0.50 packs/day for 5 years    Types: Cigarettes  . Smokeless tobacco: Not on  file  . Alcohol Use: No     Comment: OCASSIONALLY   OB History   Grav Para Term Preterm Abortions TAB SAB Ect Mult Living                 Review of Systems  Constitutional: Negative for fever, diaphoresis, appetite change, fatigue and unexpected weight change.  HENT: Positive for facial swelling and neck pain. Negative for mouth sores.   Eyes: Negative for visual disturbance.  Respiratory: Negative for cough, chest tightness, shortness of breath and wheezing.   Cardiovascular: Negative for chest pain.  Gastrointestinal: Negative for nausea, vomiting, abdominal pain, diarrhea and constipation.  Endocrine: Negative for polydipsia, polyphagia and polyuria.  Genitourinary: Negative for dysuria, urgency, frequency and hematuria.  Musculoskeletal: Negative for back pain.  Skin: Negative for rash.  Allergic/Immunologic: Negative for immunocompromised state.  Neurological: Negative for syncope, light-headedness and headaches.  Hematological: Does not bruise/bleed easily.  Psychiatric/Behavioral: Negative for sleep disturbance. The patient is not nervous/anxious.   All other systems reviewed and are negative.    Allergies  Lithium; Abilify; Milk-related compounds; and Aripiprazole  Home Medications   Current Outpatient Rx  Name  Route  Sig  Dispense  Refill  . albuterol (PROVENTIL HFA;VENTOLIN HFA) 108 (90 BASE)  MCG/ACT inhaler   Inhalation   Inhale 2 puffs into the lungs every 6 (six) hours as needed. Breathing         . carbamazepine (TEGRETOL) 200 MG tablet   Oral   Take 100-200 mg by mouth QID. Take 1 tablet in the morning, 1 tablet at 4 pm and 2 tablets at night         . DULoxetine (CYMBALTA) 60 MG capsule   Oral   Take 60 mg by mouth daily.         . furosemide (LASIX) 20 MG tablet   Oral   Take 20 mg by mouth daily.         Marland Kitchen gabapentin (NEURONTIN) 300 MG capsule   Oral   Take 300 mg by mouth 2 (two) times daily with a meal.         . glipiZIDE  (GLUCOTROL) 5 MG tablet   Oral   Take 10 mg by mouth 3 (three) times daily.          . hydrOXYzine (VISTARIL) 50 MG capsule   Oral   Take 50 mg by mouth 3 (three) times daily as needed for itching.         . levothyroxine (SYNTHROID, LEVOTHROID) 50 MCG tablet   Oral   Take 50 mcg by mouth daily before breakfast.         . lisinopril (PRINIVIL,ZESTRIL) 2.5 MG tablet   Oral   Take 2.5 mg by mouth daily.         . metFORMIN (GLUCOPHAGE) 500 MG tablet   Oral   Take 1,000 mg by mouth 2 (two) times daily with a meal.          . omeprazole (PRILOSEC) 20 MG capsule   Oral   Take 20 mg by mouth daily.         . potassium chloride (K-DUR) 10 MEQ tablet   Oral   Take 10 mEq by mouth 2 (two) times daily.         . traZODone (DESYREL) 50 MG tablet   Oral   Take 50 mg by mouth at bedtime.         Marland Kitchen EXPIRED: albuterol (PROVENTIL) (5 MG/ML) 0.5% nebulizer solution   Nebulization   Take 5 mg by nebulization every 4 (four) hours as needed. Breathing         . clindamycin (CLEOCIN) 150 MG capsule   Oral   Take 1 capsule (150 mg total) by mouth every 6 (six) hours.   28 capsule   0   . HYDROcodone-acetaminophen (NORCO/VICODIN) 5-325 MG per tablet   Oral   Take 1-2 tablets by mouth every 4 (four) hours as needed for pain.   20 tablet   0    BP 140/74  Pulse 90  Temp(Src) 98.1 F (36.7 C) (Oral)  Resp 16  Ht 5\' 5"  (1.651 m)  Wt 237 lb 6.4 oz (107.684 kg)  BMI 39.51 kg/m2  SpO2 97% Physical Exam  Nursing note and vitals reviewed. Constitutional: She is oriented to person, place, and time. She appears well-developed and well-nourished. No distress.  HENT:  Head: Normocephalic. Head is with contusion. Head is without raccoon's eyes and without Battle's sign.  Right Ear: No hemotympanum.  Left Ear: No hemotympanum.  Nose: Sinus tenderness present. Right sinus exhibits maxillary sinus tenderness. Left sinus exhibits maxillary sinus tenderness.  Mouth/Throat:  Oropharynx is clear and moist and mucous membranes are normal.    Small  amount of puss draining from open wound to lip  Eyes: EOM are normal.  Neck: Neck supple. Spinous process tenderness and muscular tenderness present. No rigidity. Decreased range of motion present. No tracheal deviation, no edema and no erythema present.  c-collar in place  Cardiovascular: Normal rate.   Pulmonary/Chest: Effort normal. No respiratory distress.  Musculoskeletal:       Left shoulder: She exhibits decreased range of motion, tenderness and pain. She exhibits no swelling, no effusion, no crepitus, no deformity, no laceration, normal pulse and normal strength.  Neurological: She is alert and oriented to person, place, and time.  Skin: Skin is warm and dry.  Psychiatric: She has a normal mood and affect. Her behavior is normal.    ED Course  FOREIGN BODY REMOVAL Date/Time: 02/25/2013 9:12 PM Performed by: Noemi Chapel D Authorized by: Linus Mako Consent: Verbal consent obtained. Risks and benefits: risks, benefits and alternatives were discussed Consent given by: patient Patient identity confirmed: verbally with patient Body area: mucosa General location: head/neck Location details: mouth Anesthesia: local infiltration and nerve block Local anesthetic: lidocaine 2% without epinephrine Anesthetic total: 8 ml Patient sedated: no Depth: deep Complexity: complex 1 objects recovered. Objects recovered: tooth chip Post-procedure assessment: foreign body removed   (including critical care time) Medications  clindamycin (CLEOCIN) capsule 300 mg (not administered)  HYDROcodone-acetaminophen (NORCO/VICODIN) 5-325 MG per tablet 1 tablet (1 tablet Oral Given 02/25/13 1750)  LORazepam (ATIVAN) injection 1 mg (1 mg Intramuscular Given 02/25/13 2011)    DIAGNOSTIC STUDIES: Oxygen Saturation is 97% on RA, normal by my interpretation.    COORDINATION OF CARE: 5:31 PM- Discussed treatment plan with  pt which includes a dose of pain meds and x-rays and pt agrees to plan.  7:43 PM- Rechecked with pt regarding x-ray findings and discussed plan to remove tooth from lip. Pt agrees to plan.      Labs Review Labs Reviewed - No data to display Imaging Review Ct Cervical Spine Wo Contrast  02/25/2013   CLINICAL DATA:  Fall, mouth injury  EXAM: CT MAXILLOFACIAL WITHOUT CONTRAST  CT CERVICAL SPINE WITHOUT CONTRAST  TECHNIQUE: Multidetector CT imaging of the head, cervical spine, and maxillofacial structures were performed using the standard protocol without intravenous contrast. Multiplanar CT image reconstructions of the cervical spine and maxillofacial structures were also generated.  COMPARISON:  None.  FINDINGS: CT MAXILLOFACIAL FINDINGS  Limited evaluation of the intracranial contents demonstrates no acute intracranial hemorrhage or other abnormality. Evaluation is limited. The globes and orbits are intact and symmetric bilaterally. Focal tooth fragments of the left central incisor is present within the soft tissues of the upper lip. There is associated soft tissue swelling and irregularity anteriorly consistent with a laceration. Fatty infiltration of the parotid glands bilaterally. Shotty, not enlarged by CT criteria cervical and submandibular adenopathy. No other acute soft tissue abnormality. No acute fracture identified. The mandible is intact. The hard palate is intact. No acute calvarial fracture in the visualized portions.  CT CERVICAL SPINE FINDINGS  No acute fracture, malalignment or prevertebral soft tissue swelling. Normal aeration of the mastoid air cells. Multilevel cervical spondylosis most significant at C6-C7 where there is loss of disc space height and a large posterior disc osteophyte complex which results in moderate narrowing of the central canal. There is a straightening of the normal cervical lordosis which may be degenerative or related to muscle spasm. Lung apices are  unremarkable. Normal thyroid gland. No acute soft tissue abnormality.  IMPRESSION: CT MAXILLOFACIAL IMPRESSION  Chipped left central incisor. The tooth fragment is imbedded in the soft tissues of the upper lip which are swollen and irregular consistent with laceration.  Otherwise, no acute fracture or abnormality.  CT CERVICAL SPINE IMPRESSION  No acute fracture or malalignment.  Focal C6-C7 degenerative disc disease with a large posterior disc osteophyte complex resulting in moderate central canal stenosis.   Electronically Signed   By: Jacqulynn Cadet M.D.   On: 02/25/2013 19:26   Dg Shoulder Left  02/25/2013   *RADIOLOGY REPORT*  Clinical Data: Post fall, now with left shoulder pain, initial encounter.  LEFT SHOULDER - 2+ VIEW  Comparison: None.  Findings:  No definite fracture or dislocation.  Glenohumeral joint spaces appear preserved.  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  Note is made of a punctate osseous structure adjacent to the greater tuberosity.  No evidence of calcific tendonitis.  Limited visualization adjacent thorax is normal.  Regional soft tissues are normal.  IMPRESSION: 1.  No definite acute findings. 2.  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 3.  Osseous structure adjacent to the greater tuberosity may represent either enthesopathic change of the rotator cuff insertion site versus a displaced intra-articular loose body.   Original Report Authenticated By: Jake Seats, MD   Ct Maxillofacial Wo Cm  02/25/2013   CLINICAL DATA:  Fall, mouth injury  EXAM: CT MAXILLOFACIAL WITHOUT CONTRAST  CT CERVICAL SPINE WITHOUT CONTRAST  TECHNIQUE: Multidetector CT imaging of the head, cervical spine, and maxillofacial structures were performed using the standard protocol without intravenous contrast. Multiplanar CT image reconstructions of the cervical spine and maxillofacial structures  were also generated.  COMPARISON:  None.  FINDINGS: CT MAXILLOFACIAL FINDINGS  Limited evaluation of the intracranial contents demonstrates no acute intracranial hemorrhage or other abnormality. Evaluation is limited. The globes and orbits are intact and symmetric bilaterally. Focal tooth fragments of the left central incisor is present within the soft tissues of the upper lip. There is associated soft tissue swelling and irregularity anteriorly consistent with a laceration. Fatty infiltration of the parotid glands bilaterally. Shotty, not enlarged by CT criteria cervical and submandibular adenopathy. No other acute soft tissue abnormality. No acute fracture identified. The mandible is intact. The hard palate is intact. No acute calvarial fracture in the visualized portions.  CT CERVICAL SPINE FINDINGS  No acute fracture, malalignment or prevertebral soft tissue swelling. Normal aeration of the mastoid air cells. Multilevel cervical spondylosis most significant at C6-C7 where there is loss of disc space height and a large posterior disc osteophyte complex which results in moderate narrowing of the central canal. There is a straightening of the normal cervical lordosis which may be degenerative or related to muscle spasm. Lung apices are unremarkable. Normal thyroid gland. No acute soft tissue abnormality.  IMPRESSION: CT MAXILLOFACIAL IMPRESSION  Chipped left central incisor. The tooth fragment is imbedded in the soft tissues of the upper lip which are swollen and irregular consistent with laceration.  Otherwise, no acute fracture or abnormality.  CT CERVICAL SPINE IMPRESSION  No acute fracture or malalignment.  Focal C6-C7 degenerative disc disease with a large posterior disc osteophyte complex resulting in moderate central canal stenosis.   Electronically Signed   By: Jacqulynn Cadet M.D.   On: 02/25/2013 19:26    MDM   1. Broken tooth injury, open, initial encounter   2. Fall (on)(from) incline, initial  encounter   3. Facial contusion, initial encounter    Dr. Sabra Heck assisted me in removing foreign body that remained patients lip. A very tiny fragment was unable to be removed but the large one was removed. Will start patient on Clindamycin for infection to lip. Referral to Oral surgery given. The patients Case manager is here and will help her set up appointment.  47 y.o.Mallory Potts's evaluation in the Emergency Department is complete. It has been determined that no acute conditions requiring further emergency intervention are present at this time. The patient/guardian have been advised of the diagnosis and plan. We have discussed signs and symptoms that warrant return to the ED, such as changes or worsening in symptoms.  Vital signs are stable at discharge. Filed Vitals:   02/25/13 2113  BP: 112/71  Pulse: 68  Temp: 97 F (36.1 C)  Resp: 19    Patient/guardian has voiced understanding and agreed to follow-up with the PCP or specialist.  I personally performed the services described in this documentation, which was scribed in my presence. The recorded information has been reviewed and is accurate.   Linus Mako, PA-C 02/25/13 2114

## 2013-02-25 NOTE — ED Notes (Signed)
Patient c/o pain in neck that radiates towards left shoulder.

## 2013-02-25 NOTE — ED Provider Notes (Signed)
Pt with recent fall - injured her upper lip - she has a swollen upper lip with an open wound on the inner surface - she has FB in the wound associated with a broken lower central incisor - I personally removed the tooth fragment from the lip after performing regional anesthesia on the bilateral inferior orbital nerves.  She toelrated the procedure well.  See PA Greene's note.  Medical screening examination/treatment/procedure(s) were conducted as a shared visit with non-physician practitioner(s) and myself.  I personally evaluated the patient during the encounter.  Clinical Impression: fractured tooth, laceration of upper lip, foreign body in upper lip     Johnna Acosta, MD 02/25/13 2343

## 2013-02-25 NOTE — ED Notes (Signed)
Dr. Sabra Heck at bedside to help with fb removal

## 2013-02-26 NOTE — Progress Notes (Signed)
   CARE MANAGEMENT ED NOTE 02/26/2013  Patient:  Mallory Potts, Mallory Potts   Account Number:  [id number]  Date Initiated:  02/25/2013  Documentation initiated by:  [facility]  Subjective/Objective Assessment:   medication assistance     Subjective/Objective Assessment Detail:     Action/Plan:   Action/Plan Detail:   Anticipated DC Date:  02/25/2013     Status Recommendation to Physician:   Result of Recommendation:    Other ED Services  Consult [facility] [place]  CM consult  [place] [facility]    Choice offered to / List presented to:  C-1 Patient          Status of service:  Completed, signed off  ED Comments:   ED Comments Detail:  02/26/13 1615 W. Hanifa Antonetti RN BSN [phone] ED CM Received call from [facility] at [facility] stating that patient is not able to afford her Clindamycin antibiotic due to price. Pt was seen here at [facility] ED s/p fall with laceration to face and mouth. Pt eligible for [facility] enrolled and printed letter and faxes to [facility] pharmacy at [phone]. Received confirmation on fax transmittal. No further CM needs identified

## 2013-03-20 ENCOUNTER — Other Ambulatory Visit (HOSPITAL_COMMUNITY): Payer: Self-pay | Admitting: Internal Medicine

## 2013-03-20 DIAGNOSIS — Z1231 Encounter for screening mammogram for malignant neoplasm of breast: Secondary | ICD-10-CM

## 2013-04-09 ENCOUNTER — Ambulatory Visit (HOSPITAL_COMMUNITY)
Admission: RE | Admit: 2013-04-09 | Discharge: 2013-04-09 | Disposition: A | Discharge status: Home / Self Care | Payer: No Typology Code available for payment source | Source: Ambulatory Visit | Attending: Internal Medicine | Admitting: Internal Medicine

## 2013-04-09 DIAGNOSIS — Z1231 Encounter for screening mammogram for malignant neoplasm of breast: Secondary | ICD-10-CM

## 2014-10-13 ENCOUNTER — Emergency Department (HOSPITAL_COMMUNITY): Payer: Medicare Other

## 2014-10-13 ENCOUNTER — Encounter (HOSPITAL_COMMUNITY): Payer: Self-pay

## 2014-10-13 ENCOUNTER — Emergency Department (HOSPITAL_COMMUNITY)
Admission: EM | Admit: 2014-10-13 | Discharge: 2014-10-13 | Disposition: A | Discharge status: Home / Self Care | Payer: Medicare Other | Attending: Emergency Medicine | Admitting: Emergency Medicine

## 2014-10-13 DIAGNOSIS — Y998 Other external cause status: Secondary | ICD-10-CM | POA: Diagnosis not present

## 2014-10-13 DIAGNOSIS — F319 Bipolar disorder, unspecified: Secondary | ICD-10-CM | POA: Insufficient documentation

## 2014-10-13 DIAGNOSIS — F419 Anxiety disorder, unspecified: Secondary | ICD-10-CM | POA: Insufficient documentation

## 2014-10-13 DIAGNOSIS — L97519 Non-pressure chronic ulcer of other part of right foot with unspecified severity: Secondary | ICD-10-CM | POA: Diagnosis not present

## 2014-10-13 DIAGNOSIS — S92531A Displaced fracture of distal phalanx of right lesser toe(s), initial encounter for closed fracture: Secondary | ICD-10-CM | POA: Diagnosis not present

## 2014-10-13 DIAGNOSIS — W228XXA Striking against or struck by other objects, initial encounter: Secondary | ICD-10-CM | POA: Insufficient documentation

## 2014-10-13 DIAGNOSIS — M1990 Unspecified osteoarthritis, unspecified site: Secondary | ICD-10-CM | POA: Insufficient documentation

## 2014-10-13 DIAGNOSIS — I10 Essential (primary) hypertension: Secondary | ICD-10-CM | POA: Diagnosis not present

## 2014-10-13 DIAGNOSIS — S92912A Unspecified fracture of left toe(s), initial encounter for closed fracture: Secondary | ICD-10-CM

## 2014-10-13 DIAGNOSIS — Z720 Tobacco use: Secondary | ICD-10-CM | POA: Insufficient documentation

## 2014-10-13 DIAGNOSIS — Z79899 Other long term (current) drug therapy: Secondary | ICD-10-CM | POA: Insufficient documentation

## 2014-10-13 DIAGNOSIS — L03115 Cellulitis of right lower limb: Secondary | ICD-10-CM | POA: Insufficient documentation

## 2014-10-13 DIAGNOSIS — E079 Disorder of thyroid, unspecified: Secondary | ICD-10-CM | POA: Diagnosis not present

## 2014-10-13 DIAGNOSIS — G40909 Epilepsy, unspecified, not intractable, without status epilepticus: Secondary | ICD-10-CM | POA: Diagnosis not present

## 2014-10-13 DIAGNOSIS — E11621 Type 2 diabetes mellitus with foot ulcer: Secondary | ICD-10-CM | POA: Insufficient documentation

## 2014-10-13 DIAGNOSIS — Y9389 Activity, other specified: Secondary | ICD-10-CM | POA: Diagnosis not present

## 2014-10-13 DIAGNOSIS — Y9289 Other specified places as the place of occurrence of the external cause: Secondary | ICD-10-CM | POA: Insufficient documentation

## 2014-10-13 DIAGNOSIS — T1490XA Injury, unspecified, initial encounter: Secondary | ICD-10-CM

## 2014-10-13 DIAGNOSIS — L03119 Cellulitis of unspecified part of limb: Secondary | ICD-10-CM

## 2014-10-13 DIAGNOSIS — J449 Chronic obstructive pulmonary disease, unspecified: Secondary | ICD-10-CM | POA: Diagnosis not present

## 2014-10-13 DIAGNOSIS — S99921A Unspecified injury of right foot, initial encounter: Secondary | ICD-10-CM | POA: Diagnosis present

## 2014-10-13 DIAGNOSIS — Z791 Long term (current) use of non-steroidal anti-inflammatories (NSAID): Secondary | ICD-10-CM | POA: Insufficient documentation

## 2014-10-13 DIAGNOSIS — L97529 Non-pressure chronic ulcer of other part of left foot with unspecified severity: Secondary | ICD-10-CM

## 2014-10-13 MED ORDER — HYDROCODONE-ACETAMINOPHEN 5-325 MG PO TABS
2.0000 | ORAL_TABLET | Freq: Once | ORAL | Status: AC
  Administered 2014-10-13: 2 via ORAL
  Filled 2014-10-13: qty 2

## 2014-10-13 MED ORDER — CLINDAMYCIN HCL 150 MG PO CAPS: 450.0000 mg | ORAL_CAPSULE | Freq: Three times a day (TID) | ORAL | Status: DC

## 2014-10-13 MED ORDER — HYDROCODONE-ACETAMINOPHEN 5-325 MG PO TABS: 1.0000 | ORAL_TABLET | Freq: Four times a day (QID) | ORAL | Status: DC | PRN

## 2014-10-13 NOTE — Discharge Instructions (Signed)
Skin Ulcer  A skin ulcer is an open sore that can be shallow or deep. Skin ulcers sometimes become infected and are difficult to treat. It may be 1 month or longer before real healing progress is made.  CAUSES    Injury.   Problems with the veins or arteries.   Diabetes.   Insect bites.   Bedsores.   Inflammatory conditions.  SYMPTOMS    Pain, redness, swelling, and tenderness around the ulcer.   Fever.   Bleeding from the ulcer.   Yellow or clear fluid coming from the ulcer.  DIAGNOSIS   There are many types of skin ulcers. Any open sores will be examined. Certain tests will be done to determine the kind of ulcer you have. The right treatment depends on the type of ulcer you have.  TREATMENT   Treatment is a long-term challenge. It may include:   Wearing an elastic wrap, compression stockings, or gel cast over the ulcer area.   Taking antibiotic medicines or putting antibiotic creams on the affected area if there is an infection.  HOME CARE INSTRUCTIONS   Put on your bandages (dressings), wraps, or casts over the ulcer as directed by your caregiver.   Change all dressings as directed by your caregiver.   Take all medicines as directed by your caregiver.   Keep the affected area clean and dry.   Avoid injuries to the affected area.   Eat a well-balanced, healthy diet that includes plenty of fruit and vegetables.   If you smoke, consider quitting or decreasing the amount of cigarettes you smoke.   Once the ulcer heals, get regular exercise as directed by your caregiver.   Work with your caregiver to make sure your blood pressure, cholesterol, and diabetes are well-controlled.   Keep your skin moisturized. Dry skin can crack and lead to skin ulcers.  SEEK IMMEDIATE MEDICAL CARE IF:    Your pain gets worse.   You have swelling, redness, or fluids around the ulcer.   You have chills.   You have a fever.  MAKE SURE YOU:    Understand these instructions.   Will watch your condition.   Will get  help right away if you are not doing well or get worse.  Document Released: 06/15/2004 Document Revised: 07/31/2011 Document Reviewed: 12/23/2010  [employer] Patient Information 2015 [employer]. This information is not intended to replace advice given to you by your health care provider. Make sure you discuss any questions you have with your health care provider.

## 2014-10-13 NOTE — ED Notes (Signed)
Pt injured toe on dresser x 2 weeks ago.  Toenail came off.  Today did same and opened bottom of toe.  Pt is a diabetic .  Not getting better.

## 2014-10-13 NOTE — ED Provider Notes (Signed)
CSN: [id number]     Arrival date & time 10/13/14  1644 History   First MD Initiated Contact with Patient 10/13/14 1741     Chief Complaint  Patient presents with  . Toe Pain     (Consider location/radiation/quality/duration/timing/severity/associated sxs/prior Treatment) Patient is a 49 y.o. female presenting with toe pain. The history is provided by the patient. No language interpreter was used.  Toe Pain This is a new problem. The current episode started more than 1 week ago. The problem occurs constantly. The problem has been gradually worsening. Pertinent negatives include no chest pain, no abdominal pain, no headaches and no shortness of breath. Associated symptoms comments: Subjective fever. Nothing aggravates the symptoms. Nothing relieves the symptoms. Treatments tried: Took some leftover amoxicillin. The treatment provided mild relief.    Past Medical History  Diagnosis Date  . Diabetes mellitus   . Asthma   . Arthritis   . Hypertension   . Epilepsy   . Seizures   . Thyroid disease   . COPD (chronic obstructive pulmonary disease)   . Bipolar 1 disorder   . Peripheral edema   . OCD (obsessive compulsive disorder)   . COPD (chronic obstructive pulmonary disease)   . Depression   . Anxiety    Past Surgical History  Procedure Laterality Date  . Tonsillectomy    . Knee arthroscopy     Family History  Problem Relation Age of Onset  . Asthma Other   . Diabetes Other   . Hypertension Other    History  Substance Use Topics  . Smoking status: Current Every Day Smoker -- 0.50 packs/day for 5 years    Types: Cigarettes  . Smokeless tobacco: Not on file  . Alcohol Use: No     Comment: OCASSIONALLY   OB History    No data available     Review of Systems  Constitutional: Negative for fever, chills, diaphoresis, activity change, appetite change and fatigue.  HENT: Negative for congestion, facial swelling, rhinorrhea and sore throat.   Eyes: Negative for photophobia  and discharge.  Respiratory: Negative for cough, chest tightness and shortness of breath.   Cardiovascular: Negative for chest pain, palpitations and leg swelling.  Gastrointestinal: Negative for nausea, vomiting, abdominal pain and diarrhea.  Endocrine: Negative for polydipsia and polyuria.  Genitourinary: Negative for dysuria, frequency, difficulty urinating and pelvic pain.  Musculoskeletal: Negative for back pain, arthralgias, neck pain and neck stiffness.  Skin: Negative for color change and wound.  Allergic/Immunologic: Negative for immunocompromised state.  Neurological: Negative for facial asymmetry, weakness, numbness and headaches.  Hematological: Does not bruise/bleed easily.  Psychiatric/Behavioral: Negative for confusion and agitation.      Allergies  Lithium; Abilify; Milk-related compounds; Other; and Aripiprazole  Home Medications   Prior to Admission medications   Medication Sig Start Date End Date Taking? Authorizing Provider  acetaminophen (TYLENOL) 500 MG tablet Take 1,000 mg by mouth every 6 (six) hours as needed for mild pain, moderate pain or headache.   Yes Historical Provider, MD  albuterol (PROVENTIL HFA;VENTOLIN HFA) 108 (90 BASE) MCG/ACT inhaler Inhale 2 puffs into the lungs every 6 (six) hours as needed. Breathing 11/08/11  Yes Melynda Ripple, MD  albuterol (PROVENTIL) (2.5 MG/3ML) 0.083% nebulizer solution Take 2.5 mg by nebulization 2 times daily at 12 noon and 4 pm.   Yes Historical Provider, MD  Aspirin-Caffeine 400-32 MG TABS Take 2 tablets by mouth daily as needed (migraine.).   Yes Historical Provider, MD  carbamazepine (TEGRETOL) 200 MG  tablet Take 200-400 mg by mouth 2 (two) times daily. Take 200 mg every morning and 400 mg at bedtime.   Yes Historical Provider, MD  cetirizine (ZYRTEC) 10 MG tablet Take 10 mg by mouth daily.   Yes Historical Provider, MD  chlorthalidone (HYGROTON) 25 MG tablet Take 25 mg by mouth daily.   Yes Historical Provider, MD   diphenhydrAMINE (BENADRYL) 25 mg capsule Take 25 mg by mouth every 8 (eight) hours as needed for itching.   Yes Historical Provider, MD  esomeprazole (NEXIUM) 40 MG capsule Take 40 mg by mouth daily at 12 noon.   Yes Historical Provider, MD  furosemide (LASIX) 40 MG tablet Take 40 mg by mouth daily.   Yes Historical Provider, MD  gabapentin (NEURONTIN) 100 MG capsule Take 100 mg by mouth 2 (two) times daily.   Yes Historical Provider, MD  glipiZIDE (GLUCOTROL) 5 MG tablet Take 5 mg by mouth 2 (two) times daily before a meal.    Yes Historical Provider, MD  insulin glargine (LANTUS) 100 UNIT/ML injection Inject 15 Units into the skin at bedtime.   Yes Historical Provider, MD  levothyroxine (SYNTHROID, LEVOTHROID) 50 MCG tablet Take 50 mcg by mouth daily before breakfast.   Yes Historical Provider, MD  Lurasidone HCl 20 MG TABS Take 20 mg by mouth every evening.   Yes Historical Provider, MD  meloxicam (MOBIC) 15 MG tablet Take 15 mg by mouth daily.   Yes Historical Provider, MD  Omega-3 Fatty Acids (FISH OIL PO) Take 1,000 mg by mouth 2 (two) times daily.   Yes Historical Provider, MD  sitaGLIPtin-metformin (JANUMET) 50-1000 MG per tablet Take 1 tablet by mouth 2 (two) times daily with a meal.   Yes Historical Provider, MD  tiotropium (SPIRIVA) 18 MCG inhalation capsule Place 18 mcg into inhaler and inhale daily.   Yes Historical Provider, MD  traZODone (DESYREL) 50 MG tablet Take 50 mg by mouth at bedtime.   Yes Historical Provider, MD  albuterol (PROVENTIL) (5 MG/ML) 0.5% nebulizer solution Take 5 mg by nebulization every 4 (four) hours as needed. Breathing 11/08/11 12/29/12  Melynda Ripple, MD  clindamycin (CLEOCIN) 150 MG capsule Take 3 capsules (450 mg total) by mouth 3 (three) times daily. Please take full course 10/13/14   Mallory Patches, MD  DULoxetine (CYMBALTA) 60 MG capsule Take 60 mg by mouth daily.    Historical Provider, MD  furosemide (LASIX) 20 MG tablet Take 20 mg by mouth daily.  10/26/11   Melynda Ripple, MD  gabapentin (NEURONTIN) 300 MG capsule Take 300 mg by mouth 2 (two) times daily with a meal.    Historical Provider, MD  HYDROcodone-acetaminophen (NORCO) 5-325 MG per tablet Take 1 tablet by mouth every 6 (six) hours as needed. 10/13/14   Mallory Patches, MD  hydrOXYzine (VISTARIL) 50 MG capsule Take 50 mg by mouth 3 (three) times daily as needed for itching.    Historical Provider, MD  lisinopril (PRINIVIL,ZESTRIL) 2.5 MG tablet Take 2.5 mg by mouth daily.    Historical Provider, MD  metFORMIN (GLUCOPHAGE) 500 MG tablet Take 1,000 mg by mouth 2 (two) times daily with a meal.     Historical Provider, MD  omeprazole (PRILOSEC) 20 MG capsule Take 20 mg by mouth daily.    Historical Provider, MD  potassium chloride (K-DUR) 10 MEQ tablet Take 10 mEq by mouth 2 (two) times daily.    Historical Provider, MD   BP 127/59 mmHg  Pulse 95  Temp(Src) 100.3 F (37.9 C) (  Oral)  Resp 20  SpO2 98%  LMP 10/09/2014 (Exact Date) Physical Exam  Constitutional: She is oriented to person, place, and time. She appears well-developed and well-nourished. No distress.  HENT:  Head: Normocephalic and atraumatic.  Mouth/Throat: No oropharyngeal exudate.  Eyes: Pupils are equal, round, and reactive to light.  Neck: Normal range of motion. Neck supple.  Cardiovascular: Normal rate, regular rhythm and normal heart sounds.  Exam reveals no gallop and no friction rub.   No murmur heard. Pulmonary/Chest: Effort normal and breath sounds normal. No respiratory distress. She has no wheezes. She has no rales.  Abdominal: Soft. Bowel sounds are normal. She exhibits no distension and no mass. There is no tenderness. There is no rebound and no guarding.  Musculoskeletal: Normal range of motion. She exhibits no edema or tenderness.       Feet:  Neurological: She is alert and oriented to person, place, and time.  Skin: Skin is warm and dry.  Psychiatric: She has a normal mood and affect.    ED  Course  Procedures (including critical care time) Labs Review Labs Reviewed - No data to display  Imaging Review Dg Foot Complete Right  10/13/2014   CLINICAL DATA:  Second toe right foot injury 2 weeks ago  EXAM: RIGHT FOOT COMPLETE - 3+ VIEW  COMPARISON:  None.  FINDINGS: Three views of the right foot submitted. There is avulsion fracture at the tip of distal phalanx second toe. There is soft tissue swelling and soft tissue irregularity second toe.  IMPRESSION: Avulsion fracture at the tip of distal phalanx second toe. Soft tissue swelling noted second toe.   Electronically Signed   By: Lahoma Crocker M.D.   On: 10/13/2014 18:28     EKG Interpretation None      MDM   Final diagnoses:  Diabetic ulcer of left foot associated with type 2 diabetes mellitus  Cellulitis of foot  Phalanx fracture, foot, left, closed, initial encounter    Pt is a 49 y.o. female with Pmhx as above who presents with pain of left dorsal foot and second toe.  She states several weeks ago she bumped it on furniture and her toenail fell off.  About a week ago she hit it again and noticed that she now has a wound on the bottom side of the second toe.  On physical exam she has a chronic appearing ulceration of the second toe with skin thickening and scant amount of purulent drainage, she has several centimeter area of cellulitis on the dorsum of the foot.  She reports she has having fevers at home, temperature is 99.3 here and she is otherwise well-appearing.  X-ray of foot with avulsion fracture of the tip of the distal phalanx of the second toe.  Patient be placed on clindamycin for cellulitis and asked to follow-up with orthopedics for surgical evaluation.Pt placed in post-op boot.       Mallory Potts evaluation in the Emergency Department is complete. It has been determined that no acute conditions requiring further emergency intervention are present at this time. The patient/guardian have been advised of the diagnosis  and plan. We have discussed signs and symptoms that warrant return to the ED, such as changes or worsening in symptoms, worsening redness, swelling, red streaking failure of improvement, continued fevers.       Mallory Patches, MD 10/14/14 1028

## 2014-10-23 ENCOUNTER — Encounter (HOSPITAL_COMMUNITY): Payer: Self-pay | Admitting: Emergency Medicine

## 2014-10-23 ENCOUNTER — Inpatient Hospital Stay (HOSPITAL_COMMUNITY)
Admission: EM | Admit: 2014-10-23 | Discharge: 2014-10-30 | DRG: 617 | Disposition: A | Discharge status: Skilled Nursing Facility | Payer: Medicare Other | Attending: Internal Medicine | Admitting: Internal Medicine

## 2014-10-23 ENCOUNTER — Emergency Department (HOSPITAL_COMMUNITY): Payer: Medicare Other

## 2014-10-23 DIAGNOSIS — Z833 Family history of diabetes mellitus: Secondary | ICD-10-CM

## 2014-10-23 DIAGNOSIS — M869 Osteomyelitis, unspecified: Secondary | ICD-10-CM | POA: Diagnosis present

## 2014-10-23 DIAGNOSIS — L089 Local infection of the skin and subcutaneous tissue, unspecified: Secondary | ICD-10-CM

## 2014-10-23 DIAGNOSIS — E1169 Type 2 diabetes mellitus with other specified complication: Secondary | ICD-10-CM | POA: Diagnosis not present

## 2014-10-23 DIAGNOSIS — F603 Borderline personality disorder: Secondary | ICD-10-CM | POA: Diagnosis present

## 2014-10-23 DIAGNOSIS — G40802 Other epilepsy, not intractable, without status epilepticus: Secondary | ICD-10-CM

## 2014-10-23 DIAGNOSIS — IMO0002 Reserved for concepts with insufficient information to code with codable children: Secondary | ICD-10-CM | POA: Insufficient documentation

## 2014-10-23 DIAGNOSIS — Z6839 Body mass index (BMI) 39.0-39.9, adult: Secondary | ICD-10-CM

## 2014-10-23 DIAGNOSIS — Z7982 Long term (current) use of aspirin: Secondary | ICD-10-CM

## 2014-10-23 DIAGNOSIS — F319 Bipolar disorder, unspecified: Secondary | ICD-10-CM

## 2014-10-23 DIAGNOSIS — E039 Hypothyroidism, unspecified: Secondary | ICD-10-CM | POA: Diagnosis present

## 2014-10-23 DIAGNOSIS — Z8249 Family history of ischemic heart disease and other diseases of the circulatory system: Secondary | ICD-10-CM

## 2014-10-23 DIAGNOSIS — Z888 Allergy status to other drugs, medicaments and biological substances status: Secondary | ICD-10-CM

## 2014-10-23 DIAGNOSIS — I96 Gangrene, not elsewhere classified: Secondary | ICD-10-CM | POA: Diagnosis present

## 2014-10-23 DIAGNOSIS — J449 Chronic obstructive pulmonary disease, unspecified: Secondary | ICD-10-CM

## 2014-10-23 DIAGNOSIS — E876 Hypokalemia: Secondary | ICD-10-CM | POA: Diagnosis present

## 2014-10-23 DIAGNOSIS — Z825 Family history of asthma and other chronic lower respiratory diseases: Secondary | ICD-10-CM

## 2014-10-23 DIAGNOSIS — E119 Type 2 diabetes mellitus without complications: Secondary | ICD-10-CM

## 2014-10-23 DIAGNOSIS — F429 Obsessive-compulsive disorder, unspecified: Secondary | ICD-10-CM

## 2014-10-23 DIAGNOSIS — G40909 Epilepsy, unspecified, not intractable, without status epilepticus: Secondary | ICD-10-CM

## 2014-10-23 DIAGNOSIS — I10 Essential (primary) hypertension: Secondary | ICD-10-CM

## 2014-10-23 DIAGNOSIS — F42 Obsessive-compulsive disorder: Secondary | ICD-10-CM | POA: Diagnosis present

## 2014-10-23 DIAGNOSIS — Z79891 Long term (current) use of opiate analgesic: Secondary | ICD-10-CM

## 2014-10-23 DIAGNOSIS — Z791 Long term (current) use of non-steroidal anti-inflammatories (NSAID): Secondary | ICD-10-CM

## 2014-10-23 DIAGNOSIS — L03119 Cellulitis of unspecified part of limb: Secondary | ICD-10-CM

## 2014-10-23 DIAGNOSIS — D509 Iron deficiency anemia, unspecified: Secondary | ICD-10-CM | POA: Diagnosis present

## 2014-10-23 DIAGNOSIS — J45909 Unspecified asthma, uncomplicated: Secondary | ICD-10-CM | POA: Diagnosis present

## 2014-10-23 DIAGNOSIS — E11628 Type 2 diabetes mellitus with other skin complications: Secondary | ICD-10-CM | POA: Diagnosis present

## 2014-10-23 DIAGNOSIS — L0390 Cellulitis, unspecified: Secondary | ICD-10-CM

## 2014-10-23 DIAGNOSIS — Z91011 Allergy to milk products: Secondary | ICD-10-CM

## 2014-10-23 DIAGNOSIS — E871 Hypo-osmolality and hyponatremia: Secondary | ICD-10-CM | POA: Diagnosis present

## 2014-10-23 DIAGNOSIS — F329 Major depressive disorder, single episode, unspecified: Secondary | ICD-10-CM | POA: Diagnosis present

## 2014-10-23 DIAGNOSIS — E1165 Type 2 diabetes mellitus with hyperglycemia: Secondary | ICD-10-CM | POA: Diagnosis present

## 2014-10-23 DIAGNOSIS — Z79899 Other long term (current) drug therapy: Secondary | ICD-10-CM

## 2014-10-23 DIAGNOSIS — F419 Anxiety disorder, unspecified: Secondary | ICD-10-CM | POA: Diagnosis present

## 2014-10-23 DIAGNOSIS — F17210 Nicotine dependence, cigarettes, uncomplicated: Secondary | ICD-10-CM | POA: Diagnosis present

## 2014-10-23 DIAGNOSIS — B373 Candidiasis of vulva and vagina: Secondary | ICD-10-CM | POA: Diagnosis present

## 2014-10-23 DIAGNOSIS — Z794 Long term (current) use of insulin: Secondary | ICD-10-CM

## 2014-10-23 DIAGNOSIS — L03115 Cellulitis of right lower limb: Secondary | ICD-10-CM | POA: Diagnosis present

## 2014-10-23 DIAGNOSIS — N179 Acute kidney failure, unspecified: Secondary | ICD-10-CM

## 2014-10-23 LAB — CBC WITH DIFFERENTIAL/PLATELET
BASOS ABS: 0 10*3/uL (ref 0.0–0.1)
Basophils Relative: 0 % (ref 0–1)
EOS ABS: 0 10*3/uL (ref 0.0–0.7)
Eosinophils Relative: 0 % (ref 0–5)
HCT: 27.6 % — ABNORMAL LOW (ref 36.0–46.0)
Hemoglobin: 9.2 g/dL — ABNORMAL LOW (ref 12.0–15.0)
LYMPHS ABS: 1.2 10*3/uL (ref 0.7–4.0)
Lymphocytes Relative: 6 % — ABNORMAL LOW (ref 12–46)
MCH: 30.6 pg (ref 26.0–34.0)
MCHC: 33.3 g/dL (ref 30.0–36.0)
MCV: 91.7 fL (ref 78.0–100.0)
Monocytes Absolute: 1 10*3/uL (ref 0.1–1.0)
Monocytes Relative: 5 % (ref 3–12)
NEUTROS PCT: 89 % — AB (ref 43–77)
Neutro Abs: 17.7 10*3/uL — ABNORMAL HIGH (ref 1.7–7.7)
PLATELETS: 270 10*3/uL (ref 150–400)
RBC: 3.01 MIL/uL — AB (ref 3.87–5.11)
RDW: 14 % (ref 11.5–15.5)
WBC: 20 10*3/uL — ABNORMAL HIGH (ref 4.0–10.5)

## 2014-10-23 LAB — I-STAT CG4 LACTIC ACID, ED: Lactic Acid, Venous: 0.9 mmol/L (ref 0.5–2.0)

## 2014-10-23 LAB — COMPREHENSIVE METABOLIC PANEL
ALT: 16 U/L (ref 14–54)
ANION GAP: 10 (ref 5–15)
AST: 19 U/L (ref 15–41)
Albumin: 2.4 g/dL — ABNORMAL LOW (ref 3.5–5.0)
Alkaline Phosphatase: 63 U/L (ref 38–126)
BUN: 15 mg/dL (ref 6–20)
CHLORIDE: 88 mmol/L — AB (ref 101–111)
CO2: 24 mmol/L (ref 22–32)
CREATININE: 1.09 mg/dL — AB (ref 0.44–1.00)
Calcium: 7.9 mg/dL — ABNORMAL LOW (ref 8.9–10.3)
GFR calc Af Amer: >60 mL/min (ref >60)
GFR calc non Af Amer: 59 mL/min — ABNORMAL LOW (ref >60)
Glucose, Bld: 401 mg/dL — ABNORMAL HIGH (ref 65–99)
Potassium: 3.2 mmol/L — ABNORMAL LOW (ref 3.5–5.1)
SODIUM: 122 mmol/L — AB (ref 135–145)
Total Bilirubin: 0.2 mg/dL — ABNORMAL LOW (ref 0.3–1.2)
Total Protein: 8.3 g/dL — ABNORMAL HIGH (ref 6.5–8.1)

## 2014-10-23 MED ORDER — HYDROMORPHONE HCL 1 MG/ML IJ SOLN
0.5000 mg | Freq: Once | INTRAMUSCULAR | Status: AC
  Administered 2014-10-24: 0.5 mg via INTRAVENOUS
  Filled 2014-10-23: qty 1

## 2014-10-23 MED ORDER — ONDANSETRON HCL 4 MG/2ML IJ SOLN
4.0000 mg | Freq: Once | INTRAMUSCULAR | Status: AC
  Administered 2014-10-24: 4 mg via INTRAVENOUS
  Filled 2014-10-23: qty 2

## 2014-10-23 NOTE — ED Notes (Signed)
Pt c/o wrosening cellulitis in R foot after breaking a toe about 2 weeks ago. Pt has been seen multiple times for the same. Pt was seen a week and a half ago and diagnosed with cellulitis. Pt has foot marked for area of infection and redness and swelling has gone past original mark. Pt is a diabetic and has hx of neuropathy. Pt A&Ox4.  Pt c/o N/V/D.

## 2014-10-23 NOTE — ED Provider Notes (Signed)
CSN: [id number]     Arrival date & time 10/23/14  1900 History   First MD Initiated Contact with Patient 10/23/14 2240     Chief Complaint  Patient presents with  . Cellulitis in foot      (Consider location/radiation/quality/duration/timing/severity/associated sxs/prior Treatment) HPI Mallory Potts is a 49 year old female with past medical history of diabetes, hypertension, COPD, bipolar disorder, peripheral edema presents the ER complaining of worsening cellulitis in her right foot. Patient states approximately 2 weeks ago she hit her toe, breaking the distal portion of her toe. Patient reports associated pain and swelling since then. Patient states presently 10 days ago she noticed associated worsening of swelling and redness which progressed up her foot. Patient diagnosed with cellulitis that time, placed on clindamycin. Patient's day to the past 10 days she has been taking the clindamycin as directed. She states over the past 4 days she's noticed worsening of her associated swelling and redness spreading past the marked area on her foot, up her lower ankle. Patient reports associated pain, tenderness, subjective fever. Patient denies nausea, vomiting, numbness, tingling, weakness, loss of sensation or function.  Past Medical History  Diagnosis Date  . Diabetes mellitus   . Asthma   . Arthritis   . Hypertension   . Epilepsy   . Seizures   . Thyroid disease   . COPD (chronic obstructive pulmonary disease)   . Bipolar 1 disorder   . Peripheral edema   . OCD (obsessive compulsive disorder)   . COPD (chronic obstructive pulmonary disease)   . Depression   . Anxiety    Past Surgical History  Procedure Laterality Date  . Tonsillectomy    . Knee arthroscopy     Family History  Problem Relation Age of Onset  . Asthma Other   . Diabetes Other   . Hypertension Other    History  Substance Use Topics  . Smoking status: Current Every Day Smoker -- 0.50 packs/day for 5 years    Types:  Cigarettes  . Smokeless tobacco: Not on file  . Alcohol Use: No     Comment: OCASSIONALLY   OB History    No data available     Review of Systems  Constitutional: Positive for fever.  HENT: Negative for trouble swallowing.   Eyes: Negative for visual disturbance.  Respiratory: Negative for shortness of breath.   Cardiovascular: Negative for chest pain.  Gastrointestinal: Negative for nausea, vomiting and abdominal pain.  Genitourinary: Negative for dysuria.  Musculoskeletal: Negative for neck pain.  Skin: Positive for color change and wound. Negative for rash.  Neurological: Negative for dizziness, weakness and numbness.  Psychiatric/Behavioral: Negative.       Allergies  Lithium; Milk-related compounds; Other; Abilify; and Aripiprazole  Home Medications   Prior to Admission medications   Medication Sig Start Date End Date Taking? Authorizing Provider  acetaminophen (TYLENOL) 500 MG tablet Take 1,000 mg by mouth every 6 (six) hours as needed for mild pain, moderate pain or headache.   Yes Historical Provider, MD  albuterol (PROVENTIL HFA;VENTOLIN HFA) 108 (90 BASE) MCG/ACT inhaler Inhale 2 puffs into the lungs every 6 (six) hours as needed for wheezing. Breathing 11/08/11  Yes Melynda Ripple, MD  albuterol (PROVENTIL) (2.5 MG/3ML) 0.083% nebulizer solution Take 2.5 mg by nebulization 2 times daily at 12 noon and 4 pm.   Yes Historical Provider, MD  Aspirin-Acetaminophen-Caffeine [facility] ADVANCED HEADACHE PO) Take 1 tablet by mouth every 8 (eight) hours as needed (pain).   Yes Historical Provider,  MD  Aspirin-Caffeine 400-32 MG TABS Take 2 tablets by mouth daily as needed (migraine.).   Yes Historical Provider, MD  carbamazepine (TEGRETOL) 200 MG tablet Take 200-400 mg by mouth 2 (two) times daily. Take 200 mg every morning and 400 mg at bedtime.   Yes Historical Provider, MD  cetirizine (ZYRTEC) 10 MG tablet Take 10 mg by mouth daily.   Yes Historical Provider, MD   chlorthalidone (HYGROTON) 25 MG tablet Take 25 mg by mouth daily.   Yes Historical Provider, MD  clindamycin (CLEOCIN) 150 MG capsule Take 3 capsules (450 mg total) by mouth 3 (three) times daily. Please take full course 10/13/14  Yes Ernestina Patches, MD  diphenhydrAMINE (BENADRYL) 25 mg capsule Take 25 mg by mouth every 8 (eight) hours as needed for itching.   Yes Historical Provider, MD  fluticasone (FLONASE) 50 MCG/ACT nasal spray Place 1 spray into both nostrils daily.   Yes Historical Provider, MD  furosemide (LASIX) 40 MG tablet Take 40 mg by mouth daily.   Yes Historical Provider, MD  gabapentin (NEURONTIN) 100 MG capsule Take 100 mg by mouth 2 (two) times daily.   Yes Historical Provider, MD  glipiZIDE (GLUCOTROL) 5 MG tablet Take 5 mg by mouth 2 (two) times daily before a meal.    Yes Historical Provider, MD  insulin glargine (LANTUS) 100 UNIT/ML injection Inject 15 Units into the skin at bedtime.   Yes Historical Provider, MD  levothyroxine (SYNTHROID, LEVOTHROID) 50 MCG tablet Take 50 mcg by mouth daily before breakfast.   Yes Historical Provider, MD  meloxicam (MOBIC) 15 MG tablet Take 15 mg by mouth daily.   Yes Historical Provider, MD  Omega-3 Fatty Acids (FISH OIL) 1000 MG CAPS Take 1,000 mg by mouth 2 (two) times daily.   Yes Historical Provider, MD  omeprazole (PRILOSEC) 20 MG capsule Take 20 mg by mouth daily.   Yes Historical Provider, MD  Oxycodone HCl 10 MG TABS Take 10 mg by mouth once.   Yes Historical Provider, MD  sitaGLIPtin-metformin (JANUMET) 50-1000 MG per tablet Take 1 tablet by mouth 2 (two) times daily with a meal.   Yes Historical Provider, MD  tiotropium (SPIRIVA) 18 MCG inhalation capsule Place 18 mcg into inhaler and inhale daily.   Yes Historical Provider, MD  traZODone (DESYREL) 50 MG tablet Take 50 mg by mouth at bedtime.   Yes Historical Provider, MD  albuterol (PROVENTIL) (5 MG/ML) 0.5% nebulizer solution Take 5 mg by nebulization every 4 (four) hours as  needed. Breathing 11/08/11 12/29/12  Melynda Ripple, MD  HYDROcodone-acetaminophen (NORCO) 5-325 MG per tablet Take 1 tablet by mouth every 6 (six) hours as needed. Patient not taking: Reported on 10/23/2014 10/13/14   Ernestina Patches, MD   BP 121/90 mmHg  Pulse 78  Temp(Src) 98.5 F (36.9 C) (Oral)  Resp 16  Ht 5\' 5"  (1.651 m)  Wt 230 lb (104.327 kg)  BMI 38.27 kg/m2  SpO2 96%  LMP 10/09/2014 (Exact Date) Physical Exam  Constitutional: She is oriented to person, place, and time. She appears well-developed and well-nourished. No distress.  HENT:  Head: Normocephalic and atraumatic.  Mouth/Throat: Oropharynx is clear and moist. No oropharyngeal exudate.  Eyes: Right eye exhibits no discharge. Left eye exhibits no discharge. No scleral icterus.  Neck: Normal range of motion.  Cardiovascular: Normal rate, regular rhythm and normal heart sounds.   No murmur heard. Pulmonary/Chest: Effort normal and breath sounds normal. No respiratory distress.  Abdominal: Soft. There is no tenderness.  Musculoskeletal: Normal range of motion. She exhibits no edema or tenderness.  Neurological: She is alert and oriented to person, place, and time. No cranial nerve deficit. Coordination normal.  Skin: Skin is warm and dry. No rash noted. She is not diaphoretic.  Sloughing of skin distally on patient's toes and MTP on right foot. Associated swelling of the second toe with black and yellow, necrotic tissue noted under the toenail. Swelling and erythema noted approximately a foot and associating ankle. Well-demarcated region of previous edge of cellulitis, erythema has gone past proximately to this line.  Psychiatric: She has a normal mood and affect.  Nursing note and vitals reviewed.   ED Course  Procedures (including critical care time) Labs Review Labs Reviewed  COMPREHENSIVE METABOLIC PANEL - Abnormal; Notable for the following:    Sodium 122 (*)    Potassium 3.2 (*)    Chloride 88 (*)    Glucose,  Bld 401 (*)    Creatinine, Ser 1.09 (*)    Calcium 7.9 (*)    Total Protein 8.3 (*)    Albumin 2.4 (*)    Total Bilirubin 0.2 (*)    GFR calc non Af Amer 59 (*)    All other components within normal limits  CBC WITH DIFFERENTIAL/PLATELET - Abnormal; Notable for the following:    WBC 20.0 (*)    RBC 3.01 (*)    Hemoglobin 9.2 (*)    HCT 27.6 (*)    Neutrophils Relative % 89 (*)    Neutro Abs 17.7 (*)    Lymphocytes Relative 6 (*)    All other components within normal limits  URINALYSIS, ROUTINE W REFLEX MICROSCOPIC (NOT AT [facility]) - Abnormal; Notable for the following:    Glucose, UA 250 (*)    Hgb urine dipstick SMALL (*)    Protein, ur 100 (*)    All other components within normal limits  SEDIMENTATION RATE - Abnormal; Notable for the following:    Sed Rate 150 (*)    All other components within normal limits  URINE MICROSCOPIC-ADD ON  C-REACTIVE PROTEIN  OSMOLALITY, URINE  OSMOLALITY  SODIUM, URINE, RANDOM  I-STAT CG4 LACTIC ACID, ED  I-STAT CG4 LACTIC ACID, ED    Imaging Review Dg Foot Complete Right  10/24/2014   CLINICAL DATA:  Cellulitis of the foot. Second toe fracture 2 weeks prior.  EXAM: RIGHT FOOT COMPLETE - 3+ VIEW  COMPARISON:  Radiographs 10/13/2014  FINDINGS: Avulsion fracture from the distal seconds tuft, unchanged in appearance. No acute fracture. 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 No frank osseous erosions or periosteal reaction. Plantar calcaneal spur and Achilles tendon enthesophyte again seen. There is progressive soft tissue edema about the foot. No radiographic findings of soft tissue air or foreign body.  IMPRESSION: 1. Avulsion fracture of the distal second toe, unchanged in alignment. 2. Increased soft tissue edema about the foot. No radiographic findings of osteomyelitis or soft tissue air.   Electronically Signed   By: Jeb Levering M.D.   On: 10/24/2014 01:04     EKG Interpretation None      MDM   Final diagnoses:   Cellulitis of foot   Pt here with worsening of cellulitis of R foot with associated skin sloughing.  Pt failed outpatient abx clindamycin s/p 10 days of tx.  Will admit to medicine for worsening cellulitis with failed out pt abx tx.  patient noted be hyponatremic at 122, this is corrected to 127 based on patient's glucose of 411.  Patient's leukocytosis of 20.  Patient given fluid boluses, began on vancomycin and Zosyn. Patient admitted to medicine for further antibody Therapy and evaluation. The patient appears reasonably stabilized for admission considering the current resources, flow, and capabilities available in the ED at this time, and I doubt any other [facility] requiring further screening and/or treatment in the ED prior to admission.  BP 121/90 mmHg  Pulse 78  Temp(Src) 98.5 F (36.9 C) (Oral)  Resp 16  Ht 5\' 5"  (1.651 m)  Wt 230 lb (104.327 kg)  BMI 38.27 kg/m2  SpO2 96%  LMP 10/09/2014 (Exact Date)  Signed,  Dahlia Bailiff, PA-C 2:45 AM  Patient discussed with Dr. Linton Flemings, MD    Dahlia Bailiff, PA-C 10/24/14 [place], MD 10/24/14 [phone]

## 2014-10-24 ENCOUNTER — Inpatient Hospital Stay (HOSPITAL_COMMUNITY): Payer: Medicare Other | Admitting: Registered Nurse

## 2014-10-24 ENCOUNTER — Encounter (HOSPITAL_COMMUNITY)
Admission: EM | Disposition: A | Discharge status: Skilled Nursing Facility | Payer: Self-pay | Source: Home / Self Care | Attending: Internal Medicine

## 2014-10-24 ENCOUNTER — Encounter (HOSPITAL_COMMUNITY): Payer: Self-pay | Admitting: Anesthesiology

## 2014-10-24 DIAGNOSIS — Z91011 Allergy to milk products: Secondary | ICD-10-CM | POA: Diagnosis not present

## 2014-10-24 DIAGNOSIS — Z888 Allergy status to other drugs, medicaments and biological substances status: Secondary | ICD-10-CM | POA: Diagnosis not present

## 2014-10-24 DIAGNOSIS — E11628 Type 2 diabetes mellitus with other skin complications: Secondary | ICD-10-CM | POA: Diagnosis present

## 2014-10-24 DIAGNOSIS — Z6839 Body mass index (BMI) 39.0-39.9, adult: Secondary | ICD-10-CM | POA: Diagnosis not present

## 2014-10-24 DIAGNOSIS — F603 Borderline personality disorder: Secondary | ICD-10-CM | POA: Diagnosis present

## 2014-10-24 DIAGNOSIS — F429 Obsessive-compulsive disorder, unspecified: Secondary | ICD-10-CM | POA: Diagnosis present

## 2014-10-24 DIAGNOSIS — Z7982 Long term (current) use of aspirin: Secondary | ICD-10-CM | POA: Diagnosis not present

## 2014-10-24 DIAGNOSIS — E1169 Type 2 diabetes mellitus with other specified complication: Secondary | ICD-10-CM | POA: Diagnosis present

## 2014-10-24 DIAGNOSIS — Z794 Long term (current) use of insulin: Secondary | ICD-10-CM | POA: Diagnosis not present

## 2014-10-24 DIAGNOSIS — E876 Hypokalemia: Secondary | ICD-10-CM

## 2014-10-24 DIAGNOSIS — F329 Major depressive disorder, single episode, unspecified: Secondary | ICD-10-CM | POA: Diagnosis present

## 2014-10-24 DIAGNOSIS — J449 Chronic obstructive pulmonary disease, unspecified: Secondary | ICD-10-CM | POA: Diagnosis present

## 2014-10-24 DIAGNOSIS — L03119 Cellulitis of unspecified part of limb: Secondary | ICD-10-CM | POA: Diagnosis present

## 2014-10-24 DIAGNOSIS — Z833 Family history of diabetes mellitus: Secondary | ICD-10-CM | POA: Diagnosis not present

## 2014-10-24 DIAGNOSIS — F419 Anxiety disorder, unspecified: Secondary | ICD-10-CM | POA: Diagnosis present

## 2014-10-24 DIAGNOSIS — L03115 Cellulitis of right lower limb: Secondary | ICD-10-CM | POA: Diagnosis present

## 2014-10-24 DIAGNOSIS — Z79891 Long term (current) use of opiate analgesic: Secondary | ICD-10-CM | POA: Diagnosis not present

## 2014-10-24 DIAGNOSIS — E871 Hypo-osmolality and hyponatremia: Secondary | ICD-10-CM | POA: Diagnosis not present

## 2014-10-24 DIAGNOSIS — F42 Obsessive-compulsive disorder: Secondary | ICD-10-CM | POA: Diagnosis present

## 2014-10-24 DIAGNOSIS — G40909 Epilepsy, unspecified, not intractable, without status epilepticus: Secondary | ICD-10-CM

## 2014-10-24 DIAGNOSIS — B373 Candidiasis of vulva and vagina: Secondary | ICD-10-CM | POA: Diagnosis present

## 2014-10-24 DIAGNOSIS — E1165 Type 2 diabetes mellitus with hyperglycemia: Secondary | ICD-10-CM | POA: Insufficient documentation

## 2014-10-24 DIAGNOSIS — G40802 Other epilepsy, not intractable, without status epilepticus: Secondary | ICD-10-CM | POA: Insufficient documentation

## 2014-10-24 DIAGNOSIS — D509 Iron deficiency anemia, unspecified: Secondary | ICD-10-CM | POA: Diagnosis present

## 2014-10-24 DIAGNOSIS — L089 Local infection of the skin and subcutaneous tissue, unspecified: Secondary | ICD-10-CM | POA: Diagnosis not present

## 2014-10-24 DIAGNOSIS — Z791 Long term (current) use of non-steroidal anti-inflammatories (NSAID): Secondary | ICD-10-CM | POA: Diagnosis not present

## 2014-10-24 DIAGNOSIS — F319 Bipolar disorder, unspecified: Secondary | ICD-10-CM | POA: Diagnosis present

## 2014-10-24 DIAGNOSIS — I10 Essential (primary) hypertension: Secondary | ICD-10-CM | POA: Diagnosis not present

## 2014-10-24 DIAGNOSIS — J45909 Unspecified asthma, uncomplicated: Secondary | ICD-10-CM | POA: Diagnosis present

## 2014-10-24 DIAGNOSIS — Z79899 Other long term (current) drug therapy: Secondary | ICD-10-CM | POA: Diagnosis not present

## 2014-10-24 DIAGNOSIS — E119 Type 2 diabetes mellitus without complications: Secondary | ICD-10-CM | POA: Diagnosis not present

## 2014-10-24 DIAGNOSIS — IMO0002 Reserved for concepts with insufficient information to code with codable children: Secondary | ICD-10-CM | POA: Insufficient documentation

## 2014-10-24 DIAGNOSIS — I96 Gangrene, not elsewhere classified: Secondary | ICD-10-CM | POA: Diagnosis present

## 2014-10-24 DIAGNOSIS — M869 Osteomyelitis, unspecified: Secondary | ICD-10-CM | POA: Diagnosis present

## 2014-10-24 DIAGNOSIS — Z8249 Family history of ischemic heart disease and other diseases of the circulatory system: Secondary | ICD-10-CM | POA: Diagnosis not present

## 2014-10-24 DIAGNOSIS — Z825 Family history of asthma and other chronic lower respiratory diseases: Secondary | ICD-10-CM | POA: Diagnosis not present

## 2014-10-24 DIAGNOSIS — E039 Hypothyroidism, unspecified: Secondary | ICD-10-CM | POA: Diagnosis present

## 2014-10-24 DIAGNOSIS — N179 Acute kidney failure, unspecified: Secondary | ICD-10-CM | POA: Diagnosis not present

## 2014-10-24 DIAGNOSIS — F17210 Nicotine dependence, cigarettes, uncomplicated: Secondary | ICD-10-CM | POA: Diagnosis present

## 2014-10-24 HISTORY — PX: AMPUTATION: SHX166

## 2014-10-24 LAB — SODIUM, URINE, RANDOM: Sodium, Ur: <10 mmol/L

## 2014-10-24 LAB — CBC
HCT: 24.6 % — ABNORMAL LOW (ref 36.0–46.0)
Hemoglobin: 8.1 g/dL — ABNORMAL LOW (ref 12.0–15.0)
MCH: 29.6 pg (ref 26.0–34.0)
MCHC: 32.9 g/dL (ref 30.0–36.0)
MCV: 89.8 fL (ref 78.0–100.0)
Platelets: 242 10*3/uL (ref 150–400)
RBC: 2.74 MIL/uL — ABNORMAL LOW (ref 3.87–5.11)
RDW: 13.9 % (ref 11.5–15.5)
WBC: 15.9 10*3/uL — ABNORMAL HIGH (ref 4.0–10.5)

## 2014-10-24 LAB — I-STAT CG4 LACTIC ACID, ED: Lactic Acid, Venous: 0.99 mmol/L (ref 0.5–2.0)

## 2014-10-24 LAB — GLUCOSE, CAPILLARY
GLUCOSE-CAPILLARY: 327 mg/dL — AB (ref 65–99)
GLUCOSE-CAPILLARY: 200 mg/dL — AB (ref 65–99)
Glucose-Capillary: 358 mg/dL — ABNORMAL HIGH (ref 65–99)
Glucose-Capillary: 341 mg/dL — ABNORMAL HIGH (ref 65–99)
Glucose-Capillary: 322 mg/dL — ABNORMAL HIGH (ref 65–99)
Glucose-Capillary: 244 mg/dL — ABNORMAL HIGH (ref 65–99)
Glucose-Capillary: 211 mg/dL — ABNORMAL HIGH (ref 65–99)

## 2014-10-24 LAB — SEDIMENTATION RATE: Sed Rate: 150 mm/hr — ABNORMAL HIGH (ref 0–22)

## 2014-10-24 LAB — URINALYSIS, ROUTINE W REFLEX MICROSCOPIC
Bilirubin Urine: NEGATIVE
GLUCOSE, UA: 250 mg/dL — AB
Ketones, ur: NEGATIVE mg/dL
LEUKOCYTES UA: NEGATIVE
NITRITE: NEGATIVE
PH: 6 (ref 5.0–8.0)
Protein, ur: 100 mg/dL — AB
SPECIFIC GRAVITY, URINE: 1.013 (ref 1.005–1.030)
Urobilinogen, UA: 0.2 mg/dL (ref 0.0–1.0)

## 2014-10-24 LAB — SURGICAL PCR SCREEN
MRSA, PCR: NEGATIVE
Staphylococcus aureus: NEGATIVE

## 2014-10-24 LAB — BASIC METABOLIC PANEL
ANION GAP: 10 (ref 5–15)
BUN: 14 mg/dL (ref 6–20)
CHLORIDE: 88 mmol/L — AB (ref 101–111)
CO2: 28 mmol/L (ref 22–32)
Calcium: 7.9 mg/dL — ABNORMAL LOW (ref 8.9–10.3)
Creatinine, Ser: 0.95 mg/dL (ref 0.44–1.00)
GFR calc non Af Amer: >60 mL/min (ref >60)
Glucose, Bld: 362 mg/dL — ABNORMAL HIGH (ref 65–99)
POTASSIUM: 3.1 mmol/L — AB (ref 3.5–5.1)
SODIUM: 126 mmol/L — AB (ref 135–145)

## 2014-10-24 LAB — C-REACTIVE PROTEIN: CRP: 29.5 mg/dL — ABNORMAL HIGH (ref <1.0)

## 2014-10-24 LAB — URINE MICROSCOPIC-ADD ON

## 2014-10-24 SURGERY — AMPUTATION, FOOT, RAY
Anesthesia: General | Site: Toe | Laterality: Right

## 2014-10-24 MED ORDER — SODIUM CHLORIDE 0.9 % IJ SOLN: 10.0000 mL | INTRAMUSCULAR | Status: DC | PRN

## 2014-10-24 MED ORDER — OXYCODONE HCL 5 MG PO TABS
5.0000 mg | ORAL_TABLET | ORAL | Status: DC | PRN
  Administered 2014-10-26 – 2014-10-30 (×16): 10 mg via ORAL
  Filled 2014-10-24 (×16): qty 2

## 2014-10-24 MED ORDER — CARBAMAZEPINE 200 MG PO TABS
200.0000 mg | ORAL_TABLET | Freq: Every day | ORAL | Status: DC
  Administered 2014-10-24 – 2014-10-30 (×6): 200 mg via ORAL
  Filled 2014-10-24 (×7): qty 1

## 2014-10-24 MED ORDER — INSULIN ASPART 100 UNIT/ML ~~LOC~~ SOLN
0.0000 [IU] | Freq: Three times a day (TID) | SUBCUTANEOUS | Status: DC
Start: 2014-10-24 — End: 2014-10-24

## 2014-10-24 MED ORDER — FENTANYL CITRATE (PF) 250 MCG/5ML IJ SOLN
INTRAMUSCULAR | Status: AC
  Filled 2014-10-24: qty 5

## 2014-10-24 MED ORDER — ENOXAPARIN SODIUM 30 MG/0.3ML ~~LOC~~ SOLN
30.0000 mg | SUBCUTANEOUS | Status: DC
  Filled 2014-10-24 (×3): qty 0.3

## 2014-10-24 MED ORDER — MELOXICAM 15 MG PO TABS
15.0000 mg | ORAL_TABLET | Freq: Every day | ORAL | Status: DC
  Administered 2014-10-24 – 2014-10-28 (×4): 15 mg via ORAL
  Filled 2014-10-24 (×5): qty 1

## 2014-10-24 MED ORDER — DIPHENHYDRAMINE HCL 50 MG/ML IJ SOLN
INTRAMUSCULAR | Status: AC
  Administered 2014-10-24: 25 mg via INTRAVENOUS
  Filled 2014-10-24: qty 1

## 2014-10-24 MED ORDER — SODIUM CHLORIDE 0.9 % IR SOLN
Status: DC | PRN
  Administered 2014-10-24: 3000 mL

## 2014-10-24 MED ORDER — INSULIN ASPART 100 UNIT/ML ~~LOC~~ SOLN
SUBCUTANEOUS | Status: AC
  Filled 2014-10-24: qty 1

## 2014-10-24 MED ORDER — ONDANSETRON HCL 4 MG PO TABS: 4.0000 mg | ORAL_TABLET | Freq: Four times a day (QID) | ORAL | Status: DC | PRN

## 2014-10-24 MED ORDER — ALBUTEROL SULFATE (2.5 MG/3ML) 0.083% IN NEBU: 3.0000 mL | INHALATION_SOLUTION | Freq: Four times a day (QID) | RESPIRATORY_TRACT | Status: DC | PRN

## 2014-10-24 MED ORDER — PIPERACILLIN-TAZOBACTAM 3.375 G IVPB
3.3750 g | Freq: Once | INTRAVENOUS | Status: AC
  Administered 2014-10-24: 3.375 g via INTRAVENOUS
  Filled 2014-10-24: qty 50

## 2014-10-24 MED ORDER — CETYLPYRIDINIUM CHLORIDE 0.05 % MT LIQD
7.0000 mL | Freq: Two times a day (BID) | OROMUCOSAL | Status: DC
  Administered 2014-10-24 – 2014-10-30 (×13): 7 mL via OROMUCOSAL

## 2014-10-24 MED ORDER — ONDANSETRON HCL 4 MG/2ML IJ SOLN
INTRAMUSCULAR | Status: AC
  Filled 2014-10-24: qty 2

## 2014-10-24 MED ORDER — PANTOPRAZOLE SODIUM 40 MG PO TBEC
40.0000 mg | DELAYED_RELEASE_TABLET | Freq: Every day | ORAL | Status: DC
  Administered 2014-10-24 – 2014-10-30 (×6): 40 mg via ORAL
  Filled 2014-10-24 (×7): qty 1

## 2014-10-24 MED ORDER — ONDANSETRON HCL 4 MG/2ML IJ SOLN: 4.0000 mg | Freq: Four times a day (QID) | INTRAMUSCULAR | Status: DC | PRN

## 2014-10-24 MED ORDER — GABAPENTIN 100 MG PO CAPS
100.0000 mg | ORAL_CAPSULE | Freq: Two times a day (BID) | ORAL | Status: DC
  Administered 2014-10-24 – 2014-10-30 (×12): 100 mg via ORAL
  Filled 2014-10-24 (×14): qty 1

## 2014-10-24 MED ORDER — HYDROMORPHONE HCL 1 MG/ML IJ SOLN
INTRAMUSCULAR | Status: AC
  Filled 2014-10-24: qty 1

## 2014-10-24 MED ORDER — CHLORTHALIDONE 25 MG PO TABS
25.0000 mg | ORAL_TABLET | Freq: Every day | ORAL | Status: DC
  Administered 2014-10-24 – 2014-10-30 (×6): 25 mg via ORAL
  Filled 2014-10-24 (×7): qty 1

## 2014-10-24 MED ORDER — ACETAMINOPHEN 325 MG PO TABS
650.0000 mg | ORAL_TABLET | Freq: Four times a day (QID) | ORAL | Status: DC | PRN
  Administered 2014-10-30: 650 mg via ORAL
  Filled 2014-10-24: qty 2

## 2014-10-24 MED ORDER — MIDAZOLAM HCL 2 MG/2ML IJ SOLN: 0.5000 mg | Freq: Once | INTRAMUSCULAR | Status: DC | PRN

## 2014-10-24 MED ORDER — HYDROMORPHONE HCL 1 MG/ML IJ SOLN
1.0000 mg | INTRAMUSCULAR | Status: DC | PRN
  Administered 2014-10-24 – 2014-10-26 (×9): 1 mg via INTRAVENOUS
  Filled 2014-10-24 (×9): qty 1

## 2014-10-24 MED ORDER — LEVOTHYROXINE SODIUM 50 MCG PO TABS
50.0000 ug | ORAL_TABLET | Freq: Every day | ORAL | Status: DC
  Administered 2014-10-24 – 2014-10-30 (×5): 50 ug via ORAL
  Filled 2014-10-24 (×8): qty 1

## 2014-10-24 MED ORDER — CLINDAMYCIN HCL 300 MG PO CAPS: 450.0000 mg | ORAL_CAPSULE | Freq: Three times a day (TID) | ORAL | Status: DC

## 2014-10-24 MED ORDER — ACETAMINOPHEN 500 MG PO TABS: 1000.0000 mg | ORAL_TABLET | Freq: Four times a day (QID) | ORAL | Status: DC | PRN

## 2014-10-24 MED ORDER — TRAZODONE HCL 50 MG PO TABS
50.0000 mg | ORAL_TABLET | Freq: Every day | ORAL | Status: DC
  Administered 2014-10-24 – 2014-10-29 (×6): 50 mg via ORAL
  Filled 2014-10-24 (×7): qty 1

## 2014-10-24 MED ORDER — PROPOFOL 10 MG/ML IV BOLUS
INTRAVENOUS | Status: AC
  Filled 2014-10-24: qty 20

## 2014-10-24 MED ORDER — HYDROMORPHONE HCL 1 MG/ML IJ SOLN
0.2500 mg | INTRAMUSCULAR | Status: DC | PRN
  Administered 2014-10-24 (×3): 0.5 mg via INTRAVENOUS

## 2014-10-24 MED ORDER — ENOXAPARIN SODIUM 60 MG/0.6ML ~~LOC~~ SOLN
50.0000 mg | Freq: Every day | SUBCUTANEOUS | Status: DC
  Filled 2014-10-24 (×2): qty 0.6

## 2014-10-24 MED ORDER — HYDROCODONE-ACETAMINOPHEN 5-325 MG PO TABS
1.0000 | ORAL_TABLET | Freq: Four times a day (QID) | ORAL | Status: DC | PRN
  Administered 2014-10-25: 2 via ORAL
  Filled 2014-10-24: qty 2

## 2014-10-24 MED ORDER — SODIUM CHLORIDE 0.9 % IV BOLUS (SEPSIS)
1000.0000 mL | Freq: Once | INTRAVENOUS | Status: AC
  Administered 2014-10-24: 1000 mL via INTRAVENOUS

## 2014-10-24 MED ORDER — DIPHENHYDRAMINE HCL 25 MG PO CAPS
25.0000 mg | ORAL_CAPSULE | Freq: Three times a day (TID) | ORAL | Status: DC | PRN
  Administered 2014-10-28: 25 mg via ORAL
  Filled 2014-10-24: qty 1

## 2014-10-24 MED ORDER — METOCLOPRAMIDE HCL 5 MG/ML IJ SOLN: 5.0000 mg | Freq: Three times a day (TID) | INTRAMUSCULAR | Status: DC | PRN

## 2014-10-24 MED ORDER — VANCOMYCIN HCL 10 G IV SOLR
2000.0000 mg | Freq: Once | INTRAVENOUS | Status: DC
  Filled 2014-10-24: qty 2000

## 2014-10-24 MED ORDER — PROPOFOL 10 MG/ML IV BOLUS
INTRAVENOUS | Status: DC | PRN
  Administered 2014-10-24: 150 mg via INTRAVENOUS

## 2014-10-24 MED ORDER — VANCOMYCIN HCL IN DEXTROSE 750-5 MG/150ML-% IV SOLN
750.0000 mg | Freq: Two times a day (BID) | INTRAVENOUS | Status: DC
  Administered 2014-10-24 – 2014-10-28 (×7): 750 mg via INTRAVENOUS
  Filled 2014-10-24 (×11): qty 150

## 2014-10-24 MED ORDER — MIDAZOLAM HCL 5 MG/5ML IJ SOLN
INTRAMUSCULAR | Status: DC | PRN
  Administered 2014-10-24: 2 mg via INTRAVENOUS

## 2014-10-24 MED ORDER — POTASSIUM CHLORIDE CRYS ER 20 MEQ PO TBCR
40.0000 meq | EXTENDED_RELEASE_TABLET | Freq: Once | ORAL | Status: AC
  Administered 2014-10-24: 40 meq via ORAL
  Filled 2014-10-24: qty 2

## 2014-10-24 MED ORDER — VITAMINS A & D EX OINT
TOPICAL_OINTMENT | CUTANEOUS | Status: AC
  Administered 2014-10-24: 5
  Filled 2014-10-24: qty 5

## 2014-10-24 MED ORDER — LIDOCAINE HCL (CARDIAC) 20 MG/ML IV SOLN
INTRAVENOUS | Status: AC
  Filled 2014-10-24: qty 5

## 2014-10-24 MED ORDER — INSULIN GLARGINE 100 UNIT/ML ~~LOC~~ SOLN
15.0000 [IU] | Freq: Every day | SUBCUTANEOUS | Status: DC
  Administered 2014-10-24 – 2014-10-25 (×2): 15 [IU] via SUBCUTANEOUS
  Filled 2014-10-24 (×3): qty 0.15

## 2014-10-24 MED ORDER — ACETAMINOPHEN 650 MG RE SUPP: 650.0000 mg | Freq: Four times a day (QID) | RECTAL | Status: DC | PRN

## 2014-10-24 MED ORDER — SODIUM CHLORIDE 0.9 % IV SOLN
INTRAVENOUS | Status: DC
  Administered 2014-10-24: 18:00:00 via INTRAVENOUS

## 2014-10-24 MED ORDER — HYDROMORPHONE HCL 1 MG/ML IJ SOLN
1.0000 mg | Freq: Once | INTRAMUSCULAR | Status: AC
  Administered 2014-10-24: 1 mg via INTRAVENOUS
  Filled 2014-10-24: qty 1

## 2014-10-24 MED ORDER — PROMETHAZINE HCL 25 MG/ML IJ SOLN: 6.2500 mg | INTRAMUSCULAR | Status: DC | PRN

## 2014-10-24 MED ORDER — TIOTROPIUM BROMIDE MONOHYDRATE 18 MCG IN CAPS
18.0000 ug | ORAL_CAPSULE | Freq: Every day | RESPIRATORY_TRACT | Status: DC
  Administered 2014-10-25 – 2014-10-30 (×6): 18 ug via RESPIRATORY_TRACT
  Filled 2014-10-24 (×2): qty 5

## 2014-10-24 MED ORDER — ONDANSETRON HCL 4 MG/2ML IJ SOLN
INTRAMUSCULAR | Status: DC | PRN
  Administered 2014-10-24: 4 mg via INTRAVENOUS

## 2014-10-24 MED ORDER — OXYCODONE HCL 5 MG PO TABS
10.0000 mg | ORAL_TABLET | Freq: Once | ORAL | Status: AC
  Administered 2014-10-24: 10 mg via ORAL
  Filled 2014-10-24: qty 2

## 2014-10-24 MED ORDER — LACTATED RINGERS IV SOLN
INTRAVENOUS | Status: DC | PRN
  Administered 2014-10-24: 14:00:00 via INTRAVENOUS

## 2014-10-24 MED ORDER — POTASSIUM CHLORIDE IN NACL 20-0.9 MEQ/L-% IV SOLN
INTRAVENOUS | Status: AC
  Administered 2014-10-24: 04:00:00 via INTRAVENOUS
  Filled 2014-10-24: qty 1000

## 2014-10-24 MED ORDER — FLUTICASONE PROPIONATE 50 MCG/ACT NA SUSP
1.0000 | Freq: Every day | NASAL | Status: DC
  Administered 2014-10-24 – 2014-10-30 (×7): 1 via NASAL
  Filled 2014-10-24 (×2): qty 16

## 2014-10-24 MED ORDER — INSULIN ASPART 100 UNIT/ML ~~LOC~~ SOLN
0.0000 [IU] | Freq: Three times a day (TID) | SUBCUTANEOUS | Status: DC
  Administered 2014-10-24: 5 [IU] via SUBCUTANEOUS
  Administered 2014-10-24: 11 [IU] via SUBCUTANEOUS
  Administered 2014-10-24: 12 [IU] via SUBCUTANEOUS
  Administered 2014-10-25: 5 [IU] via SUBCUTANEOUS
  Administered 2014-10-25: 8 [IU] via SUBCUTANEOUS
  Administered 2014-10-25: 11 [IU] via SUBCUTANEOUS
  Administered 2014-10-26: 8 [IU] via SUBCUTANEOUS
  Administered 2014-10-26: 5 [IU] via SUBCUTANEOUS

## 2014-10-24 MED ORDER — VANCOMYCIN HCL 10 G IV SOLR
2000.0000 mg | Freq: Once | INTRAVENOUS | Status: DC
  Administered 2014-10-24: 2000 mg via INTRAVENOUS
  Filled 2014-10-24: qty 2000

## 2014-10-24 MED ORDER — SODIUM CHLORIDE 0.9 % IV SOLN: Freq: Once | INTRAVENOUS | Status: DC

## 2014-10-24 MED ORDER — METOCLOPRAMIDE HCL 10 MG PO TABS: 5.0000 mg | ORAL_TABLET | Freq: Three times a day (TID) | ORAL | Status: DC | PRN

## 2014-10-24 MED ORDER — SODIUM CHLORIDE 0.9 % IJ SOLN
10.0000 mL | Freq: Two times a day (BID) | INTRAMUSCULAR | Status: DC
  Administered 2014-10-24 – 2014-10-27 (×5): 10 mL

## 2014-10-24 MED ORDER — MORPHINE SULFATE 2 MG/ML IJ SOLN
2.0000 mg | INTRAMUSCULAR | Status: DC | PRN
  Administered 2014-10-24 – 2014-10-28 (×5): 2 mg via INTRAVENOUS
  Filled 2014-10-24 (×5): qty 1

## 2014-10-24 MED ORDER — INSULIN ASPART 100 UNIT/ML ~~LOC~~ SOLN
0.0000 [IU] | SUBCUTANEOUS | Status: DC
  Administered 2014-10-24: 9 [IU] via SUBCUTANEOUS

## 2014-10-24 MED ORDER — LIDOCAINE HCL (CARDIAC) 10 MG/ML IV SOLN
INTRAVENOUS | Status: DC | PRN
  Administered 2014-10-24: 100 mg via INTRAVENOUS

## 2014-10-24 MED ORDER — LACTATED RINGERS IV SOLN
INTRAVENOUS | Status: DC
  Administered 2014-10-24: 16:00:00 via INTRAVENOUS

## 2014-10-24 MED ORDER — FENTANYL CITRATE (PF) 100 MCG/2ML IJ SOLN
INTRAMUSCULAR | Status: DC | PRN
  Administered 2014-10-24 (×5): 50 ug via INTRAVENOUS

## 2014-10-24 MED ORDER — ENOXAPARIN SODIUM 40 MG/0.4ML ~~LOC~~ SOLN: 40.0000 mg | SUBCUTANEOUS | Status: DC

## 2014-10-24 MED ORDER — MEPERIDINE HCL 50 MG/ML IJ SOLN
6.2500 mg | INTRAMUSCULAR | Status: DC | PRN
Start: 2014-10-24 — End: 2014-10-24

## 2014-10-24 MED ORDER — INSULIN ASPART 100 UNIT/ML ~~LOC~~ SOLN
0.0000 [IU] | Freq: Every day | SUBCUTANEOUS | Status: DC
  Administered 2014-10-25: 3 [IU] via SUBCUTANEOUS
  Administered 2014-10-27: 2 [IU] via SUBCUTANEOUS
  Administered 2014-10-27: 3 [IU] via SUBCUTANEOUS
  Administered 2014-10-28: 2 [IU] via SUBCUTANEOUS

## 2014-10-24 MED ORDER — PIPERACILLIN-TAZOBACTAM 3.375 G IVPB
3.3750 g | Freq: Three times a day (TID) | INTRAVENOUS | Status: DC
  Administered 2014-10-24 – 2014-10-28 (×12): 3.375 g via INTRAVENOUS
  Filled 2014-10-24 (×19): qty 50

## 2014-10-24 MED ORDER — MIDAZOLAM HCL 2 MG/2ML IJ SOLN
INTRAMUSCULAR | Status: AC
  Filled 2014-10-24: qty 2

## 2014-10-24 MED ORDER — ALBUTEROL SULFATE (2.5 MG/3ML) 0.083% IN NEBU
2.5000 mg | INHALATION_SOLUTION | Freq: Two times a day (BID) | RESPIRATORY_TRACT | Status: DC
  Administered 2014-10-24: 2.5 mg via RESPIRATORY_TRACT
  Filled 2014-10-24: qty 3

## 2014-10-24 MED ORDER — CARBAMAZEPINE 200 MG PO TABS
400.0000 mg | ORAL_TABLET | Freq: Every day | ORAL | Status: DC
  Administered 2014-10-24 – 2014-10-29 (×6): 400 mg via ORAL
  Filled 2014-10-24 (×7): qty 2

## 2014-10-24 MED ORDER — INSULIN ASPART 100 UNIT/ML ~~LOC~~ SOLN: 3.0000 [IU] | Freq: Three times a day (TID) | SUBCUTANEOUS | Status: DC

## 2014-10-24 SURGICAL SUPPLY — 29 items
BAG ZIPLOCK 12X15 (MISCELLANEOUS) ×3 IMPLANT
BANDAGE ELASTIC 6 VELCRO ST LF (GAUZE/BANDAGES/DRESSINGS) ×3 IMPLANT
BNDG COHESIVE 6X5 TAN STRL LF (GAUZE/BANDAGES/DRESSINGS) ×3 IMPLANT
BNDG GAUZE ELAST 4 BULKY (GAUZE/BANDAGES/DRESSINGS) ×9 IMPLANT
DRAIN PENROSE 18X1/2 LTX STRL (DRAIN) IMPLANT
DRSG ADAPTIC 3X8 NADH LF (GAUZE/BANDAGES/DRESSINGS) ×3 IMPLANT
DRSG PAD ABDOMINAL 8X10 ST (GAUZE/BANDAGES/DRESSINGS) ×3 IMPLANT
ELECT REM PT RETURN 9FT ADLT (ELECTROSURGICAL) ×3
ELECTRODE REM PT RTRN 9FT ADLT (ELECTROSURGICAL) ×1 IMPLANT
GAUZE SPONGE 4X4 12PLY STRL (GAUZE/BANDAGES/DRESSINGS) ×3 IMPLANT
GLOVE ORTHO TXT STRL SZ7.5 (GLOVE) ×3 IMPLANT
GLOVE SURG ORTHO 8.5 STRL (GLOVE) ×3 IMPLANT
GOWN STRL REUS W/TWL LRG LVL3 (GOWN DISPOSABLE) ×6 IMPLANT
HANDPIECE INTERPULSE COAX TIP (DISPOSABLE) ×2
MANIFOLD NEPTUNE II (INSTRUMENTS) ×3 IMPLANT
NS IRRIG 1000ML POUR BTL (IV SOLUTION) ×3 IMPLANT
PACK ORTHO EXTREMITY (CUSTOM PROCEDURE TRAY) ×3 IMPLANT
PAD ABD 8X10 STRL (GAUZE/BANDAGES/DRESSINGS) ×9 IMPLANT
POSITIONER SURGICAL ARM (MISCELLANEOUS) ×3 IMPLANT
SET HNDPC FAN SPRY TIP SCT (DISPOSABLE) ×1 IMPLANT
SPONGE LAP 18X18 X RAY DECT (DISPOSABLE) ×3 IMPLANT
STAPLER VISISTAT (STAPLE) ×3 IMPLANT
SUT SILK 2 0 (SUTURE)
SUT SILK 2-0 18XBRD TIE 12 (SUTURE) IMPLANT
SUT VIC AB 0 CT2 27 (SUTURE) ×6 IMPLANT
SUT VIC AB 2-0 CT1 27 (SUTURE) ×4
SUT VIC AB 2-0 CT1 TAPERPNT 27 (SUTURE) ×2 IMPLANT
TOWEL OR 17X26 10 PK STRL BLUE (TOWEL DISPOSABLE) ×6 IMPLANT
WATER STERILE IRR 1500ML POUR (IV SOLUTION) ×3 IMPLANT

## 2014-10-24 NOTE — Brief Op Note (Signed)
10/23/2014 - 10/24/2014  3:01 PM  PATIENT:  Mallory Potts  49 y.o. female  PRE-OPERATIVE DIAGNOSIS:  right second toe gangrene, forefoot infection  POST-OPERATIVE DIAGNOSIS:  right second toe gangrene, severe forefoot and midfoot infection  PROCEDURE:  Procedure(s): AMPUTATION RAY RIGHT SECOND TOE (Right), I+D foot deep, plantar and dorsal  SURGEON:  Surgeon(s) and Role:    * Netta Cedars, MD - Primary  PHYSICIAN ASSISTANT:   ASSISTANTS: none   ANESTHESIA:   general  EBL:     BLOOD ADMINISTERED:none  DRAINS: none   LOCAL MEDICATIONS USED:  NONE  SPECIMEN:  Source of Specimen:  second toe  DISPOSITION OF SPECIMEN:  PATHOLOGY  COUNTS:  YES  TOURNIQUET:  * No tourniquets in log *  DICTATION: .Other Dictation: Dictation Number [id number]  PLAN OF CARE: Admit to inpatient   PATIENT DISPOSITION:  PACU - hemodynamically stable.   Delay start of Pharmacological VTE agent (>24hrs) due to surgical blood loss or risk of bleeding: no

## 2014-10-24 NOTE — H&P (View-Only) (Signed)
Mallory Potts is an 49 y.o. female.    Chief Complaint: right foot swelling and reddness  HPI: Pt is a 49 y.o. female complaining of right foot pain and swelling worse for the last week. Pain had continually increased since the beginning.  Pt has tried various conservative treatments which have failed to alleviate their symptoms, including 1 week of clindamycin and dressing changes. Various options are discussed with the patient. Risks, benefits and expectations were discussed with the patient. Patient understand the risks, benefits and expectations and wishes to proceed with surgery.  Pt will need 2nd toe amputation and IV antibiotics to prevent osteo from worsening.  PCP:  Arnoldo Morale, MD  D/C Plans: Home  PMH: Past Medical History  Diagnosis Date  . Diabetes mellitus   . Asthma   . Arthritis   . Hypertension   . Epilepsy   . Seizures   . Thyroid disease   . COPD (chronic obstructive pulmonary disease)   . Bipolar 1 disorder   . Peripheral edema   . OCD (obsessive compulsive disorder)   . COPD (chronic obstructive pulmonary disease)   . Depression   . Anxiety     PSH: Past Surgical History  Procedure Laterality Date  . Tonsillectomy    . Knee arthroscopy      Social History:  reports that she has been smoking Cigarettes.  She has a 2.5 pack-year smoking history. She does not have any smokeless tobacco history on file. She reports that she does not drink alcohol or use illicit drugs.  Allergies:  Allergies  Allergen Reactions  . Lithium Shortness Of Breath  . Milk-Related Compounds Nausea And Vomiting  . Other     Bell peppers. Mouth sores.    . Abilify [Aripiprazole] Rash  . Aripiprazole Rash    Medications: Current Facility-Administered Medications  Medication Dose Route Frequency Provider Last Rate Last Dose  . 0.9 % NaCl with KCl 20 mEq/ L  infusion   Intravenous Continuous Ripudeep Krystal Eaton, MD 75 mL/hr at 10/24/14 0425    . acetaminophen (TYLENOL) tablet  1,000 mg  1,000 mg Oral Q6H PRN Phillips Grout, MD      . albuterol (PROVENTIL) (2.5 MG/3ML) 0.083% nebulizer solution 2.5 mg  2.5 mg Nebulization q12n4p Phillips Grout, MD   2.5 mg at 10/24/14 0843  . albuterol (PROVENTIL) (2.5 MG/3ML) 0.083% nebulizer solution 3 mL  3 mL Inhalation Q6H PRN Phillips Grout, MD      . antiseptic oral rinse (CPC / CETYLPYRIDINIUM CHLORIDE 0.05%) solution 7 mL  7 mL Mouth Rinse BID Phillips Grout, MD      . carbamazepine (TEGRETOL) tablet 200 mg  200 mg Oral Daily Phillips Grout, MD      . carbamazepine (TEGRETOL) tablet 400 mg  400 mg Oral QHS Phillips Grout, MD      . chlorthalidone (HYGROTON) tablet 25 mg  25 mg Oral Daily Rachal Rayvon Char, MD      . diphenhydrAMINE (BENADRYL) capsule 25 mg  25 mg Oral Q8H PRN Phillips Grout, MD      . enoxaparin (LOVENOX) injection 50 mg  50 mg Subcutaneous Daily Rachal A Shanon Brow, MD      . fluticasone (FLONASE) 50 MCG/ACT nasal spray 1 spray  1 spray Each Nare Daily Rachal A Shanon Brow, MD      . gabapentin (NEURONTIN) capsule 100 mg  100 mg Oral BID Phillips Grout, MD      . HYDROmorphone (  DILAUDID) injection 1 mg  1 mg Intravenous Q4H PRN Phillips Grout, MD   1 mg at 10/24/14 1007  . insulin aspart (novoLOG) injection 0-15 Units  0-15 Units Subcutaneous TID WC Ripudeep K Rai, MD      . insulin aspart (novoLOG) injection 0-5 Units  0-5 Units Subcutaneous QHS Ripudeep K Rai, MD      . insulin glargine (LANTUS) injection 15 Units  15 Units Subcutaneous QHS Rachal A David, MD      . levothyroxine (SYNTHROID, LEVOTHROID) tablet 50 mcg  50 mcg Oral QAC breakfast Phillips Grout, MD      . meloxicam (MOBIC) tablet 15 mg  15 mg Oral Daily Rachal A Shanon Brow, MD      . pantoprazole (PROTONIX) EC tablet 40 mg  40 mg Oral Daily Phillips Grout, MD      . piperacillin-tazobactam (ZOSYN) IVPB 3.375 g  3.375 g Intravenous Q8H Dorrene German, RPH   3.375 g at 10/24/14 0849  . tiotropium (SPIRIVA) inhalation capsule 18 mcg  18 mcg Inhalation Daily Phillips Grout, MD   18 mcg at 10/24/14 0846  . traZODone (DESYREL) tablet 50 mg  50 mg Oral QHS Phillips Grout, MD      . vancomycin (VANCOCIN) 2,000 mg in sodium chloride 0.9 % 500 mL IVPB  2,000 mg Intravenous Once Dorrene German, [facility]      . vancomycin (VANCOCIN) IVPB 750 mg/150 ml premix  750 mg Intravenous Q12H Dorrene German, [facility]        Results for orders placed or performed during the hospital encounter of 10/23/14 (from the past 48 hour(s))  Comprehensive metabolic panel     Status: Abnormal   Collection Time: 10/23/14  8:40 PM  Result Value Ref Range   Sodium 122 (L) 135 - 145 mmol/L   Potassium 3.2 (L) 3.5 - 5.1 mmol/L   Chloride 88 (L) 101 - 111 mmol/L   CO2 24 22 - 32 mmol/L   Glucose, Bld 401 (H) 65 - 99 mg/dL   BUN 15 6 - 20 mg/dL   Creatinine, Ser 1.09 (H) 0.44 - 1.00 mg/dL   Calcium 7.9 (L) 8.9 - 10.3 mg/dL   Total Protein 8.3 (H) 6.5 - 8.1 g/dL   Albumin 2.4 (L) 3.5 - 5.0 g/dL   AST 19 15 - 41 U/L   ALT 16 14 - 54 U/L   Alkaline Phosphatase 63 38 - 126 U/L   Total Bilirubin 0.2 (L) 0.3 - 1.2 mg/dL   GFR calc non Af Amer 59 (L) >60 mL/min   GFR calc Af Amer >60 >60 mL/min    Comment: (NOTE) The eGFR has been calculated using the CKD EPI equation. This calculation has not been validated in all clinical situations. eGFR's persistently <60 mL/min signify possible Chronic Kidney Disease.    Anion gap 10 5 - 15  CBC with Differential     Status: Abnormal   Collection Time: 10/23/14  8:40 PM  Result Value Ref Range   WBC 20.0 (H) 4.0 - 10.5 K/uL   RBC 3.01 (L) 3.87 - 5.11 MIL/uL   Hemoglobin 9.2 (L) 12.0 - 15.0 g/dL   HCT 27.6 (L) 36.0 - 46.0 %   MCV 91.7 78.0 - 100.0 fL   MCH 30.6 26.0 - 34.0 pg   MCHC 33.3 30.0 - 36.0 g/dL   RDW 14.0 11.5 - 15.5 %   Platelets 270 150 - 400 K/uL   Neutrophils Relative %  89 (H) 43 - 77 %   Neutro Abs 17.7 (H) 1.7 - 7.7 K/uL   Lymphocytes Relative 6 (L) 12 - 46 %   Lymphs Abs 1.2 0.7 - 4.0 K/uL   Monocytes Relative 5 3 - 12 %     Monocytes Absolute 1.0 0.1 - 1.0 K/uL   Eosinophils Relative 0 0 - 5 %   Eosinophils Absolute 0.0 0.0 - 0.7 K/uL   Basophils Relative 0 0 - 1 %   Basophils Absolute 0.0 0.0 - 0.1 K/uL  I-Stat CG4 Lactic Acid, ED (Not at [facility] or [facility])     Status: None   Collection Time: 10/23/14  8:54 PM  Result Value Ref Range   Lactic Acid, Venous 0.90 0.5 - 2.0 mmol/L  Sedimentation rate     Status: Abnormal   Collection Time: 10/24/14 12:06 AM  Result Value Ref Range   Sed Rate 150 (H) 0 - 22 mm/hr  I-Stat CG4 Lactic Acid, ED (Not at [facility] or [facility])     Status: None   Collection Time: 10/24/14 12:12 AM  Result Value Ref Range   Lactic Acid, Venous 0.99 0.5 - 2.0 mmol/L  Urinalysis, Routine w reflex microscopic     Status: Abnormal   Collection Time: 10/24/14  1:08 AM  Result Value Ref Range   Color, Urine YELLOW YELLOW   APPearance CLEAR CLEAR   Specific Gravity, Urine 1.013 1.005 - 1.030   pH 6.0 5.0 - 8.0   Glucose, UA 250 (A) NEGATIVE mg/dL   Hgb urine dipstick SMALL (A) NEGATIVE   Bilirubin Urine NEGATIVE NEGATIVE   Ketones, ur NEGATIVE NEGATIVE mg/dL   Protein, ur 100 (A) NEGATIVE mg/dL   Urobilinogen, UA 0.2 0.0 - 1.0 mg/dL   Nitrite NEGATIVE NEGATIVE   Leukocytes, UA NEGATIVE NEGATIVE  Urine microscopic-add on     Status: None   Collection Time: 10/24/14  1:08 AM  Result Value Ref Range   WBC, UA 0-2 <3 WBC/hpf   RBC / HPF 0-2 <3 RBC/hpf   Urine-Other AMORPHOUS URATES/PHOSPHATES   CBC     Status: Abnormal   Collection Time: 10/24/14  5:01 AM  Result Value Ref Range   WBC 15.9 (H) 4.0 - 10.5 K/uL   RBC 2.74 (L) 3.87 - 5.11 MIL/uL   Hemoglobin 8.1 (L) 12.0 - 15.0 g/dL   HCT 24.6 (L) 36.0 - 46.0 %   MCV 89.8 78.0 - 100.0 fL   MCH 29.6 26.0 - 34.0 pg   MCHC 32.9 30.0 - 36.0 g/dL   RDW 13.9 11.5 - 15.5 %   Platelets 242 150 - 400 K/uL  Basic metabolic panel     Status: Abnormal   Collection Time: 10/24/14  5:01 AM  Result Value Ref Range   Sodium 126 (L) 135 - 145 mmol/L    Potassium 3.1 (L) 3.5 - 5.1 mmol/L   Chloride 88 (L) 101 - 111 mmol/L   CO2 28 22 - 32 mmol/L   Glucose, Bld 362 (H) 65 - 99 mg/dL   BUN 14 6 - 20 mg/dL   Creatinine, Ser 0.95 0.44 - 1.00 mg/dL   Calcium 7.9 (L) 8.9 - 10.3 mg/dL   GFR calc non Af Amer >60 >60 mL/min   GFR calc Af Amer >60 >60 mL/min    Comment: (NOTE) The eGFR has been calculated using the CKD EPI equation. This calculation has not been validated in all clinical situations. eGFR's persistently <60 mL/min signify possible Chronic Kidney Disease.  Anion gap 10 5 - 15  Glucose, capillary     Status: Abnormal   Collection Time: 10/24/14  5:54 AM  Result Value Ref Range   Glucose-Capillary 358 (H) 65 - 99 mg/dL   Comment 1 Notify RN    Comment 2 Document in Chart   Glucose, capillary     Status: Abnormal   Collection Time: 10/24/14  7:30 AM  Result Value Ref Range   Glucose-Capillary 341 (H) 65 - 99 mg/dL   Comment 1 Notify RN    Comment 2 Document in Chart    Dg Foot Complete Right  10/24/2014   CLINICAL DATA:  Cellulitis of the foot. Second toe fracture 2 weeks prior.  EXAM: RIGHT FOOT COMPLETE - 3+ VIEW  COMPARISON:  Radiographs 10/13/2014  FINDINGS: Avulsion fracture from the distal seconds tuft, unchanged in appearance. No acute fracture. 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 No frank osseous erosions or periosteal reaction. Plantar calcaneal spur and Achilles tendon enthesophyte again seen. There is progressive soft tissue edema about the foot. No radiographic findings of soft tissue air or foreign body.  IMPRESSION: 1. Avulsion fracture of the distal second toe, unchanged in alignment. 2. Increased soft tissue edema about the foot. No radiographic findings of osteomyelitis or soft tissue air.   Electronically Signed   By: Jeb Levering M.D.   On: 10/24/2014 01:04    ROS: Pain with rom of the right lower extremity  Physical Exam:  Alert and oriented 49 y.o. female in no acute  distress Cranial nerves 2-12 intact Cervical spine: full rom with no tenderness, nv intact distally Chest: active breath sounds bilaterally, no wheeze rhonchi or rales Heart: regular rate and rhythm, no murmur Abd: non tender non distended with active bowel sounds Hip is stable with rom  Right foot with obvious smell of infection and probable osteo 2nd toe with moderate erythema, no sensation and necrosis to end of the toe Erythema extends to mid foot with moderate maceration and some drainage from web spaces of toes 3-5 nv intact to other toes Currently great to is unaffected  Assessment/Plan Assessment: 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 Veverly Fells later today. Risks benefits and expectations were discussed with the patient. Patient understand risks, benefits and expectations and wishes to proceed. Keep npo  Continue IV antibiotics  New dressing daily

## 2014-10-24 NOTE — Anesthesia Procedure Notes (Signed)
Procedure Name: LMA Insertion Date/Time: 10/24/2014 2:22 PM Performed by: Lissa Morales Pre-anesthesia Checklist: Patient identified, Emergency Drugs available, Suction available and Patient being monitored Patient Re-evaluated:Patient Re-evaluated prior to inductionOxygen Delivery Method: Circle System Utilized Preoxygenation: Pre-oxygenation with 100% oxygen Intubation Type: IV induction Ventilation: Mask ventilation without difficulty LMA: LMA with gastric port inserted LMA Size: 4.0 Tube type: Oral Number of attempts: 1 Placement Confirmation: positive ETCO2 and breath sounds checked- equal and bilateral Tube secured with: Tape Dental Injury: Teeth and Oropharynx as per pre-operative assessment

## 2014-10-24 NOTE — ED Notes (Signed)
Called to give report to unit. Secretary stated the nurse will call back.

## 2014-10-24 NOTE — Progress Notes (Addendum)
ANTIBIOTIC CONSULT NOTE - INITIAL  Pharmacy Consult for Vancomycin/Zosyn Indication: Cellulitis of LLE  Allergies  Allergen Reactions  . Lithium Shortness Of Breath  . Milk-Related Compounds Nausea And Vomiting  . Other     Bell peppers. Mouth sores.    . Abilify [Aripiprazole] Rash  . Aripiprazole Rash    Patient Measurements: Height: 5\' 5"  (165.1 cm) IBW/kg (Calculated) : 57   Vital Signs: Temp: 99.1 F (37.3 C) (06/03 2245) Temp Source: Oral (06/03 2245) BP: 125/67 mmHg (06/04 0100) Pulse Rate: 80 (06/04 0100) Intake/Output from previous day:   Intake/Output from this shift:    Labs:  Recent Labs  10/23/14 2040  WBC 20.0*  HGB 9.2*  PLT 270  CREATININE 1.09*   CrCl cannot be calculated (Unknown ideal weight.). No results for input(s): VANCOTROUGH, VANCOPEAK, VANCORANDOM, GENTTROUGH, GENTPEAK, GENTRANDOM, TOBRATROUGH, TOBRAPEAK, TOBRARND, AMIKACINPEAK, AMIKACINTROU, AMIKACIN in the last 72 hours.   Microbiology: No results found for this or any previous visit (from the past 720 hour(s)).  Medical History: Past Medical History  Diagnosis Date  . Diabetes mellitus   . Asthma   . Arthritis   . Hypertension   . Epilepsy   . Seizures   . Thyroid disease   . COPD (chronic obstructive pulmonary disease)   . Bipolar 1 disorder   . Peripheral edema   . OCD (obsessive compulsive disorder)   . COPD (chronic obstructive pulmonary disease)   . Depression   . Anxiety     Medications:   (Not in a hospital admission) Scheduled:   Infusions:  . piperacillin-tazobactam (ZOSYN)  IV 3.375 g (10/24/14 0028)  . sodium chloride    . vancomycin     Assessment: 42 yoF presents with worsening cellultis of right foot after breaking toe 2 weeks ago.  Vancomycin/Zosyn per Rx for RLE.   Goal of Therapy:  Vancomycin trough level 10-15 mcg/ml  Plan:   Vancomycin 2Gm x1 then 750mg  IV q12h  Zosyn 3.375 Gm IV q8h EI  F/u scr/cultures/levels  Lawana Pai  R 10/24/2014,2:12 AM

## 2014-10-24 NOTE — Progress Notes (Signed)
[facility]                                                                              Patient Demographics  Mallory Potts, is a 49 y.o. female, DOB - [birth year], [id number]  Admit date - 10/23/2014   Admitting Physician Phillips Grout, MD  Outpatient Primary MD for the patient is Arnoldo Morale, MD  LOS - 0   Chief Complaint  Patient presents with  . Cellulitis in foot        Brief HPI   Patient is a 49 year old female with insulin-dependent diabetes mellitus, COPD, hypertension presented with right foot pain and swelling despite outpatient antibiotics. Patient noted that approximately 2 weeks ago she had hit her second toe and broke the distal portion of the toe. Since then she's been having pain and swelling which has been progressively worsening. 10 days ago she noticed swelling which has now progressed up to her foot. She was seen in ED on 5/24. She was diagnosed with cellulitis and placed on oral clindamycin. Patient reports that over the last 4 days prior to admission, she noticed worsening of the pain and swelling and hence she presented to the ER for further workup.   Assessment & Plan    Principal Problem:   Cellulitis of right foot with right second toe gangrene, osteomyelitis of right second toe - Continue IV vancomycin and Zosyn - Called orthopedic consult this morning, obtain MRI of the right foot - Per ortho, keep NPO, plan for right second toe amputation today  Active Problems:   DM (diabetes mellitus) - Placed on Levemir, sliding scale insulin, currently NPO  -  hold all oral hypoglycemics    Epilepsy Continue Tegretol    COPD (chronic obstructive pulmonary disease) - Currently stable, continue albuterol nebs as needed, Spiriva    Bipolar 1 disorder - Continue trazodone  Hypothyroidism  - Continue Synthroid   Hypokalemia  - Continue replacement   Obesity - Counseled patient on diet and weight control  Code Status: Full code    Family Communication: Discussed in detail with the patient, all imaging results, lab results explained to the patient   Disposition Plan:   Time Spent in minutes   25 minutes  Procedures  Mri rt foot  Consults   ortho  DVT Prophylaxis  scds  Medications  Scheduled Meds: . albuterol  2.5 mg Nebulization q12n4p  . antiseptic oral rinse  7 mL Mouth Rinse BID  . carbamazepine  200 mg Oral Daily  . carbamazepine  400 mg Oral QHS  . chlorthalidone  25 mg Oral Daily  . enoxaparin (LOVENOX) injection  50 mg Subcutaneous Daily  . fluticasone  1 spray Each Nare Daily  . gabapentin  100 mg Oral BID  . insulin aspart  0-15 Units Subcutaneous TID WC  . insulin aspart  0-5 Units Subcutaneous QHS  . insulin glargine  15 Units Subcutaneous QHS  . levothyroxine  50 mcg Oral QAC breakfast  . meloxicam  15 mg Oral Daily  . pantoprazole  40 mg Oral Daily  . piperacillin-tazobactam (ZOSYN)  IV  3.375 g Intravenous Q8H  .  tiotropium  18 mcg Inhalation Daily  . traZODone  50 mg Oral QHS  . vancomycin  2,000 mg Intravenous Once  . vancomycin  750 mg Intravenous Q12H   Continuous Infusions: . 0.9 % NaCl with KCl 20 mEq / L 75 mL/hr at 10/24/14 0425   PRN Meds:.acetaminophen, albuterol, diphenhydrAMINE, HYDROmorphone (DILAUDID) injection   Antibiotics   Anti-infectives    Start     Dose/Rate Route Frequency Ordered Stop   10/24/14 1800  vancomycin (VANCOCIN) IVPB 750 mg/150 ml premix     750 mg 150 mL/hr over 60 Minutes Intravenous Every 12 hours 10/24/14 0410     10/24/14 1000  clindamycin (CLEOCIN) capsule 450 mg  Status:  Discontinued     450 mg Oral 3 times daily 10/24/14 0346 10/24/14 0401   10/24/14 0800  piperacillin-tazobactam (ZOSYN) IVPB 3.375 g     3.375 g 12.5 mL/hr over 240 Minutes Intravenous Every 8 hours 10/24/14 0356     10/24/14 0400  vancomycin (VANCOCIN) 2,000 mg in sodium chloride 0.9 % 500 mL IVPB     2,000 mg 250 mL/hr over 120 Minutes Intravenous  Once  10/24/14 0355     10/24/14 0130  vancomycin (VANCOCIN) 2,000 mg in sodium chloride 0.9 % 500 mL IVPB  Status:  Discontinued     2,000 mg 250 mL/hr over 120 Minutes Intravenous  Once 10/24/14 0121 10/24/14 0625   10/24/14 0015  piperacillin-tazobactam (ZOSYN) IVPB 3.375 g     3.375 g 12.5 mL/hr over 240 Minutes Intravenous  Once 10/24/14 0008 10/24/14 0217        Subjective:   Anea Latterell was seen and examined today. Tearful about her right foot swelling and pain.  Patient denies dizziness, chest pain, shortness of breath, abdominal pain, N/V/D/C, new weakness, numbess, tingling. No acute events overnight.    Objective:   Blood pressure 125/64, pulse 86, temperature 98.2 F (36.8 C), temperature source Oral, resp. rate 16, height 5\' 5"  (1.651 m), weight 104.327 kg (230 lb), last menstrual period 10/09/2014, SpO2 95 %.  Wt Readings from Last 3 Encounters:  10/24/14 104.327 kg (230 lb)  02/25/13 107.684 kg (237 lb 6.4 oz)  07/25/11 108.863 kg (240 lb)    No intake or output data in the 24 hours ending 10/24/14 1027  Exam  General: Alert and oriented x 3, NAD, anxious and tearful   HEENT:  PERRLA, EOMI, Anicteic Sclera, mucous membranes moist.   Neck: Supple, no JVD, no masses  CVS: S1 S2 auscultated, no rubs, murmurs or gallops. Regular rate and rhythm.  Respiratory: Clear to auscultation bilaterally, no wheezing, rales or rhonchi  Abdomen: Soft, nontender, nondistended, + bowel sounds  Ext: no cyanosis clubbing or edema  Neuro: AAOx3, Cr N's II- XII. Strength 5/5 upper and lower extremities bilaterally  Skin: right foot with swelling, moderate erythema, second toe necrosis, maceration and drainage   Psych: Anxious and oriented   Data Review   Micro Results No results found for this or any previous visit (from the past 240 hour(s)).  Radiology Reports Dg Foot Complete Right  10/24/2014   CLINICAL DATA:  Cellulitis of the foot. Second toe fracture 2 weeks prior.   EXAM: RIGHT FOOT COMPLETE - 3+ VIEW  COMPARISON:  Radiographs 10/13/2014  FINDINGS: Avulsion fracture from the distal seconds tuft, unchanged in appearance. No acute fracture. 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 No frank osseous erosions or periosteal reaction. Plantar calcaneal spur and Achilles tendon enthesophyte again seen. There is  progressive soft tissue edema about the foot. No radiographic findings of soft tissue air or foreign body.  IMPRESSION: 1. Avulsion fracture of the distal second toe, unchanged in alignment. 2. Increased soft tissue edema about the foot. No radiographic findings of osteomyelitis or soft tissue air.   Electronically Signed   By: Jeb Levering M.D.   On: 10/24/2014 01:04   Dg Foot Complete Right  10/13/2014   CLINICAL DATA:  Second toe right foot injury 2 weeks ago  EXAM: RIGHT FOOT COMPLETE - 3+ VIEW  COMPARISON:  None.  FINDINGS: Three views of the right foot submitted. There is avulsion fracture at the tip of distal phalanx second toe. There is soft tissue swelling and soft tissue irregularity second toe.  IMPRESSION: Avulsion fracture at the tip of distal phalanx second toe. Soft tissue swelling noted second toe.   Electronically Signed   By: Lahoma Crocker M.D.   On: 10/13/2014 18:28    CBC  Recent Labs Lab 10/23/14 2040 10/24/14 0501  WBC 20.0* 15.9*  HGB 9.2* 8.1*  HCT 27.6* 24.6*  PLT 270 242  MCV 91.7 89.8  MCH 30.6 29.6  MCHC 33.3 32.9  RDW 14.0 13.9  LYMPHSABS 1.2  --   MONOABS 1.0  --   EOSABS 0.0  --   BASOSABS 0.0  --     Chemistries   Recent Labs Lab 10/23/14 2040 10/24/14 0501  NA 122* 126*  K 3.2* 3.1*  CL 88* 88*  CO2 24 28  GLUCOSE 401* 362*  BUN 15 14  CREATININE 1.09* 0.95  CALCIUM 7.9* 7.9*  AST 19  --   ALT 16  --   ALKPHOS 63  --   BILITOT 0.2*  --    ------------------------------------------------------------------------------------------------------------------ estimated creatinine clearance is  86.8 mL/min (by C-G formula based on Cr of 0.95). ------------------------------------------------------------------------------------------------------------------ No results for input(s): HGBA1C in the last 72 hours. ------------------------------------------------------------------------------------------------------------------ No results for input(s): CHOL, HDL, LDLCALC, TRIG, CHOLHDL, LDLDIRECT in the last 72 hours. ------------------------------------------------------------------------------------------------------------------ No results for input(s): TSH, T4TOTAL, T3FREE, THYROIDAB in the last 72 hours.  Invalid input(s): FREET3 ------------------------------------------------------------------------------------------------------------------ No results for input(s): VITAMINB12, FOLATE, FERRITIN, TIBC, IRON, RETICCTPCT in the last 72 hours.  Coagulation profile No results for input(s): INR, PROTIME in the last 168 hours.  No results for input(s): DDIMER in the last 72 hours.  Cardiac Enzymes No results for input(s): CKMB, TROPONINI, MYOGLOBIN in the last 168 hours.  Invalid input(s): CK ------------------------------------------------------------------------------------------------------------------ Invalid input(s): POCBNP   Recent Labs  10/24/14 0554 10/24/14 0730  GLUCAP 358* 341*     RAI,RIPUDEEP M.D. [facility] 10/24/2014, 10:27 AM  Pager: [phone]   Between 7am to 7pm - call Pager - [phone]  After 7pm go to [email or web address] - password [email or web address]  Call night coverage person covering after 7pm

## 2014-10-24 NOTE — Transfer of Care (Signed)
Immediate Anesthesia Transfer of Care Note  Patient: Mallory Potts  Procedure(s) Performed: Procedure(s): AMPUTATION RAY RIGHT SECOND TOE (Right)  Patient Location: PACU  Anesthesia Type:General  Level of Consciousness: awake, alert , oriented and patient cooperative  Airway & Oxygen Therapy: Patient Spontanous Breathing and Patient connected to face mask oxygen  Post-op Assessment: Report given to RN, Post -op Vital signs reviewed and stable and Patient moving all extremities X 4  Post vital signs: stable  Last Vitals:  Filed Vitals:   10/24/14 1318  BP: 112/57  Pulse: 78  Temp: 37.1 C  Resp: 20    Complications: No apparent anesthesia complications

## 2014-10-24 NOTE — Progress Notes (Signed)
Peripherally Inserted Central Catheter/Midline Placement  The IV Nurse has discussed with the patient and/or persons authorized to consent for the patient, the purpose of this procedure and the potential benefits and risks involved with this procedure.  The benefits include less needle sticks, lab draws from the catheter and patient may be discharged home with the catheter.  Risks include, but not limited to, infection, bleeding, blood clot (thrombus formation), and puncture of an artery; nerve damage and irregular heat beat.  Alternatives to this procedure were also discussed.  PICC/Midline Placement Documentation  PICC / Midline Single Lumen 10/24/14 (Active)  Indication for Insertion or Continuance of Line Administration of hyperosmolar/irritating solutions (i.e. TPN, Vancomycin, etc.) 10/24/2014 12:51 PM  Exposed Catheter (cm) 0 cm 10/24/2014 12:51 PM  Site Assessment Clean;Dry;Intact 10/24/2014 12:51 PM  Line Status Flushed;Saline locked;Blood return noted 10/24/2014 12:51 PM  Dressing Type Transparent 10/24/2014 12:51 PM  Dressing Change Due 10/31/14 10/24/2014 12:51 PM       Mallory Potts 10/24/2014, 1:10 PM

## 2014-10-24 NOTE — Progress Notes (Signed)
Orthopedics Progress Note  Subjective: Patient complains of right foot pain.   Objective:  Filed Vitals:   10/24/14 2111  BP: 114/67  Pulse: 93  Temp: 99.6 F (37.6 C)  Resp: 20    General: Awake and alert  Musculoskeletal: right foot elevated and splinted, bandages with some saturation. Neurovascularly intact  Lab Results  Component Value Date   WBC 15.9* 10/24/2014   HGB 8.1* 10/24/2014   HCT 24.6* 10/24/2014   MCV 89.8 10/24/2014   PLT 242 10/24/2014       Component Value Date/Time   NA 126* 10/24/2014 0501   K 3.1* 10/24/2014 0501   CL 88* 10/24/2014 0501   CO2 28 10/24/2014 0501   GLUCOSE 362* 10/24/2014 0501   BUN 14 10/24/2014 0501   CREATININE 0.95 10/24/2014 0501   CALCIUM 7.9* 10/24/2014 0501   GFRNONAA >60 10/24/2014 0501   GFRAA >60 10/24/2014 0501    Lab Results  Component Value Date   INR 1.02 06/19/2011    Assessment/Plan: s/p Procedure(s): AMPUTATION RAY RIGHT SECOND TOE I had a frank discussion with Ms Toia that based upon what I saw in surgery today, she will need a BKA amputation.  This needs to happy either tomorrow or Monday at the latest. There is no way to salvage the mid or hindfoot with advanced necrosis of some of that tissue and skin.  BKA would have the best chance for healing and definitive treatment.  She understands. WIll tentatively plan for  Tomorrow.  Doran Heater. Veverly Fells, MD 10/24/2014 11:32 PM

## 2014-10-24 NOTE — Anesthesia Postprocedure Evaluation (Signed)
Anesthesia Post Note  Patient: Mallory Potts  Procedure(s) Performed: Procedure(s) (LRB): AMPUTATION RAY RIGHT SECOND TOE (Right)  Anesthesia type: General  Patient location: PACU  Post pain: Pain level controlled  Post assessment: Post-op Vital signs reviewed  Last Vitals:  Filed Vitals:   10/24/14 1508  BP: 140/75  Pulse: 87  Temp: 37.3 C  Resp: 22    Post vital signs: Reviewed  Level of consciousness: sedated  Complications: No apparent anesthesia complications

## 2014-10-24 NOTE — H&P (Signed)
PCP:   Arnoldo Morale, MD   Chief Complaint:  Left foot worse  HPI: 49 yo female IDDM, copd, htn, comes in with worsening left foot swelling and redness despite over 7 days of po clindamycin.  Pt reports she has been taking her antibiotics but the foot is getting worse.  The redness is up past the ankle now.  She had an injury and stubbed her toe which ripped some skin off, now the toe is blackish.  No n/v.  No fevers at home.  Glucose have been more elevated than usual.  Asked to admit pt for failed outpt treatment and worsening cellulits/wound to foot.  Review of Systems:  Positive and negative as per HPI otherwise all other systems are negative  Past Medical History: Past Medical History  Diagnosis Date  . Diabetes mellitus   . Asthma   . Arthritis   . Hypertension   . Epilepsy   . Seizures   . Thyroid disease   . COPD (chronic obstructive pulmonary disease)   . Bipolar 1 disorder   . Peripheral edema   . OCD (obsessive compulsive disorder)   . COPD (chronic obstructive pulmonary disease)   . Depression   . Anxiety    Past Surgical History  Procedure Laterality Date  . Tonsillectomy    . Knee arthroscopy      Medications: Prior to Admission medications   Medication Sig Start Date End Date Taking? Authorizing Provider  acetaminophen (TYLENOL) 500 MG tablet Take 1,000 mg by mouth every 6 (six) hours as needed for mild pain, moderate pain or headache.   Yes Historical Provider, MD  albuterol (PROVENTIL HFA;VENTOLIN HFA) 108 (90 BASE) MCG/ACT inhaler Inhale 2 puffs into the lungs every 6 (six) hours as needed for wheezing. Breathing 11/08/11  Yes Melynda Ripple, MD  albuterol (PROVENTIL) (2.5 MG/3ML) 0.083% nebulizer solution Take 2.5 mg by nebulization 2 times daily at 12 noon and 4 pm.   Yes Historical Provider, MD  Aspirin-Acetaminophen-Caffeine [facility] ADVANCED HEADACHE PO) Take 1 tablet by mouth every 8 (eight) hours as needed (pain).   Yes Historical Provider, MD   Aspirin-Caffeine 400-32 MG TABS Take 2 tablets by mouth daily as needed (migraine.).   Yes Historical Provider, MD  carbamazepine (TEGRETOL) 200 MG tablet Take 200-400 mg by mouth 2 (two) times daily. Take 200 mg every morning and 400 mg at bedtime.   Yes Historical Provider, MD  cetirizine (ZYRTEC) 10 MG tablet Take 10 mg by mouth daily.   Yes Historical Provider, MD  chlorthalidone (HYGROTON) 25 MG tablet Take 25 mg by mouth daily.   Yes Historical Provider, MD  clindamycin (CLEOCIN) 150 MG capsule Take 3 capsules (450 mg total) by mouth 3 (three) times daily. Please take full course 10/13/14  Yes Ernestina Patches, MD  diphenhydrAMINE (BENADRYL) 25 mg capsule Take 25 mg by mouth every 8 (eight) hours as needed for itching.   Yes Historical Provider, MD  fluticasone (FLONASE) 50 MCG/ACT nasal spray Place 1 spray into both nostrils daily.   Yes Historical Provider, MD  furosemide (LASIX) 40 MG tablet Take 40 mg by mouth daily.   Yes Historical Provider, MD  gabapentin (NEURONTIN) 100 MG capsule Take 100 mg by mouth 2 (two) times daily.   Yes Historical Provider, MD  glipiZIDE (GLUCOTROL) 5 MG tablet Take 5 mg by mouth 2 (two) times daily before a meal.    Yes Historical Provider, MD  insulin glargine (LANTUS) 100 UNIT/ML injection Inject 15 Units into the skin  at bedtime.   Yes Historical Provider, MD  levothyroxine (SYNTHROID, LEVOTHROID) 50 MCG tablet Take 50 mcg by mouth daily before breakfast.   Yes Historical Provider, MD  meloxicam (MOBIC) 15 MG tablet Take 15 mg by mouth daily.   Yes Historical Provider, MD  Omega-3 Fatty Acids (FISH OIL) 1000 MG CAPS Take 1,000 mg by mouth 2 (two) times daily.   Yes Historical Provider, MD  omeprazole (PRILOSEC) 20 MG capsule Take 20 mg by mouth daily.   Yes Historical Provider, MD  Oxycodone HCl 10 MG TABS Take 10 mg by mouth once.   Yes Historical Provider, MD  sitaGLIPtin-metformin (JANUMET) 50-1000 MG per tablet Take 1 tablet by mouth 2 (two) times daily  with a meal.   Yes Historical Provider, MD  tiotropium (SPIRIVA) 18 MCG inhalation capsule Place 18 mcg into inhaler and inhale daily.   Yes Historical Provider, MD  traZODone (DESYREL) 50 MG tablet Take 50 mg by mouth at bedtime.   Yes Historical Provider, MD  albuterol (PROVENTIL) (5 MG/ML) 0.5% nebulizer solution Take 5 mg by nebulization every 4 (four) hours as needed. Breathing 11/08/11 12/29/12  Melynda Ripple, MD  HYDROcodone-acetaminophen (NORCO) 5-325 MG per tablet Take 1 tablet by mouth every 6 (six) hours as needed. Patient not taking: Reported on 10/23/2014 10/13/14   Ernestina Patches, MD    Allergies:   Allergies  Allergen Reactions  . Lithium Shortness Of Breath  . Milk-Related Compounds Nausea And Vomiting  . Other     Bell peppers. Mouth sores.    . Abilify [Aripiprazole] Rash  . Aripiprazole Rash    Social History:  reports that she has been smoking Cigarettes.  She has a 2.5 pack-year smoking history. She does not have any smokeless tobacco history on file. She reports that she does not drink alcohol or use illicit drugs.  Family History: Family History  Problem Relation Age of Onset  . Asthma Other   . Diabetes Other   . Hypertension Other     Physical Exam: Filed Vitals:   10/23/14 2300 10/23/14 2330 10/24/14 0000 10/24/14 0108  BP: 126/70 122/73 133/64   Pulse: 90 88 89   Temp:      TempSrc:      Resp: 19 16 14    Height:    5\' 5"  (1.651 m)  SpO2: 97% 93% 94%    General appearance: alert, cooperative and no distress Head: Normocephalic, without obvious abnormality, atraumatic Eyes: negative Nose: Nares normal. Septum midline. Mucosa normal. No drainage or sinus tenderness. Neck: no JVD and supple, symmetrical, trachea midline Lungs: clear to auscultation bilaterally Heart: regular rate and rhythm, S1, S2 normal, no murmur, click, rub or gallop Abdomen: soft, non-tender; bowel sounds normal; no masses,  no organomegaly Extremities: right leg normal left  foot swollen and red up to mid calf wound to 2nd toe with necrotic tissue present Pulses: 2+ and symmetric Skin: Skin color, texture, turgor normal. No rashes or lesions x above Neurologic: Grossly normal    Labs on Admission:   Recent Labs  10/23/14 2040  NA 122*  K 3.2*  CL 88*  CO2 24  GLUCOSE 401*  BUN 15  CREATININE 1.09*  CALCIUM 7.9*    Recent Labs  10/23/14 2040  AST 19  ALT 16  ALKPHOS 63  BILITOT 0.2*  PROT 8.3*  ALBUMIN 2.4*    Recent Labs  10/23/14 2040  WBC 20.0*  NEUTROABS 17.7*  HGB 9.2*  HCT 27.6*  MCV 91.7  PLT 270   Radiological Exams on Admission: Dg Foot Complete Right  10/24/2014   CLINICAL DATA:  Cellulitis of the foot. Second toe fracture 2 weeks prior.  EXAM: RIGHT FOOT COMPLETE - 3+ VIEW  COMPARISON:  Radiographs 10/13/2014  FINDINGS: Avulsion fracture from the distal seconds tuft, unchanged in appearance. No acute fracture. 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 No frank osseous erosions or periosteal reaction. Plantar calcaneal spur and Achilles tendon enthesophyte again seen. There is progressive soft tissue edema about the foot. No radiographic findings of soft tissue air or foreign body.  IMPRESSION: 1. Avulsion fracture of the distal second toe, unchanged in alignment. 2. Increased soft tissue edema about the foot. No radiographic findings of osteomyelitis or soft tissue air.   Electronically Signed   By: Jeb Levering M.D.   On: 10/24/2014 01:04   Dg Foot Complete Right  10/13/2014   CLINICAL DATA:  Second toe right foot injury 2 weeks ago  EXAM: RIGHT FOOT COMPLETE - 3+ VIEW  COMPARISON:  None.  FINDINGS: Three views of the right foot submitted. There is avulsion fracture at the tip of distal phalanx second toe. There is soft tissue swelling and soft tissue irregularity second toe.  IMPRESSION: Avulsion fracture at the tip of distal phalanx second toe. Soft tissue swelling noted second toe.   Electronically Signed   By:  Lahoma Crocker M.D.   On: 10/13/2014 18:28   Old records reviewed Case discussed with ER PA Minze  Assessment/Plan  49 yo female diabetic with worsening left foot wound/cellulitis despite 10 days of clindamycin orally  Principal Problem:   Cellulitis of foot-  R/o osteo with MRI in am.  Broaden abx to vanco and zosyn iv.  Pulses intact.  High risk of needing amputation due to her multiple medical issues.  Will likely need to get ortho involved for outpatient follow up especially if  Mri abnormal.    Active Problems:   DM (diabetes mellitus) uncontrolled-  Cont home lantus, place on ssi,  ivf overnight, not dka.   Epilepsy-  Cont home medications   COPD (chronic obstructive pulmonary disease)-  Stable and compensated   Bipolar 1 disorder-  stable   OCD (obsessive compulsive disorder)   Hypertension- stable   Hyponatremia new- secondary to hyperglycemia over 400, correct glucose level.   Hypokalemia new- replete in ivf  Diuretic on hold, consider resuming in the next day or so.  Admit to medical floor.  FULL CODE.  DAVID,RACHAL A 10/24/2014, 2:02 AM

## 2014-10-24 NOTE — Anesthesia Preprocedure Evaluation (Signed)
Anesthesia Evaluation  Patient identified by MRN, date of birth, ID band Patient awake    Reviewed: Allergy & Precautions, H&P , NPO status , Patient's Chart, lab work & pertinent test results  Airway Mallampati: III  TM Distance: >3 FB Neck ROM: full    Dental no notable dental hx.    Pulmonary Current Smoker,    Pulmonary exam normal       Cardiovascular Exercise Tolerance: Good hypertension, Pt. on medications Normal cardiovascular exam    Neuro/Psych PSYCHIATRIC DISORDERS Anxiety Depression Bipolar Disorder    GI/Hepatic negative GI ROS, Neg liver ROS,   Endo/Other  diabetes  Renal/GU negative Renal ROS  negative genitourinary   Musculoskeletal   Abdominal (+) + obese,   Peds  Hematology negative hematology ROS (+)   Anesthesia Other Findings   Reproductive/Obstetrics negative OB ROS                             Anesthesia Physical Anesthesia Plan  ASA: III and emergent  Anesthesia Plan: General   Post-op Pain Management:    Induction: Intravenous  Airway Management Planned: LMA  Additional Equipment:   Intra-op Plan:   Post-operative Plan:   Informed Consent: I have reviewed the patients History and Physical, chart, labs and discussed the procedure including the risks, benefits and alternatives for the proposed anesthesia with the patient or authorized representative who has indicated his/her understanding and acceptance.     Plan Discussed with: CRNA and Surgeon  Anesthesia Plan Comments:         Anesthesia Quick Evaluation

## 2014-10-24 NOTE — Consult Note (Signed)
Mallory Potts is an 49 y.o. female.    Chief Complaint: right foot swelling and reddness  HPI: Pt is a 49 y.o. female complaining of right foot pain and swelling worse for the last week. Pain had continually increased since the beginning.  Pt has tried various conservative treatments which have failed to alleviate their symptoms, including 1 week of clindamycin and dressing changes. Various options are discussed with the patient. Risks, benefits and expectations were discussed with the patient. Patient understand the risks, benefits and expectations and wishes to proceed with surgery.  Pt will need 2nd toe amputation and IV antibiotics to prevent osteo from worsening.  PCP:  Enobong, Amao, MD  D/C Plans: Home  PMH: Past Medical History  Diagnosis Date  . Diabetes mellitus   . Asthma   . Arthritis   . Hypertension   . Epilepsy   . Seizures   . Thyroid disease   . COPD (chronic obstructive pulmonary disease)   . Bipolar 1 disorder   . Peripheral edema   . OCD (obsessive compulsive disorder)   . COPD (chronic obstructive pulmonary disease)   . Depression   . Anxiety     PSH: Past Surgical History  Procedure Laterality Date  . Tonsillectomy    . Knee arthroscopy      Social History:  reports that she has been smoking Cigarettes.  She has a 2.5 pack-year smoking history. She does not have any smokeless tobacco history on file. She reports that she does not drink alcohol or use illicit drugs.  Allergies:  Allergies  Allergen Reactions  . Lithium Shortness Of Breath  . Milk-Related Compounds Nausea And Vomiting  . Other     Bell peppers. Mouth sores.    . Abilify [Aripiprazole] Rash  . Aripiprazole Rash    Medications: Current Facility-Administered Medications  Medication Dose Route Frequency Provider Last Rate Last Dose  . 0.9 % NaCl with KCl 20 mEq/ L  infusion   Intravenous Continuous Ripudeep K Rai, MD 75 mL/hr at 10/24/14 0425    . acetaminophen (TYLENOL) tablet  1,000 mg  1,000 mg Oral Q6H PRN Rachal A David, MD      . albuterol (PROVENTIL) (2.5 MG/3ML) 0.083% nebulizer solution 2.5 mg  2.5 mg Nebulization q12n4p Rachal A David, MD   2.5 mg at 10/24/14 0843  . albuterol (PROVENTIL) (2.5 MG/3ML) 0.083% nebulizer solution 3 mL  3 mL Inhalation Q6H PRN Rachal A David, MD      . antiseptic oral rinse (CPC / CETYLPYRIDINIUM CHLORIDE 0.05%) solution 7 mL  7 mL Mouth Rinse BID Rachal A David, MD      . carbamazepine (TEGRETOL) tablet 200 mg  200 mg Oral Daily Rachal A David, MD      . carbamazepine (TEGRETOL) tablet 400 mg  400 mg Oral QHS Rachal A David, MD      . chlorthalidone (HYGROTON) tablet 25 mg  25 mg Oral Daily Rachal A David, MD      . diphenhydrAMINE (BENADRYL) capsule 25 mg  25 mg Oral Q8H PRN Rachal A David, MD      . enoxaparin (LOVENOX) injection 50 mg  50 mg Subcutaneous Daily Rachal A David, MD      . fluticasone (FLONASE) 50 MCG/ACT nasal spray 1 spray  1 spray Each Nare Daily Rachal A David, MD      . gabapentin (NEURONTIN) capsule 100 mg  100 mg Oral BID Rachal A David, MD      . HYDROmorphone (  DILAUDID) injection 1 mg  1 mg Intravenous Q4H PRN Phillips Grout, MD   1 mg at 10/24/14 1007  . insulin aspart (novoLOG) injection 0-15 Units  0-15 Units Subcutaneous TID WC Ripudeep K Rai, MD      . insulin aspart (novoLOG) injection 0-5 Units  0-5 Units Subcutaneous QHS Ripudeep K Rai, MD      . insulin glargine (LANTUS) injection 15 Units  15 Units Subcutaneous QHS Rachal A David, MD      . levothyroxine (SYNTHROID, LEVOTHROID) tablet 50 mcg  50 mcg Oral QAC breakfast Phillips Grout, MD      . meloxicam (MOBIC) tablet 15 mg  15 mg Oral Daily Rachal A Shanon Brow, MD      . pantoprazole (PROTONIX) EC tablet 40 mg  40 mg Oral Daily Phillips Grout, MD      . piperacillin-tazobactam (ZOSYN) IVPB 3.375 g  3.375 g Intravenous Q8H Dorrene German, RPH   3.375 g at 10/24/14 0849  . tiotropium (SPIRIVA) inhalation capsule 18 mcg  18 mcg Inhalation Daily Phillips Grout, MD   18 mcg at 10/24/14 0846  . traZODone (DESYREL) tablet 50 mg  50 mg Oral QHS Phillips Grout, MD      . vancomycin (VANCOCIN) 2,000 mg in sodium chloride 0.9 % 500 mL IVPB  2,000 mg Intravenous Once Dorrene German, [facility]      . vancomycin (VANCOCIN) IVPB 750 mg/150 ml premix  750 mg Intravenous Q12H Dorrene German, [facility]        Results for orders placed or performed during the hospital encounter of 10/23/14 (from the past 48 hour(s))  Comprehensive metabolic panel     Status: Abnormal   Collection Time: 10/23/14  8:40 PM  Result Value Ref Range   Sodium 122 (L) 135 - 145 mmol/L   Potassium 3.2 (L) 3.5 - 5.1 mmol/L   Chloride 88 (L) 101 - 111 mmol/L   CO2 24 22 - 32 mmol/L   Glucose, Bld 401 (H) 65 - 99 mg/dL   BUN 15 6 - 20 mg/dL   Creatinine, Ser 1.09 (H) 0.44 - 1.00 mg/dL   Calcium 7.9 (L) 8.9 - 10.3 mg/dL   Total Protein 8.3 (H) 6.5 - 8.1 g/dL   Albumin 2.4 (L) 3.5 - 5.0 g/dL   AST 19 15 - 41 U/L   ALT 16 14 - 54 U/L   Alkaline Phosphatase 63 38 - 126 U/L   Total Bilirubin 0.2 (L) 0.3 - 1.2 mg/dL   GFR calc non Af Amer 59 (L) >60 mL/min   GFR calc Af Amer >60 >60 mL/min    Comment: (NOTE) The eGFR has been calculated using the CKD EPI equation. This calculation has not been validated in all clinical situations. eGFR's persistently <60 mL/min signify possible Chronic Kidney Disease.    Anion gap 10 5 - 15  CBC with Differential     Status: Abnormal   Collection Time: 10/23/14  8:40 PM  Result Value Ref Range   WBC 20.0 (H) 4.0 - 10.5 K/uL   RBC 3.01 (L) 3.87 - 5.11 MIL/uL   Hemoglobin 9.2 (L) 12.0 - 15.0 g/dL   HCT 27.6 (L) 36.0 - 46.0 %   MCV 91.7 78.0 - 100.0 fL   MCH 30.6 26.0 - 34.0 pg   MCHC 33.3 30.0 - 36.0 g/dL   RDW 14.0 11.5 - 15.5 %   Platelets 270 150 - 400 K/uL   Neutrophils Relative %  89 (H) 43 - 77 %   Neutro Abs 17.7 (H) 1.7 - 7.7 K/uL   Lymphocytes Relative 6 (L) 12 - 46 %   Lymphs Abs 1.2 0.7 - 4.0 K/uL   Monocytes Relative 5 3 - 12 %     Monocytes Absolute 1.0 0.1 - 1.0 K/uL   Eosinophils Relative 0 0 - 5 %   Eosinophils Absolute 0.0 0.0 - 0.7 K/uL   Basophils Relative 0 0 - 1 %   Basophils Absolute 0.0 0.0 - 0.1 K/uL  I-Stat CG4 Lactic Acid, ED (Not at [facility] or [facility])     Status: None   Collection Time: 10/23/14  8:54 PM  Result Value Ref Range   Lactic Acid, Venous 0.90 0.5 - 2.0 mmol/L  Sedimentation rate     Status: Abnormal   Collection Time: 10/24/14 12:06 AM  Result Value Ref Range   Sed Rate 150 (H) 0 - 22 mm/hr  I-Stat CG4 Lactic Acid, ED (Not at [facility] or [facility])     Status: None   Collection Time: 10/24/14 12:12 AM  Result Value Ref Range   Lactic Acid, Venous 0.99 0.5 - 2.0 mmol/L  Urinalysis, Routine w reflex microscopic     Status: Abnormal   Collection Time: 10/24/14  1:08 AM  Result Value Ref Range   Color, Urine YELLOW YELLOW   APPearance CLEAR CLEAR   Specific Gravity, Urine 1.013 1.005 - 1.030   pH 6.0 5.0 - 8.0   Glucose, UA 250 (A) NEGATIVE mg/dL   Hgb urine dipstick SMALL (A) NEGATIVE   Bilirubin Urine NEGATIVE NEGATIVE   Ketones, ur NEGATIVE NEGATIVE mg/dL   Protein, ur 100 (A) NEGATIVE mg/dL   Urobilinogen, UA 0.2 0.0 - 1.0 mg/dL   Nitrite NEGATIVE NEGATIVE   Leukocytes, UA NEGATIVE NEGATIVE  Urine microscopic-add on     Status: None   Collection Time: 10/24/14  1:08 AM  Result Value Ref Range   WBC, UA 0-2 <3 WBC/hpf   RBC / HPF 0-2 <3 RBC/hpf   Urine-Other AMORPHOUS URATES/PHOSPHATES   CBC     Status: Abnormal   Collection Time: 10/24/14  5:01 AM  Result Value Ref Range   WBC 15.9 (H) 4.0 - 10.5 K/uL   RBC 2.74 (L) 3.87 - 5.11 MIL/uL   Hemoglobin 8.1 (L) 12.0 - 15.0 g/dL   HCT 24.6 (L) 36.0 - 46.0 %   MCV 89.8 78.0 - 100.0 fL   MCH 29.6 26.0 - 34.0 pg   MCHC 32.9 30.0 - 36.0 g/dL   RDW 13.9 11.5 - 15.5 %   Platelets 242 150 - 400 K/uL  Basic metabolic panel     Status: Abnormal   Collection Time: 10/24/14  5:01 AM  Result Value Ref Range   Sodium 126 (L) 135 - 145 mmol/L    Potassium 3.1 (L) 3.5 - 5.1 mmol/L   Chloride 88 (L) 101 - 111 mmol/L   CO2 28 22 - 32 mmol/L   Glucose, Bld 362 (H) 65 - 99 mg/dL   BUN 14 6 - 20 mg/dL   Creatinine, Ser 0.95 0.44 - 1.00 mg/dL   Calcium 7.9 (L) 8.9 - 10.3 mg/dL   GFR calc non Af Amer >60 >60 mL/min   GFR calc Af Amer >60 >60 mL/min    Comment: (NOTE) The eGFR has been calculated using the CKD EPI equation. This calculation has not been validated in all clinical situations. eGFR's persistently <60 mL/min signify possible Chronic Kidney Disease.  Anion gap 10 5 - 15  Glucose, capillary     Status: Abnormal   Collection Time: 10/24/14  5:54 AM  Result Value Ref Range   Glucose-Capillary 358 (H) 65 - 99 mg/dL   Comment 1 Notify RN    Comment 2 Document in Chart   Glucose, capillary     Status: Abnormal   Collection Time: 10/24/14  7:30 AM  Result Value Ref Range   Glucose-Capillary 341 (H) 65 - 99 mg/dL   Comment 1 Notify RN    Comment 2 Document in Chart    Dg Foot Complete Right  10/24/2014   CLINICAL DATA:  Cellulitis of the foot. Second toe fracture 2 weeks prior.  EXAM: RIGHT FOOT COMPLETE - 3+ VIEW  COMPARISON:  Radiographs 10/13/2014  FINDINGS: Avulsion fracture from the distal seconds tuft, unchanged in appearance. No acute fracture. 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 No frank osseous erosions or periosteal reaction. Plantar calcaneal spur and Achilles tendon enthesophyte again seen. There is progressive soft tissue edema about the foot. No radiographic findings of soft tissue air or foreign body.  IMPRESSION: 1. Avulsion fracture of the distal second toe, unchanged in alignment. 2. Increased soft tissue edema about the foot. No radiographic findings of osteomyelitis or soft tissue air.   Electronically Signed   By: Jeb Levering M.D.   On: 10/24/2014 01:04    ROS: Pain with rom of the right lower extremity  Physical Exam:  Alert and oriented 49 y.o. female in no acute  distress Cranial nerves 2-12 intact Cervical spine: full rom with no tenderness, nv intact distally Chest: active breath sounds bilaterally, no wheeze rhonchi or rales Heart: regular rate and rhythm, no murmur Abd: non tender non distended with active bowel sounds Hip is stable with rom  Right foot with obvious smell of infection and probable osteo 2nd toe with moderate erythema, no sensation and necrosis to end of the toe Erythema extends to mid foot with moderate maceration and some drainage from web spaces of toes 3-5 nv intact to other toes Currently great to is unaffected  Assessment/Plan Assessment: 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 Veverly Fells later today. Risks benefits and expectations were discussed with the patient. Patient understand risks, benefits and expectations and wishes to proceed. Keep npo  Continue IV antibiotics  New dressing daily

## 2014-10-24 NOTE — Interval H&P Note (Signed)
History and Physical Interval Note:  10/24/2014 2:06 PM  Mallory Potts  has presented today for surgery, with the diagnosis of right second toe gangrene  The various methods of treatment have been discussed with the patient and family. After consideration of risks, benefits and other options for treatment, the patient has consented to  Procedure(s): AMPUTATION RAY RIGHT SECOND TOE (Right) as a surgical intervention .  The patient's history has been reviewed, patient examined, no change in status, stable for surgery.  I have reviewed the patient's chart and labs.  Questions were answered to the patient's satisfaction.     Mallory Potts,STEVEN R

## 2014-10-24 NOTE — Op Note (Signed)
NAMEROZELYN, MACHNIK                  ACCOUNT NO.:  [id number]  MEDICAL RECORD NO.:  [id number]  LOCATION:  [id number]                         FACILITY:  [facility]  PHYSICIAN:  Doran Heater. Veverly Fells, M.D. DATE OF BIRTH:  1966/04/14  DATE OF PROCEDURE:  10/24/2014 DATE OF DISCHARGE:                              OPERATIVE REPORT   PREOPERATIVE DIAGNOSES:  Right second toe gangrene and forefoot infection.  POSTOPERATIVE DIAGNOSES: 1. Right second tapering drain. 2. Severe forefoot infection. 3. Severe midfoot infection.  PROCEDURE PERFORMED:  Right second toe amputation with forefoot and midfoot I and D with I and D of deep plantar space and also the dorsal foot.  ATTENDING SURGEON:  Doran Heater. Veverly Fells, MD.  ASSISTANT:  None.  ANESTHESIA:  LMA general anesthesia was used.  ESTIMATED BLOOD LOSS:  Minimal.  FLUID REPLACEMENT:  1200 mL crystalloid.  INSTRUMENT COUNTS:  Correct.  COMPLICATIONS:  There were no complications.  ANTIBIOTICS:  Perioperative antibiotics were given.  INDICATIONS:  The patient is a 49 year old female with diabetes who presents with second toe gangrene and severe forefoot infection. Despite IV antibiotics, the patient's foot erythema has progressed.  She has frank pus draining from multiple areas of her foot.  I counseled the patient regarding the need to perform urgent I and D surgery including removal of second toe as well as I and D in the forefoot and midfoot. The patient understands and agrees.  I think it is highly likely the patient will require a below-knee amputation in the next week or so.  We will need extensive counseling of the patient regarding this and will try to get a hold off diabetic foot specialist for decision making. Risks and benefits were discussed with the patient for the currently planned damage control surgery and informed consent was obtained.  DESCRIPTION OF PROCEDURE:  After an adequate level of anesthesia was achieved, the patient was  positioned supine on the operating room table. The right leg was sterilely prepped and draped in usual manner.  Time- out was called.  We went ahead and entered the foot through 2 dorsal longitudinal incisions centered over the second metatarsal and the fourth metatarsal.  Dissection was carried down in tennis racquet incision around the plantar aspect of the second toe at the MTP joint, and we disarticulated the toe at the MTP joint.  Extensive purulence was uncovered.  Cultures were sent for aerobic, anaerobic, and Gram stain routine.  The toe was sent for pathology in formalin.  We then did an extensive decompression of the large amount of pus in the dorsal forefoot and midfoot as well as the plantar midfoot.  Second plantar incision was made longitudinally centered over the area where I could palpate fluctuance and pus.  We were able to use a pulse irrigator, 6 L of normal saline irrigation and sharp debridement of all nonviable tissue.  Other toes were not looking great, specifically the fourth toe, but there was still capillary refill present.  We felt that this would be damage control surgery.  We went to make sure there was no more pus remaining in the foot which was not.  We then went ahead and tacked.  We did a 1 sponge, so we used diluted Betadine soaked sponges, 1 through the intermetatarsal space from the plantar aspect of the foot, dorsal aspect of the foot, remainder just packing the dead space.  We then applied a dry compressive bandage around that and an ace wrap.  The patient's foot was elevated.  She was transported to the recovery room in stable condition.     Doran Heater. Veverly Fells, M.D.     SRN/MEDQ  D:  10/24/2014  T:  10/24/2014  Job:  [id number]

## 2014-10-24 NOTE — Progress Notes (Signed)
Patient arrived on the unit at approximately 0252. She is alert and verbally responsive and in no acute distress. Right foot swollen ,red and with moderate amount of serosanguinous and foul smelling drainage. Skin is peeling to plantar and dorsal foot with toes 3-5 very swollen and weeping. Noted yellow pocket extending to ball of right foot. Patient stated she has neuropathy to her feet and is not hurting at this time. The inner right 2nd toe has an open area with 100% slough and the tip has black eschar. CBG was 358 this morning and 9 units of Novolog was given. Patient scheduled for MRI of foot without contrast for today. Right foot cleaned and dry dressing applied, Will continue to monitor wound for changes.

## 2014-10-25 ENCOUNTER — Encounter (HOSPITAL_COMMUNITY)
Admission: EM | Disposition: A | Discharge status: Skilled Nursing Facility | Payer: Self-pay | Source: Home / Self Care | Attending: Internal Medicine

## 2014-10-25 DIAGNOSIS — E119 Type 2 diabetes mellitus without complications: Secondary | ICD-10-CM | POA: Diagnosis not present

## 2014-10-25 DIAGNOSIS — G40802 Other epilepsy, not intractable, without status epilepticus: Secondary | ICD-10-CM

## 2014-10-25 DIAGNOSIS — J449 Chronic obstructive pulmonary disease, unspecified: Secondary | ICD-10-CM | POA: Diagnosis not present

## 2014-10-25 DIAGNOSIS — L03119 Cellulitis of unspecified part of limb: Secondary | ICD-10-CM | POA: Diagnosis not present

## 2014-10-25 DIAGNOSIS — F319 Bipolar disorder, unspecified: Secondary | ICD-10-CM | POA: Diagnosis not present

## 2014-10-25 LAB — ABO/RH: ABO/RH(D): A POS

## 2014-10-25 LAB — BASIC METABOLIC PANEL
ANION GAP: 9 (ref 5–15)
BUN: 13 mg/dL (ref 6–20)
CO2: 26 mmol/L (ref 22–32)
CREATININE: 1.11 mg/dL — AB (ref 0.44–1.00)
Calcium: 7.7 mg/dL — ABNORMAL LOW (ref 8.9–10.3)
Chloride: 94 mmol/L — ABNORMAL LOW (ref 101–111)
GFR calc non Af Amer: 58 mL/min — ABNORMAL LOW (ref >60)
Glucose, Bld: 287 mg/dL — ABNORMAL HIGH (ref 65–99)
POTASSIUM: 3.8 mmol/L (ref 3.5–5.1)
Sodium: 129 mmol/L — ABNORMAL LOW (ref 135–145)

## 2014-10-25 LAB — CBC WITH DIFFERENTIAL/PLATELET
BASOS ABS: 0 10*3/uL (ref 0.0–0.1)
BASOS PCT: 0 % (ref 0–1)
EOS PCT: 1 % (ref 0–5)
Eosinophils Absolute: 0.1 10*3/uL (ref 0.0–0.7)
HCT: 26.5 % — ABNORMAL LOW (ref 36.0–46.0)
HEMOGLOBIN: 8.9 g/dL — AB (ref 12.0–15.0)
Lymphocytes Relative: 10 % — ABNORMAL LOW (ref 12–46)
Lymphs Abs: 1.2 10*3/uL (ref 0.7–4.0)
MCH: 30 pg (ref 26.0–34.0)
MCHC: 33.6 g/dL (ref 30.0–36.0)
MCV: 89.2 fL (ref 78.0–100.0)
MONO ABS: 0.7 10*3/uL (ref 0.1–1.0)
MONOS PCT: 6 % (ref 3–12)
NEUTROS ABS: 10.5 10*3/uL — AB (ref 1.7–7.7)
Neutrophils Relative %: 83 % — ABNORMAL HIGH (ref 43–77)
Platelets: 217 10*3/uL (ref 150–400)
RBC: 2.97 MIL/uL — ABNORMAL LOW (ref 3.87–5.11)
RDW: 14.3 % (ref 11.5–15.5)
WBC: 12.5 10*3/uL — ABNORMAL HIGH (ref 4.0–10.5)

## 2014-10-25 LAB — PREPARE RBC (CROSSMATCH)

## 2014-10-25 LAB — OSMOLALITY: OSMOLALITY: 277 mosm/kg (ref 275–300)

## 2014-10-25 LAB — GLUCOSE, CAPILLARY
Glucose-Capillary: 315 mg/dL — ABNORMAL HIGH (ref 65–99)
Glucose-Capillary: 263 mg/dL — ABNORMAL HIGH (ref 65–99)
Glucose-Capillary: 231 mg/dL — ABNORMAL HIGH (ref 65–99)
Glucose-Capillary: 265 mg/dL — ABNORMAL HIGH (ref 65–99)

## 2014-10-25 LAB — OSMOLALITY, URINE: Osmolality, Ur: 303 mOsm/kg — ABNORMAL LOW (ref 390–1090)

## 2014-10-25 SURGERY — AMPUTATION BELOW KNEE
Anesthesia: General | Laterality: Right

## 2014-10-25 MED ORDER — CHLORHEXIDINE GLUCONATE CLOTH 2 % EX PADS
1.0000 | MEDICATED_PAD | Freq: Once | CUTANEOUS | Status: AC
  Administered 2014-10-25: 1 via TOPICAL

## 2014-10-25 NOTE — Progress Notes (Signed)
[facility]                                                                              Patient Demographics  Mallory Potts, is a 49 y.o. female, DOB - [birth year], [id number]  Admit date - 10/23/2014   Admitting Physician Phillips Grout, MD  Outpatient Primary MD for the patient is Arnoldo Morale, MD  LOS - 1   Chief Complaint  Patient presents with  . Cellulitis in foot        Brief HPI   Patient is a 49 year old female with insulin-dependent diabetes mellitus, COPD, hypertension presented with right foot pain and swelling despite outpatient antibiotics. Patient noted that approximately 2 weeks ago she had hit her second toe and broke the distal portion of the toe. Since then she's been having pain and swelling which has been progressively worsening. 10 days ago she noticed swelling which has now progressed up to her foot. She was seen in ED on 5/24. She was diagnosed with cellulitis and placed on oral clindamycin. Patient reports that over the last 4 days prior to admission, she noticed worsening of the pain and swelling and hence she presented to the ER for further workup.   Assessment & Plan    Principal Problem:   Cellulitis of right foot with right second toe gangrene, osteomyelitis of right second toe - Continue IV vancomycin and Zosyn - Appreciate orthopedic recommendations, postop day #1 with a ray amputation of the right second toe - Per ortho, keep NPO, plan for BKA, today  Active Problems:   DM (diabetes mellitus) - Placed on Levemir, sliding scale insulin, currently NPO again -  hold all oral hypoglycemics    Epilepsy Continue Tegretol    COPD (chronic obstructive pulmonary disease) - Currently stable, continue albuterol nebs as needed, Spiriva    Bipolar 1 disorder - Continue trazodone  Hypothyroidism  - Continue Synthroid   Hypokalemia  - Continue replacement   Obesity - Counseled patient on diet and weight control  Code Status:  Full code   Family Communication: Discussed in detail with the patient, all imaging results, lab results explained to the patient and sister at the bedside  Disposition Plan:   Time Spent in minutes   25 minutes  Procedures  Mri rt foot  Consults   ortho  DVT Prophylaxis  scds  Medications  Scheduled Meds: . sodium chloride   Intravenous Once  . antiseptic oral rinse  7 mL Mouth Rinse BID  . carbamazepine  200 mg Oral Daily  . carbamazepine  400 mg Oral QHS  . chlorthalidone  25 mg Oral Daily  . enoxaparin (LOVENOX) injection  30 mg Subcutaneous Q24H  . fluticasone  1 spray Each Nare Daily  . gabapentin  100 mg Oral BID  . insulin aspart  0-15 Units Subcutaneous TID WC  . insulin aspart  0-5 Units Subcutaneous QHS  . insulin glargine  15 Units Subcutaneous QHS  . levothyroxine  50 mcg Oral QAC breakfast  . meloxicam  15 mg Oral Daily  . pantoprazole  40 mg Oral Daily  . piperacillin-tazobactam (ZOSYN)  IV  3.375  g Intravenous Q8H  . sodium chloride  10-40 mL Intracatheter Q12H  . tiotropium  18 mcg Inhalation Daily  . traZODone  50 mg Oral QHS  . vancomycin  750 mg Intravenous Q12H   Continuous Infusions: . sodium chloride 10 mL/hr at 10/24/14 1748   PRN Meds:.acetaminophen **OR** acetaminophen, acetaminophen, albuterol, diphenhydrAMINE, HYDROcodone-acetaminophen, HYDROmorphone (DILAUDID) injection, metoCLOPramide **OR** metoCLOPramide (REGLAN) injection, morphine injection, ondansetron **OR** ondansetron (ZOFRAN) IV, oxyCODONE, sodium chloride   Antibiotics   Anti-infectives    Start     Dose/Rate Route Frequency Ordered Stop   10/24/14 1800  vancomycin (VANCOCIN) IVPB 750 mg/150 ml premix     750 mg 150 mL/hr over 60 Minutes Intravenous Every 12 hours 10/24/14 0410     10/24/14 1000  clindamycin (CLEOCIN) capsule 450 mg  Status:  Discontinued     450 mg Oral 3 times daily 10/24/14 0346 10/24/14 0401   10/24/14 0800  piperacillin-tazobactam (ZOSYN) IVPB 3.375 g      3.375 g 12.5 mL/hr over 240 Minutes Intravenous Every 8 hours 10/24/14 0356     10/24/14 0400  vancomycin (VANCOCIN) 2,000 mg in sodium chloride 0.9 % 500 mL IVPB  Status:  Discontinued     2,000 mg 250 mL/hr over 120 Minutes Intravenous  Once 10/24/14 0355 10/24/14 1300   10/24/14 0130  vancomycin (VANCOCIN) 2,000 mg in sodium chloride 0.9 % 500 mL IVPB  Status:  Discontinued     2,000 mg 250 mL/hr over 120 Minutes Intravenous  Once 10/24/14 0121 10/24/14 0625   10/24/14 0015  piperacillin-tazobactam (ZOSYN) IVPB 3.375 g     3.375 g 12.5 mL/hr over 240 Minutes Intravenous  Once 10/24/14 0008 10/24/14 0217        Subjective:   Mallory Potts was seen and examined today. Pain currently controlled, sister at the bedside, NPO for right BKA today, dressing intact on the right foot   Patient denies dizziness, chest pain, shortness of breath, abdominal pain, N/V/D/C, new weakness, numbess, tingling. No acute events overnight.    Objective:   Blood pressure 131/67, pulse 79, temperature 98.2 F (36.8 C), temperature source Oral, resp. rate 18, height 5\' 5"  (1.651 m), weight 104.327 kg (230 lb), last menstrual period 10/09/2014, SpO2 99 %.  Wt Readings from Last 3 Encounters:  10/24/14 104.327 kg (230 lb)  02/25/13 107.684 kg (237 lb 6.4 oz)  07/25/11 108.863 kg (240 lb)     Intake/Output Summary (Last 24 hours) at 10/25/14 1150 Last data filed at 10/25/14 0900  Gross per 24 hour  Intake 2872.1 ml  Output      0 ml  Net 2872.1 ml    Exam  General: Alert and oriented x 3, NAD  HEENT:  PERRLA, EOMI, A  Neck: Supple, no JVD, no masses  CVS: S1 S2 clear, RRR  Respiratory: CTAB  Abdomen: obese, Soft, NT, ND, NBS  Ext: no cyanosis clubbing or edema  Neuro: no new neuro deficits  Skin: Dressing intact on the right foot  Psych: Anxious and oriented   Data Review   Micro Results Recent Results (from the past 240 hour(s))  Culture, blood (routine x 2)     Status: None  (Preliminary result)   Collection Time: 10/24/14  9:07 AM  Result Value Ref Range Status   Specimen Description BLOOD RIGHT ARM  Final   Special Requests BOTTLES DRAWN AEROBIC ONLY 10CC AEROBIC ONLY   Final   Culture   Final  BLOOD CULTURE RECEIVED NO GROWTH TO DATE CULTURE WILL BE HELD FOR 5 DAYS BEFORE ISSUING A FINAL NEGATIVE REPORT Performed at [facility]    Report Status PENDING  Incomplete  Culture, blood (routine x 2)     Status: None (Preliminary result)   Collection Time: 10/24/14  9:12 AM  Result Value Ref Range Status   Specimen Description BLOOD RIGHT HAND  Final   Special Requests BOTTLES DRAWN AEROBIC ONLY 5CC AEROBIC ONLY   Final   Culture   Final           BLOOD CULTURE RECEIVED NO GROWTH TO DATE CULTURE WILL BE HELD FOR 5 DAYS BEFORE ISSUING A FINAL NEGATIVE REPORT Performed at [facility]    Report Status PENDING  Incomplete  Surgical pcr screen     Status: None   Collection Time: 10/24/14 10:21 AM  Result Value Ref Range Status   MRSA, PCR NEGATIVE NEGATIVE Final   Staphylococcus aureus NEGATIVE NEGATIVE Final    Comment:        The Xpert SA Assay (FDA approved for NASAL specimens in patients over 64 years of age), is one component of a comprehensive surveillance program.  Test performance has been validated by [facility] for patients greater than or equal to 29 year old. It is not intended to diagnose infection nor to guide or monitor treatment.   Wound culture     Status: None (Preliminary result)   Collection Time: 10/24/14 10:27 AM  Result Value Ref Range Status   Specimen Description ABSCESS  Final   Special Requests NONE  Final   Gram Stain PENDING  Incomplete   Culture   Final    Culture reincubated for better growth Performed at [facility]    Report Status PENDING  Incomplete  Anaerobic culture     Status: None (Preliminary result)   Collection Time: 10/24/14  1:45 PM  Result Value Ref Range Status    Specimen Description TOE RIGHT ABSCESS  Final   Special Requests NONE  Final   Gram Stain PENDING  Incomplete   Culture   Final    NO ANAEROBES ISOLATED; CULTURE IN PROGRESS FOR 5 DAYS Performed at [facility]    Report Status PENDING  Incomplete  Culture, routine-abscess     Status: None (Preliminary result)   Collection Time: 10/24/14  1:45 PM  Result Value Ref Range Status   Specimen Description TOE RIGHT ABSCESS  Final   Special Requests NONE  Final   Gram Stain PENDING  Incomplete   Culture   Final    Culture reincubated for better growth Performed at [facility]    Report Status PENDING  Incomplete    Radiology Reports Dg Foot Complete Right  10/24/2014   CLINICAL DATA:  Cellulitis of the foot. Second toe fracture 2 weeks prior.  EXAM: RIGHT FOOT COMPLETE - 3+ VIEW  COMPARISON:  Radiographs 10/13/2014  FINDINGS: Avulsion fracture from the distal seconds tuft, unchanged in appearance. No acute fracture. 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 No frank osseous erosions or periosteal reaction. Plantar calcaneal spur and Achilles tendon enthesophyte again seen. There is progressive soft tissue edema about the foot. No radiographic findings of soft tissue air or foreign body.  IMPRESSION: 1. Avulsion fracture of the distal second toe, unchanged in alignment. 2. Increased soft tissue edema about the foot. No radiographic findings of osteomyelitis or soft tissue air.   Electronically Signed   By: Threasa Beards  Ehinger M.D.   On: 10/24/2014 01:04   Dg Foot Complete Right  10/13/2014   CLINICAL DATA:  Second toe right foot injury 2 weeks ago  EXAM: RIGHT FOOT COMPLETE - 3+ VIEW  COMPARISON:  None.  FINDINGS: Three views of the right foot submitted. There is avulsion fracture at the tip of distal phalanx second toe. There is soft tissue swelling and soft tissue irregularity second toe.  IMPRESSION: Avulsion fracture at the tip of distal phalanx second toe. Soft tissue  swelling noted second toe.   Electronically Signed   By: Lahoma Crocker M.D.   On: 10/13/2014 18:28    CBC  Recent Labs Lab 10/23/14 2040 10/24/14 0501 10/25/14 0918  WBC 20.0* 15.9* 12.5*  HGB 9.2* 8.1* 8.9*  HCT 27.6* 24.6* 26.5*  PLT 270 242 217  MCV 91.7 89.8 89.2  MCH 30.6 29.6 30.0  MCHC 33.3 32.9 33.6  RDW 14.0 13.9 14.3  LYMPHSABS 1.2  --  1.2  MONOABS 1.0  --  0.7  EOSABS 0.0  --  0.1  BASOSABS 0.0  --  0.0    Chemistries   Recent Labs Lab 10/23/14 2040 10/24/14 0501 10/25/14 0302  NA 122* 126* 129*  K 3.2* 3.1* 3.8  CL 88* 88* 94*  CO2 24 28 26   GLUCOSE 401* 362* 287*  BUN 15 14 13   CREATININE 1.09* 0.95 1.11*  CALCIUM 7.9* 7.9* 7.7*  AST 19  --   --   ALT 16  --   --   ALKPHOS 63  --   --   BILITOT 0.2*  --   --    ------------------------------------------------------------------------------------------------------------------ estimated creatinine clearance is 74.3 mL/min (by C-G formula based on Cr of 1.11). ------------------------------------------------------------------------------------------------------------------ No results for input(s): HGBA1C in the last 72 hours. ------------------------------------------------------------------------------------------------------------------ No results for input(s): CHOL, HDL, LDLCALC, TRIG, CHOLHDL, LDLDIRECT in the last 72 hours. ------------------------------------------------------------------------------------------------------------------ No results for input(s): TSH, T4TOTAL, T3FREE, THYROIDAB in the last 72 hours.  Invalid input(s): FREET3 ------------------------------------------------------------------------------------------------------------------ No results for input(s): VITAMINB12, FOLATE, FERRITIN, TIBC, IRON, RETICCTPCT in the last 72 hours.  Coagulation profile No results for input(s): INR, PROTIME in the last 168 hours.  No results for input(s): DDIMER in the last 72 hours.  Cardiac  Enzymes No results for input(s): CKMB, TROPONINI, MYOGLOBIN in the last 168 hours.  Invalid input(s): CK ------------------------------------------------------------------------------------------------------------------ Invalid input(s): Bond  10/24/14 1139 10/24/14 1400 10/24/14 1511 10/24/14 1630 10/24/14 2115 10/25/14 0728  GLUCAP 322* 327* 244* 211* 200* 315*     RAI,RIPUDEEP M.D. [facility] 10/25/2014, 11:50 AM  Pager: [phone]   Between 7am to 7pm - call Pager - [phone]  After 7pm go to [email or web address] - password [email or web address]  Call night coverage person covering after 7pm

## 2014-10-26 ENCOUNTER — Encounter (HOSPITAL_COMMUNITY): Payer: Self-pay | Admitting: Orthopedic Surgery

## 2014-10-26 DIAGNOSIS — L03119 Cellulitis of unspecified part of limb: Secondary | ICD-10-CM | POA: Diagnosis not present

## 2014-10-26 DIAGNOSIS — E119 Type 2 diabetes mellitus without complications: Secondary | ICD-10-CM | POA: Diagnosis not present

## 2014-10-26 DIAGNOSIS — J449 Chronic obstructive pulmonary disease, unspecified: Secondary | ICD-10-CM | POA: Diagnosis not present

## 2014-10-26 DIAGNOSIS — F319 Bipolar disorder, unspecified: Secondary | ICD-10-CM | POA: Diagnosis not present

## 2014-10-26 LAB — BASIC METABOLIC PANEL
Anion gap: 9 (ref 5–15)
BUN: 14 mg/dL (ref 6–20)
CO2: 28 mmol/L (ref 22–32)
Calcium: 8.2 mg/dL — ABNORMAL LOW (ref 8.9–10.3)
Chloride: 96 mmol/L — ABNORMAL LOW (ref 101–111)
Creatinine, Ser: 0.94 mg/dL (ref 0.44–1.00)
GFR calc non Af Amer: >60 mL/min (ref >60)
Glucose, Bld: 239 mg/dL — ABNORMAL HIGH (ref 65–99)
POTASSIUM: 3.4 mmol/L — AB (ref 3.5–5.1)
Sodium: 133 mmol/L — ABNORMAL LOW (ref 135–145)

## 2014-10-26 LAB — CBC
HCT: 27.9 % — ABNORMAL LOW (ref 36.0–46.0)
HEMOGLOBIN: 9.3 g/dL — AB (ref 12.0–15.0)
MCH: 30 pg (ref 26.0–34.0)
MCHC: 33.3 g/dL (ref 30.0–36.0)
MCV: 90 fL (ref 78.0–100.0)
Platelets: 230 10*3/uL (ref 150–400)
RBC: 3.1 MIL/uL — ABNORMAL LOW (ref 3.87–5.11)
RDW: 14.7 % (ref 11.5–15.5)
WBC: 11.2 10*3/uL — AB (ref 4.0–10.5)

## 2014-10-26 LAB — GLUCOSE, CAPILLARY
GLUCOSE-CAPILLARY: 219 mg/dL — AB (ref 65–99)
Glucose-Capillary: 290 mg/dL — ABNORMAL HIGH (ref 65–99)
Glucose-Capillary: 189 mg/dL — ABNORMAL HIGH (ref 65–99)
Glucose-Capillary: 236 mg/dL — ABNORMAL HIGH (ref 65–99)

## 2014-10-26 LAB — HEMOGLOBIN A1C
HEMOGLOBIN A1C: 11.1 % — AB (ref 4.8–5.6)
Mean Plasma Glucose: 272 mg/dL

## 2014-10-26 MED ORDER — INSULIN GLARGINE 100 UNIT/ML ~~LOC~~ SOLN
20.0000 [IU] | Freq: Every day | SUBCUTANEOUS | Status: DC
  Administered 2014-10-27: 20 [IU] via SUBCUTANEOUS
  Filled 2014-10-26 (×3): qty 0.2

## 2014-10-26 MED ORDER — INSULIN ASPART 100 UNIT/ML ~~LOC~~ SOLN
4.0000 [IU] | Freq: Three times a day (TID) | SUBCUTANEOUS | Status: DC
Start: 2014-10-26 — End: 2014-10-30
  Administered 2014-10-26 – 2014-10-30 (×9): 4 [IU] via SUBCUTANEOUS

## 2014-10-26 MED ORDER — INSULIN ASPART 100 UNIT/ML ~~LOC~~ SOLN: 0.0000 [IU] | Freq: Every day | SUBCUTANEOUS | Status: DC

## 2014-10-26 MED ORDER — INSULIN ASPART 100 UNIT/ML ~~LOC~~ SOLN
0.0000 [IU] | Freq: Three times a day (TID) | SUBCUTANEOUS | Status: DC
  Administered 2014-10-26 – 2014-10-27 (×2): 4 [IU] via SUBCUTANEOUS
  Administered 2014-10-27: 7 [IU] via SUBCUTANEOUS
  Administered 2014-10-28 (×2): 4 [IU] via SUBCUTANEOUS
  Administered 2014-10-28: 7 [IU] via SUBCUTANEOUS
  Administered 2014-10-29: 3 [IU] via SUBCUTANEOUS
  Administered 2014-10-29 (×2): 4 [IU] via SUBCUTANEOUS
  Administered 2014-10-30 (×2): 3 [IU] via SUBCUTANEOUS

## 2014-10-26 NOTE — Consult Note (Signed)
Reason for Consult: Recent R 2nd toe and forefoot I & D and concern for spreading infection. Referring Physician: Dr. Cleotis Lema is an 49 y.o. female.   HPI: Ms. Shimizu is a 49 y/o female with PMH as below that states that she had a R 2nd toe fx in the middle of May.  A week later she stubbed her toe and that resulted in an open wound.  States that she tried to tx it herself without positive results.  She was then seen 5/24 in the ED & treated for cellulitis/R toe ulcer/closed 2nd toe fx with a CAM boot and prescribed Clindamycin.  Friday 10/23/14 she was subsequently admitted to the hospital, diagnosed with cellulitis, and had her R 2nd toe amputated and a forefoot I&D on 10/24/14 by Dr. Veverly Fells.  Since that time she has been on the medical floor in a dry packed dressing and prescribed Vanc and Zosyn.  She endorses that she does have sensation in her foot and toes, with intermittent pain/discomfort.    Pre-Op Surgical Considerations:  Uncontrolled DM (A1c in the 9's), current smoker, COPD without acute exacerbation, controlled seizure disorder on tegratol, HTN  Past Medical History  Diagnosis Date  . Diabetes mellitus   . Asthma   . Arthritis   . Hypertension   . Epilepsy   . Seizures   . Thyroid disease   . COPD (chronic obstructive pulmonary disease)   . Bipolar 1 disorder   . Peripheral edema   . OCD (obsessive compulsive disorder)   . COPD (chronic obstructive pulmonary disease)   . Depression   . Anxiety     Past Surgical History  Procedure Laterality Date  . Tonsillectomy    . Knee arthroscopy      Family History  Problem Relation Age of Onset  . Asthma Other   . Diabetes Other   . Hypertension Other     Social History:  reports that she has been smoking Cigarettes.  She has a 2.5 pack-year smoking history. She does not have any smokeless tobacco history on file. She reports that she does not drink alcohol or use illicit drugs.  Allergies:  Allergies  Allergen  Reactions  . Lithium Shortness Of Breath  . Milk-Related Compounds Nausea And Vomiting  . Other     Bell peppers. Mouth sores.    . Abilify [Aripiprazole] Rash  . Aripiprazole Rash    Medications: I have reviewed the patient's current medications.  Results for orders placed or performed during the hospital encounter of 10/23/14 (from the past 48 hour(s))  Glucose, capillary     Status: Abnormal   Collection Time: 10/24/14  3:11 PM  Result Value Ref Range   Glucose-Capillary 244 (H) 65 - 99 mg/dL  Glucose, capillary     Status: Abnormal   Collection Time: 10/24/14  4:30 PM  Result Value Ref Range   Glucose-Capillary 211 (H) 65 - 99 mg/dL   Comment 1 Notify RN    Comment 2 Document in Chart   Glucose, capillary     Status: Abnormal   Collection Time: 10/24/14  9:15 PM  Result Value Ref Range   Glucose-Capillary 200 (H) 65 - 99 mg/dL  Prepare RBC     Status: None   Collection Time: 10/24/14 11:44 PM  Result Value Ref Range   Order Confirmation ORDER PROCESSED BY BLOOD BANK   ABO/Rh     Status: None   Collection Time: 10/24/14 11:44 PM  Result Value Ref  Range   ABO/RH(D) A POS   Type and screen     Status: None (Preliminary result)   Collection Time: 10/25/14 12:46 AM  Result Value Ref Range   ABO/RH(D) A POS    Antibody Screen NEG    Sample Expiration 10/28/2014    Unit Number [id number]    Blood Component Type RED CELLS,LR    Unit division 00    Status of Unit ISSUED,FINAL    Transfusion Status OK TO TRANSFUSE    Crossmatch Result Compatible    Unit Number [id number]    Blood Component Type RED CELLS,LR    Unit division 00    Status of Unit ISSUED,FINAL    Transfusion Status OK TO TRANSFUSE    Crossmatch Result Compatible    Unit Number [id number]    Blood Component Type RED CELLS,LR    Unit division 00    Status of Unit ALLOCATED    Transfusion Status OK TO TRANSFUSE    Crossmatch Result Compatible    Unit Number [id number]    Blood Component Type  RED CELLS,LR    Unit division 00    Status of Unit ALLOCATED    Transfusion Status OK TO TRANSFUSE    Crossmatch Result Compatible   Basic metabolic panel     Status: Abnormal   Collection Time: 10/25/14  3:02 AM  Result Value Ref Range   Sodium 129 (L) 135 - 145 mmol/L   Potassium 3.8 3.5 - 5.1 mmol/L    Comment: RESULT REPEATED AND VERIFIED DELTA CHECK NOTED NO VISIBLE HEMOLYSIS    Chloride 94 (L) 101 - 111 mmol/L   CO2 26 22 - 32 mmol/L   Glucose, Bld 287 (H) 65 - 99 mg/dL   BUN 13 6 - 20 mg/dL   Creatinine, Ser 1.11 (H) 0.44 - 1.00 mg/dL   Calcium 7.7 (L) 8.9 - 10.3 mg/dL   GFR calc non Af Amer 58 (L) >60 mL/min   GFR calc Af Amer >60 >60 mL/min    Comment: (NOTE) The eGFR has been calculated using the CKD EPI equation. This calculation has not been validated in all clinical situations. eGFR's persistently <60 mL/min signify possible Chronic Kidney Disease.    Anion gap 9 5 - 15  Glucose, capillary     Status: Abnormal   Collection Time: 10/25/14  7:28 AM  Result Value Ref Range   Glucose-Capillary 315 (H) 65 - 99 mg/dL   Comment 1 Notify RN    Comment 2 Document in Chart   Prepare RBC (crossmatch)     Status: None   Collection Time: 10/25/14  7:30 AM  Result Value Ref Range   Order Confirmation ORDER PROCESSED BY BLOOD BANK   CBC with Differential/Platelet     Status: Abnormal   Collection Time: 10/25/14  9:18 AM  Result Value Ref Range   WBC 12.5 (H) 4.0 - 10.5 K/uL   RBC 2.97 (L) 3.87 - 5.11 MIL/uL   Hemoglobin 8.9 (L) 12.0 - 15.0 g/dL   HCT 26.5 (L) 36.0 - 46.0 %   MCV 89.2 78.0 - 100.0 fL   MCH 30.0 26.0 - 34.0 pg   MCHC 33.6 30.0 - 36.0 g/dL   RDW 14.3 11.5 - 15.5 %   Platelets 217 150 - 400 K/uL   Neutrophils Relative % 83 (H) 43 - 77 %   Neutro Abs 10.5 (H) 1.7 - 7.7 K/uL   Lymphocytes Relative 10 (L) 12 - 46 %   Lymphs Abs 1.2  0.7 - 4.0 K/uL   Monocytes Relative 6 3 - 12 %   Monocytes Absolute 0.7 0.1 - 1.0 K/uL   Eosinophils Relative 1 0 - 5 %    Eosinophils Absolute 0.1 0.0 - 0.7 K/uL   Basophils Relative 0 0 - 1 %   Basophils Absolute 0.0 0.0 - 0.1 K/uL  Glucose, capillary     Status: Abnormal   Collection Time: 10/25/14 11:22 AM  Result Value Ref Range   Glucose-Capillary 263 (H) 65 - 99 mg/dL   Comment 1 Notify RN    Comment 2 Document in Chart   Glucose, capillary     Status: Abnormal   Collection Time: 10/25/14  4:44 PM  Result Value Ref Range   Glucose-Capillary 231 (H) 65 - 99 mg/dL   Comment 1 Notify RN    Comment 2 Document in Chart   Glucose, capillary     Status: Abnormal   Collection Time: 10/25/14  9:01 PM  Result Value Ref Range   Glucose-Capillary 265 (H) 65 - 99 mg/dL  Basic metabolic panel     Status: Abnormal   Collection Time: 10/26/14  4:45 AM  Result Value Ref Range   Sodium 133 (L) 135 - 145 mmol/L   Potassium 3.4 (L) 3.5 - 5.1 mmol/L   Chloride 96 (L) 101 - 111 mmol/L   CO2 28 22 - 32 mmol/L   Glucose, Bld 239 (H) 65 - 99 mg/dL   BUN 14 6 - 20 mg/dL   Creatinine, Ser 0.94 0.44 - 1.00 mg/dL   Calcium 8.2 (L) 8.9 - 10.3 mg/dL   GFR calc non Af Amer >60 >60 mL/min   GFR calc Af Amer >60 >60 mL/min    Comment: (NOTE) The eGFR has been calculated using the CKD EPI equation. This calculation has not been validated in all clinical situations. eGFR's persistently <60 mL/min signify possible Chronic Kidney Disease.    Anion gap 9 5 - 15  CBC     Status: Abnormal   Collection Time: 10/26/14  4:45 AM  Result Value Ref Range   WBC 11.2 (H) 4.0 - 10.5 K/uL   RBC 3.10 (L) 3.87 - 5.11 MIL/uL   Hemoglobin 9.3 (L) 12.0 - 15.0 g/dL   HCT 27.9 (L) 36.0 - 46.0 %   MCV 90.0 78.0 - 100.0 fL   MCH 30.0 26.0 - 34.0 pg   MCHC 33.3 30.0 - 36.0 g/dL   RDW 14.7 11.5 - 15.5 %   Platelets 230 150 - 400 K/uL  Glucose, capillary     Status: Abnormal   Collection Time: 10/26/14  7:43 AM  Result Value Ref Range   Glucose-Capillary 290 (H) 65 - 99 mg/dL  Glucose, capillary     Status: Abnormal   Collection Time:  10/26/14 12:35 PM  Result Value Ref Range   Glucose-Capillary 219 (H) 65 - 99 mg/dL   Comment 1 Notify RN    Comment 2 Document in Chart     No results found.    ROS:  Gen: Denies fever, chills, weight change, fatigue, night sweats MSK:  Reports throbbing pain to RLE HEENT: Denies blurred vision, double vision, hearing loss, tinnitus, sinus congestion, rhinorrhea, sore throat, neck stiffness, dysphagia PULM: Denies shortness of breath, cough, sputum production, hemoptysis, wheezing CV: Denies chest pain, edema, orthopnea, paroxysmal nocturnal dyspnea, palpitations GI: Denies abdominal pain, nausea, vomiting, diarrhea, hematochezia, melena, constipation, change in bowel habits GU: Denies dysuria, hematuria, polyuria, oliguria, urethral discharge Endocrine: Denies hot  or cold intolerance, polyuria, polyphagia or appetite change Derm: Denies rash, dry skin, scaling or peeling skin change Heme: Denies easy bruising, bleeding, bleeding gums Neuro: Denies headache, numbness, weakness, slurred speech, loss of memory or consciousness    PE:  Blood pressure 126/67, pulse 78, temperature 98.3 F (36.8 C), temperature source Oral, resp. rate 18, height '5\' 5"'  (1.651 m), weight 104.327 kg (230 lb), last menstrual period 10/09/2014, SpO2 97 %.   Objective:   General: Obese patient resting comfortably in NAD. Psych:  Appropriate mood and affect. Neuro:  A&O x 3, Moving all extremities, sensation intact to light touch over dorsal aspect of R foot.  Subjectively decreased over plantar aspect at forefoot. HEENT:  EOMs intact Chest:  Even non-labored respirations Skin: Dressing and packing were removed.  3 open incision sites at the R 2nd toe, medial plantar forefoot, and lateral dorsal forefoot were inspected. Necrotic dusky cutaneous tissue is visible at the lateral border or the 2nd R toe incision site. Extremities: Warmth noted over distal forefoot with blisters on dorsal forefoot at 3rd and  4th MTP jts.  Moderate edema throughout forefoot, midfoot, and up to hindfoot and ankle.   Erythema friable tissue with minimal purulence noted in medial and dorsal incision sites.  Bone exposure of head of 2nd ray visible upon inspection and toe amp site, along with erythematous and friable tissue.   No obvious lymphadenopathy and streaking up LE. 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  With her clinical presentation and coupled with her recent infection we believe it is medically necessary for the patient to be scheduled for a revision of the 2nd R ray amputation in order to prevent further spread of infection and further loss of tissue.  She may need revision to BKA.  I explained the risks and benefits of surgery, risks including loss of limb, blood clots, further infection, PE, blood loss, and even death.  She states that she wants to do whatever is necessary to stop the spread of the infection.  She will be transferred to [facility] and posted for surgery.  I explained the importance of this procedure as we are attempting to preserve her healthy tissue, overall health and well being.  She notes understanding and agrees with the plan.    -NPO p MN -Hold lovenox  -Consent for R 2nd Ray amputation revision with possibility of R BKA -Tx to [facility] for planned OR for 6/7, discussed with hospitalist service    Mechele Claude, PA-C, [place]:  [phone]   10/26/2014, 2:41 PM     Pt seen and examined.  Agree with findings and plan as documented above.

## 2014-10-26 NOTE — Progress Notes (Signed)
ANTIBIOTIC CONSULT NOTE - FOLLOW UP  Pharmacy Consult for Vancomycin/Zosyn Indication: cellulitis, osteomyelitis  Allergies  Allergen Reactions  . Lithium Shortness Of Breath  . Milk-Related Compounds Nausea And Vomiting  . Other     Bell peppers. Mouth sores.    . Abilify [Aripiprazole] Rash  . Aripiprazole Rash    Patient Measurements: Height: 5\' 5"  (165.1 cm) Weight: 230 lb (104.327 kg) IBW/kg (Calculated) : 57  Vital Signs: Temp: 98.3 F (36.8 C) (06/06 0459) Temp Source: Oral (06/06 0459) BP: 126/67 mmHg (06/06 0459) Pulse Rate: 78 (06/06 0459) Intake/Output from previous day: 06/05 0701 - 06/06 0700 In: 1020.4 [P.O.:360; I.V.:74.6; Blood:246; IV Piggyback:339.8] Out: 1300 [Urine:1300] Intake/Output from this shift: Total I/O In: 10 [I.V.:10] Out: -   Labs:  Recent Labs  10/24/14 0501 10/25/14 0302 10/25/14 0918 10/26/14 0445  WBC 15.9*  --  12.5* 11.2*  HGB 8.1*  --  8.9* 9.3*  PLT 242  --  217 230  CREATININE 0.95 1.11*  --  0.94   Estimated Creatinine Clearance: 87.7 mL/min (by C-G formula based on Cr of 0.94). No results for input(s): VANCOTROUGH, VANCOPEAK, VANCORANDOM, GENTTROUGH, GENTPEAK, GENTRANDOM, TOBRATROUGH, TOBRAPEAK, TOBRARND, AMIKACINPEAK, AMIKACINTROU, AMIKACIN in the last 72 hours.   Microbiology: Recent Results (from the past 720 hour(s))  Culture, blood (routine x 2)     Status: None (Preliminary result)   Collection Time: 10/24/14  9:07 AM  Result Value Ref Range Status   Specimen Description BLOOD RIGHT ARM  Final   Special Requests BOTTLES DRAWN AEROBIC ONLY 10CC AEROBIC ONLY   Final   Culture   Final           BLOOD CULTURE RECEIVED NO GROWTH TO DATE CULTURE WILL BE HELD FOR 5 DAYS BEFORE ISSUING A FINAL NEGATIVE REPORT Performed at [facility]    Report Status PENDING  Incomplete  Culture, blood (routine x 2)     Status: None (Preliminary result)   Collection Time: 10/24/14  9:12 AM  Result Value Ref Range Status    Specimen Description BLOOD RIGHT HAND  Final   Special Requests BOTTLES DRAWN AEROBIC ONLY 5CC AEROBIC ONLY   Final   Culture   Final           BLOOD CULTURE RECEIVED NO GROWTH TO DATE CULTURE WILL BE HELD FOR 5 DAYS BEFORE ISSUING A FINAL NEGATIVE REPORT Performed at [facility]    Report Status PENDING  Incomplete  Surgical pcr screen     Status: None   Collection Time: 10/24/14 10:21 AM  Result Value Ref Range Status   MRSA, PCR NEGATIVE NEGATIVE Final   Staphylococcus aureus NEGATIVE NEGATIVE Final    Comment:        The Xpert SA Assay (FDA approved for NASAL specimens in patients over 49 years of age), is one component of a comprehensive surveillance program.  Test performance has been validated by [facility] for patients greater than or equal to 49 year old. It is not intended to diagnose infection nor to guide or monitor treatment.   Wound culture     Status: None (Preliminary result)   Collection Time: 10/24/14 10:27 AM  Result Value Ref Range Status   Specimen Description ABSCESS  Final   Special Requests NONE  Final   Gram Stain   Final    MODERATE WBC PRESENT, PREDOMINANTLY PMN RARE SQUAMOUS EPITHELIAL CELLS PRESENT ABUNDANT GRAM POSITIVE COCCI IN PAIRS Performed at [facility]  Final    Culture reincubated for better growth Performed at [facility]    Report Status PENDING  Incomplete  Anaerobic culture     Status: None (Preliminary result)   Collection Time: 10/24/14  1:45 PM  Result Value Ref Range Status   Specimen Description TOE RIGHT ABSCESS  Final   Special Requests NONE  Final   Gram Stain PENDING  Incomplete   Culture   Final    NO ANAEROBES ISOLATED; CULTURE IN PROGRESS FOR 5 DAYS Performed at [facility]    Report Status PENDING  Incomplete  Culture, routine-abscess     Status: None (Preliminary result)   Collection Time: 10/24/14  1:45 PM  Result Value Ref Range Status   Specimen  Description TOE RIGHT ABSCESS  Final   Special Requests NONE  Final   Gram Stain   Final    ABUNDANT WBC PRESENT, PREDOMINANTLY PMN NO SQUAMOUS EPITHELIAL CELLS SEEN ABUNDANT GRAM POSITIVE COCCI IN PAIRS Performed at [facility]    Culture   Final    MODERATE ENTEROCOCCUS SPECIES Performed at [facility]    Report Status PENDING  Incomplete    Anti-infectives    Start     Dose/Rate Route Frequency Ordered Stop   10/24/14 1800  vancomycin (VANCOCIN) IVPB 750 mg/150 ml premix     750 mg 150 mL/hr over 60 Minutes Intravenous Every 12 hours 10/24/14 0410     10/24/14 1000  clindamycin (CLEOCIN) capsule 450 mg  Status:  Discontinued     450 mg Oral 3 times daily 10/24/14 0346 10/24/14 0401   10/24/14 0800  piperacillin-tazobactam (ZOSYN) IVPB 3.375 g     3.375 g 12.5 mL/hr over 240 Minutes Intravenous Every 8 hours 10/24/14 0356     10/24/14 0400  vancomycin (VANCOCIN) 2,000 mg in sodium chloride 0.9 % 500 mL IVPB  Status:  Discontinued     2,000 mg 250 mL/hr over 120 Minutes Intravenous  Once 10/24/14 0355 10/24/14 1300   10/24/14 0130  vancomycin (VANCOCIN) 2,000 mg in sodium chloride 0.9 % 500 mL IVPB  Status:  Discontinued     2,000 mg 250 mL/hr over 120 Minutes Intravenous  Once 10/24/14 0121 10/24/14 0625   10/24/14 0015  piperacillin-tazobactam (ZOSYN) IVPB 3.375 g     3.375 g 12.5 mL/hr over 240 Minutes Intravenous  Once 10/24/14 0008 10/24/14 0217      Assessment: 49 yoF on vancomycin and zosyn for R foot cellulitis with right second toe gangrene, osteomyelitis of right second toe s/p right toe amputation on 6/4.  Following toe amputation, ortho recommending BKA which is planned for Tuesday or Wednesday this week.  Ortho recommending to continue IV antibiotics.  6/4 >>vancomycin  >> 6/4 >>zosyn  >>    6/4 blood: ngtd 6/4 abscess: pending 6/4 abscess R toe: moderate Enterococcus species  Goal of Therapy:  Vancomycin trough level 15-20 mcg/ml   Doses adjusted per renal function Eradication of infection  Plan:  1.  Obtain vancomycin trough tonight. 2.  Continue Zosyn 3.375g IV q8h (4 hour infusion time).  3.  F/u narrowing of antibiotics once cultures finalized.  F/u planned length of treatment for antibiotics following BKA.  Hershal Coria 10/26/2014,12:43 PM

## 2014-10-26 NOTE — Progress Notes (Addendum)
[facility]                                                                              Patient Demographics  Mallory Potts, is a 49 y.o. female, DOB - 10-06-1965, [id number]  Admit date - 10/23/2014   Admitting Physician Phillips Grout, MD  Outpatient Primary MD for the patient is Arnoldo Morale, MD  LOS - 2   Chief Complaint  Patient presents with  . Cellulitis in foot        Brief HPI   Patient is a 49 year old female with insulin-dependent diabetes mellitus, COPD, hypertension presented with right foot pain and swelling despite outpatient antibiotics. Patient noted that approximately 2 weeks ago she had hit her second toe and broke the distal portion of the toe. Since then she's been having pain and swelling which has been progressively worsening. 10 days ago she noticed swelling which has now progressed up to her foot. She was seen in ED on 5/24. She was diagnosed with cellulitis and placed on oral clindamycin. Patient reports that over the last 4 days prior to admission, she noticed worsening of the pain and swelling and hence she presented to the ER for further workup.   Assessment & Plan    Principal Problem:   Cellulitis of right foot with right second toe gangrene, osteomyelitis of right second toe - Continue IV vancomycin and Zosyn - Appreciate orthopedic recommendations, postop day #2 with a ray amputation of the right second toe - Orthopedics following, recommending BKA, possibly 6/7 or 6/8 Addendum:  Called by orthopedics, [name], PA with Dr Doran Durand, planning OR tomorrow for revision surgery and possible BKA, right lower extremity. Recommended to transfer patient to [facility] for surgery in a.m., NPO after midnight. Sign out given to Dr. Wynelle Cleveland.    Active Problems:   DM (diabetes mellitus) - Placed on Levemir, sliding scale insulin, restart diet until or scheduled     Epilepsy Continue Tegretol    COPD (chronic obstructive pulmonary disease) -  Currently stable, continue albuterol nebs as needed, Spiriva    Bipolar 1 disorder - Continue trazodone  Hypothyroidism  - Continue Synthroid   Hypokalemia  - Continue replacement   Obesity - Counseled patient on diet and weight control  Code Status: Full code   Family Communication: Discussed in detail with the patient, all imaging results, lab results explained to the patient and sister at the bedside  Disposition Plan: Transfer to [facility] for surgery in am  Time Spent in minutes   25 minutes  Procedures  Mri rt foot  Consults   ortho  DVT Prophylaxis  scds  Medications  Scheduled Meds: . sodium chloride   Intravenous Once  . antiseptic oral rinse  7 mL Mouth Rinse BID  . carbamazepine  200 mg Oral Daily  . carbamazepine  400 mg Oral QHS  . chlorthalidone  25 mg Oral Daily  . enoxaparin (LOVENOX) injection  30 mg Subcutaneous Q24H  . fluticasone  1 spray Each Nare Daily  . gabapentin  100 mg Oral BID  . insulin aspart  0-15 Units Subcutaneous TID WC  .  insulin aspart  0-5 Units Subcutaneous QHS  . insulin glargine  15 Units Subcutaneous QHS  . levothyroxine  50 mcg Oral QAC breakfast  . meloxicam  15 mg Oral Daily  . pantoprazole  40 mg Oral Daily  . piperacillin-tazobactam (ZOSYN)  IV  3.375 g Intravenous Q8H  . sodium chloride  10-40 mL Intracatheter Q12H  . tiotropium  18 mcg Inhalation Daily  . traZODone  50 mg Oral QHS  . vancomycin  750 mg Intravenous Q12H   Continuous Infusions: . sodium chloride 10 mL/hr at 10/24/14 1748   PRN Meds:.acetaminophen **OR** acetaminophen, acetaminophen, albuterol, diphenhydrAMINE, HYDROcodone-acetaminophen, HYDROmorphone (DILAUDID) injection, metoCLOPramide **OR** metoCLOPramide (REGLAN) injection, morphine injection, ondansetron **OR** ondansetron (ZOFRAN) IV, oxyCODONE, sodium chloride   Antibiotics   Anti-infectives    Start     Dose/Rate Route Frequency Ordered Stop   10/24/14 1800  vancomycin  (VANCOCIN) IVPB 750 mg/150 ml premix     750 mg 150 mL/hr over 60 Minutes Intravenous Every 12 hours 10/24/14 0410     10/24/14 1000  clindamycin (CLEOCIN) capsule 450 mg  Status:  Discontinued     450 mg Oral 3 times daily 10/24/14 0346 10/24/14 0401   10/24/14 0800  piperacillin-tazobactam (ZOSYN) IVPB 3.375 g     3.375 g 12.5 mL/hr over 240 Minutes Intravenous Every 8 hours 10/24/14 0356     10/24/14 0400  vancomycin (VANCOCIN) 2,000 mg in sodium chloride 0.9 % 500 mL IVPB  Status:  Discontinued     2,000 mg 250 mL/hr over 120 Minutes Intravenous  Once 10/24/14 0355 10/24/14 1300   10/24/14 0130  vancomycin (VANCOCIN) 2,000 mg in sodium chloride 0.9 % 500 mL IVPB  Status:  Discontinued     2,000 mg 250 mL/hr over 120 Minutes Intravenous  Once 10/24/14 0121 10/24/14 0625   10/24/14 0015  piperacillin-tazobactam (ZOSYN) IVPB 3.375 g     3.375 g 12.5 mL/hr over 240 Minutes Intravenous  Once 10/24/14 0008 10/24/14 0217        Subjective:   Mallory Potts was seen and examined today. No complaints at this time, sister at the bedside. Dressing intact on the right foot, no fevers or chills.  Patient denies dizziness, chest pain, shortness of breath, abdominal pain, N/V/D/C, new weakness, numbess, tingling. No acute events overnight.    Objective:   Blood pressure 126/67, pulse 78, temperature 98.3 F (36.8 C), temperature source Oral, resp. rate 18, height 5\' 5"  (1.651 m), weight 104.327 kg (230 lb), last menstrual period 10/09/2014, SpO2 97 %.  Wt Readings from Last 3 Encounters:  10/24/14 104.327 kg (230 lb)  02/25/13 107.684 kg (237 lb 6.4 oz)  07/25/11 108.863 kg (240 lb)     Intake/Output Summary (Last 24 hours) at 10/26/14 1139 Last data filed at 10/26/14 1009  Gross per 24 hour  Intake  714.4 ml  Output   1300 ml  Net -585.6 ml    Exam  General: Alert and oriented x 3, NAD  HEENT:  PERRLA, EOMI, A  Neck: Supple, no JVD  CVS: S1 S2 clear, RRR  Respiratory:  CTAB  Abdomen: obese, Soft, NT, ND, NBS  Ext: no cyanosis clubbing or edema  Neuro: no new neuro deficits  Skin: Dressing intact on the right foot  Psych: Anxious and oriented 3, normal affect   Data Review   Micro Results Recent Results (from the past 240 hour(s))  Culture, blood (routine x 2)     Status: None (Preliminary result)   Collection  Time: 10/24/14  9:07 AM  Result Value Ref Range Status   Specimen Description BLOOD RIGHT ARM  Final   Special Requests BOTTLES DRAWN AEROBIC ONLY 10CC AEROBIC ONLY   Final   Culture   Final           BLOOD CULTURE RECEIVED NO GROWTH TO DATE CULTURE WILL BE HELD FOR 5 DAYS BEFORE ISSUING A FINAL NEGATIVE REPORT Performed at [facility]    Report Status PENDING  Incomplete  Culture, blood (routine x 2)     Status: None (Preliminary result)   Collection Time: 10/24/14  9:12 AM  Result Value Ref Range Status   Specimen Description BLOOD RIGHT HAND  Final   Special Requests BOTTLES DRAWN AEROBIC ONLY 5CC AEROBIC ONLY   Final   Culture   Final           BLOOD CULTURE RECEIVED NO GROWTH TO DATE CULTURE WILL BE HELD FOR 5 DAYS BEFORE ISSUING A FINAL NEGATIVE REPORT Performed at [facility]    Report Status PENDING  Incomplete  Surgical pcr screen     Status: None   Collection Time: 10/24/14 10:21 AM  Result Value Ref Range Status   MRSA, PCR NEGATIVE NEGATIVE Final   Staphylococcus aureus NEGATIVE NEGATIVE Final    Comment:        The Xpert SA Assay (FDA approved for NASAL specimens in patients over 50 years of age), is one component of a comprehensive surveillance program.  Test performance has been validated by [facility] for patients greater than or equal to 9 year old. It is not intended to diagnose infection nor to guide or monitor treatment.   Wound culture     Status: None (Preliminary result)   Collection Time: 10/24/14 10:27 AM  Result Value Ref Range Status   Specimen Description ABSCESS  Final    Special Requests NONE  Final   Gram Stain   Final    MODERATE WBC PRESENT, PREDOMINANTLY PMN RARE SQUAMOUS EPITHELIAL CELLS PRESENT ABUNDANT GRAM POSITIVE COCCI IN PAIRS Performed at [facility]    Culture   Final    Culture reincubated for better growth Performed at [facility]    Report Status PENDING  Incomplete  Anaerobic culture     Status: None (Preliminary result)   Collection Time: 10/24/14  1:45 PM  Result Value Ref Range Status   Specimen Description TOE RIGHT ABSCESS  Final   Special Requests NONE  Final   Gram Stain PENDING  Incomplete   Culture   Final    NO ANAEROBES ISOLATED; CULTURE IN PROGRESS FOR 5 DAYS Performed at [facility]    Report Status PENDING  Incomplete  Culture, routine-abscess     Status: None (Preliminary result)   Collection Time: 10/24/14  1:45 PM  Result Value Ref Range Status   Specimen Description TOE RIGHT ABSCESS  Final   Special Requests NONE  Final   Gram Stain   Final    ABUNDANT WBC PRESENT, PREDOMINANTLY PMN NO SQUAMOUS EPITHELIAL CELLS SEEN ABUNDANT GRAM POSITIVE COCCI IN PAIRS Performed at [facility]    Culture   Final    MODERATE ENTEROCOCCUS SPECIES Performed at [facility]    Report Status PENDING  Incomplete    Radiology Reports Dg Foot Complete Right  10/24/2014   CLINICAL DATA:  Cellulitis of the foot. Second toe fracture 2 weeks prior.  EXAM: RIGHT FOOT COMPLETE - 3+ VIEW  COMPARISON:  Radiographs 10/13/2014  FINDINGS: Avulsion fracture from the distal seconds tuft, unchanged in appearance. No acute fracture. 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 No frank osseous erosions or periosteal reaction. Plantar calcaneal spur and Achilles tendon enthesophyte again seen. There is progressive soft tissue edema about the foot. No radiographic findings of soft tissue air or foreign body.  IMPRESSION: 1. Avulsion fracture of the distal second toe, unchanged in alignment.  2. Increased soft tissue edema about the foot. No radiographic findings of osteomyelitis or soft tissue air.   Electronically Signed   By: Jeb Levering M.D.   On: 10/24/2014 01:04   Dg Foot Complete Right  10/13/2014   CLINICAL DATA:  Second toe right foot injury 2 weeks ago  EXAM: RIGHT FOOT COMPLETE - 3+ VIEW  COMPARISON:  None.  FINDINGS: Three views of the right foot submitted. There is avulsion fracture at the tip of distal phalanx second toe. There is soft tissue swelling and soft tissue irregularity second toe.  IMPRESSION: Avulsion fracture at the tip of distal phalanx second toe. Soft tissue swelling noted second toe.   Electronically Signed   By: Lahoma Crocker M.D.   On: 10/13/2014 18:28    CBC  Recent Labs Lab 10/23/14 2040 10/24/14 0501 10/25/14 0918 10/26/14 0445  WBC 20.0* 15.9* 12.5* 11.2*  HGB 9.2* 8.1* 8.9* 9.3*  HCT 27.6* 24.6* 26.5* 27.9*  PLT 270 242 217 230  MCV 91.7 89.8 89.2 90.0  MCH 30.6 29.6 30.0 30.0  MCHC 33.3 32.9 33.6 33.3  RDW 14.0 13.9 14.3 14.7  LYMPHSABS 1.2  --  1.2  --   MONOABS 1.0  --  0.7  --   EOSABS 0.0  --  0.1  --   BASOSABS 0.0  --  0.0  --     Chemistries   Recent Labs Lab 10/23/14 2040 10/24/14 0501 10/25/14 0302 10/26/14 0445  NA 122* 126* 129* 133*  K 3.2* 3.1* 3.8 3.4*  CL 88* 88* 94* 96*  CO2 24 28 26 28   GLUCOSE 401* 362* 287* 239*  BUN 15 14 13 14   CREATININE 1.09* 0.95 1.11* 0.94  CALCIUM 7.9* 7.9* 7.7* 8.2*  AST 19  --   --   --   ALT 16  --   --   --   ALKPHOS 63  --   --   --   BILITOT 0.2*  --   --   --    ------------------------------------------------------------------------------------------------------------------ estimated creatinine clearance is 87.7 mL/min (by C-G formula based on Cr of 0.94). ------------------------------------------------------------------------------------------------------------------ No results for input(s): HGBA1C in the last 72  hours. ------------------------------------------------------------------------------------------------------------------ No results for input(s): CHOL, HDL, LDLCALC, TRIG, CHOLHDL, LDLDIRECT in the last 72 hours. ------------------------------------------------------------------------------------------------------------------ No results for input(s): TSH, T4TOTAL, T3FREE, THYROIDAB in the last 72 hours.  Invalid input(s): FREET3 ------------------------------------------------------------------------------------------------------------------ No results for input(s): VITAMINB12, FOLATE, FERRITIN, TIBC, IRON, RETICCTPCT in the last 72 hours.  Coagulation profile No results for input(s): INR, PROTIME in the last 168 hours.  No results for input(s): DDIMER in the last 72 hours.  Cardiac Enzymes No results for input(s): CKMB, TROPONINI, MYOGLOBIN in the last 168 hours.  Invalid input(s): CK ------------------------------------------------------------------------------------------------------------------ Invalid input(s): [place]  10/24/14 2115 10/25/14 0728 10/25/14 1122 10/25/14 1644 10/25/14 2101 10/26/14 0743  GLUCAP 200* 315* 263* 231* 265* 290*     Terrin Meddaugh M.D. [facility] 10/26/2014, 11:39 AM  Pager: [phone]   Between 7am to 7pm - call Pager - [phone]  After 7pm go to [email or web address] - password [email or web address]  Call night coverage person covering after 7pm

## 2014-10-26 NOTE — Progress Notes (Signed)
Pt to transfer to [place] for potential BKA in am. Report called to receiving RN. [facility] at bedside to transport.

## 2014-10-26 NOTE — Progress Notes (Signed)
Inpatient Diabetes Program Recommendations  AACE/ADA: New Consensus Statement on Inpatient Glycemic Control (2013)  Target Ranges:  Prepandial:   less than 140 mg/dL      Peak postprandial:   less than 180 mg/dL (1-2 hours)      Critically ill patients:  140 - 180 mg/dL   Reason for Visit: Hyperglycemia  Diabetes history: DM2 Outpatient Diabetes medications: glipizide 5 mg bid, Janumet 50/1000 mg bid, Lantus 15 units QHS Current orders for Inpatient glycemic control: Lantus 15 units QHS, Novolog moderate tidwc and hs  Results for Mallory Potts, Mallory Potts (MRN [id number]) as of 10/26/2014 12:18  Ref. Range 10/25/2014 07:28 10/25/2014 11:22 10/25/2014 16:44 10/25/2014 21:01 10/26/2014 07:43  Glucose-Capillary Latest Ref Range: 65-99 mg/dL 315 (H) 263 (H) 231 (H) 265 (H) 290 (H)  Results for Mallory Potts, Mallory Potts (MRN [id number]) as of 10/26/2014 12:18  Ref. Range 10/24/2014 09:07  Hemoglobin A1C Latest Ref Range: 4.8-5.6 % 11.1 (H)    Inpatient Diabetes Program Recommendations Insulin - Basal: Increase Lantus to 20 units QHS Correction (SSI): Increase Novolog to resistant tidwc and hs Insulin - Meal Coverage: May benefit from meal coverage insulin - Novolog 4 units tidwc if pt eats > 50% meal. HgbA1C: 11.1% - uncontrolled  Note: To speak with pt regarding importance of glycemic control at home this afternoon.   Thank you. Lorenda Peck, RD, LDN, CDE Inpatient Diabetes Coordinator [phone]

## 2014-10-26 NOTE — Progress Notes (Signed)
Pt received into [facility] from [facility] via [facility] transport. Rating pain in R foot 8/10. Alert and oriented. Dr. Wynelle Cleveland notified of patient arrival. Will continue to monitor. Bartholomew Crews, RN

## 2014-10-26 NOTE — Progress Notes (Signed)
Patient ID: Mallory Potts, female   DOB: 12-Oct-1965, 49 y.o.   MRN: [id number]  I have spoken to Dr. [name] of out practice who has agreed to see patient and provide definitive care for her right foot diabetic infection His PA will see today and take photos as Dr. Doran Durand is off today  Planned treatment probably Tuesday or Wednesday  Continue IV antiobiotics

## 2014-10-27 ENCOUNTER — Encounter (HOSPITAL_COMMUNITY)
Admission: EM | Disposition: A | Discharge status: Skilled Nursing Facility | Payer: Self-pay | Source: Home / Self Care | Attending: Internal Medicine

## 2014-10-27 ENCOUNTER — Inpatient Hospital Stay (HOSPITAL_COMMUNITY): Payer: Medicare Other | Admitting: Anesthesiology

## 2014-10-27 DIAGNOSIS — E1169 Type 2 diabetes mellitus with other specified complication: Principal | ICD-10-CM

## 2014-10-27 DIAGNOSIS — L089 Local infection of the skin and subcutaneous tissue, unspecified: Secondary | ICD-10-CM

## 2014-10-27 HISTORY — PX: AMPUTATION: SHX166

## 2014-10-27 LAB — CREATININE, SERUM
CREATININE: 1.35 mg/dL — AB (ref 0.44–1.00)
GFR calc Af Amer: 53 mL/min — ABNORMAL LOW (ref >60)
GFR calc non Af Amer: 46 mL/min — ABNORMAL LOW (ref >60)

## 2014-10-27 LAB — CBC
HEMATOCRIT: 28.6 % — AB (ref 36.0–46.0)
Hemoglobin: 9.6 g/dL — ABNORMAL LOW (ref 12.0–15.0)
MCH: 30.2 pg (ref 26.0–34.0)
MCHC: 33.6 g/dL (ref 30.0–36.0)
MCV: 89.9 fL (ref 78.0–100.0)
PLATELETS: 234 10*3/uL (ref 150–400)
RBC: 3.18 MIL/uL — ABNORMAL LOW (ref 3.87–5.11)
RDW: 14.3 % (ref 11.5–15.5)
WBC: 9.8 10*3/uL (ref 4.0–10.5)

## 2014-10-27 LAB — HCG, SERUM, QUALITATIVE: Preg, Serum: NEGATIVE

## 2014-10-27 LAB — WOUND CULTURE

## 2014-10-27 LAB — GLUCOSE, CAPILLARY
GLUCOSE-CAPILLARY: 204 mg/dL — AB (ref 65–99)
GLUCOSE-CAPILLARY: 119 mg/dL — AB (ref 65–99)
Glucose-Capillary: 142 mg/dL — ABNORMAL HIGH (ref 65–99)
Glucose-Capillary: 262 mg/dL — ABNORMAL HIGH (ref 65–99)

## 2014-10-27 LAB — CULTURE, ROUTINE-ABSCESS

## 2014-10-27 SURGERY — AMPUTATION BELOW KNEE
Anesthesia: General | Site: Leg Lower | Laterality: Right

## 2014-10-27 MED ORDER — METOCLOPRAMIDE HCL 5 MG/ML IJ SOLN
INTRAMUSCULAR | Status: DC | PRN
  Administered 2014-10-27: 10 mg via INTRAVENOUS

## 2014-10-27 MED ORDER — 0.9 % SODIUM CHLORIDE (POUR BTL) OPTIME
TOPICAL | Status: DC | PRN
  Administered 2014-10-27: 1000 mL

## 2014-10-27 MED ORDER — LIDOCAINE HCL (CARDIAC) 20 MG/ML IV SOLN
INTRAVENOUS | Status: DC | PRN
  Administered 2014-10-27: 50 mg via INTRAVENOUS

## 2014-10-27 MED ORDER — DOCUSATE SODIUM 100 MG PO CAPS
100.0000 mg | ORAL_CAPSULE | Freq: Two times a day (BID) | ORAL | Status: DC
  Administered 2014-10-27 – 2014-10-30 (×5): 100 mg via ORAL
  Filled 2014-10-27 (×6): qty 1

## 2014-10-27 MED ORDER — ARTIFICIAL TEARS OP OINT
TOPICAL_OINTMENT | OPHTHALMIC | Status: DC | PRN
  Administered 2014-10-27: 1 via OPHTHALMIC

## 2014-10-27 MED ORDER — MIDAZOLAM HCL 2 MG/2ML IJ SOLN
INTRAMUSCULAR | Status: AC
  Filled 2014-10-27: qty 2

## 2014-10-27 MED ORDER — SODIUM CHLORIDE 0.9 % IV SOLN: INTRAVENOUS | Status: DC

## 2014-10-27 MED ORDER — MIDAZOLAM HCL 5 MG/5ML IJ SOLN
INTRAMUSCULAR | Status: DC | PRN
  Administered 2014-10-27: 2 mg via INTRAVENOUS

## 2014-10-27 MED ORDER — FENTANYL CITRATE (PF) 250 MCG/5ML IJ SOLN
INTRAMUSCULAR | Status: AC
  Filled 2014-10-27: qty 5

## 2014-10-27 MED ORDER — OXYCODONE HCL 5 MG PO TABS
ORAL_TABLET | ORAL | Status: AC
  Filled 2014-10-27: qty 1

## 2014-10-27 MED ORDER — DEXTROSE 5 % IV SOLN
INTRAVENOUS | Status: DC | PRN
Start: 2014-10-27 — End: 2014-10-27
  Administered 2014-10-27: 16:00:00 via INTRAVENOUS

## 2014-10-27 MED ORDER — SUCCINYLCHOLINE CHLORIDE 20 MG/ML IJ SOLN
INTRAMUSCULAR | Status: DC | PRN
  Administered 2014-10-27: 160 mg via INTRAVENOUS

## 2014-10-27 MED ORDER — DEXAMETHASONE SODIUM PHOSPHATE 4 MG/ML IJ SOLN
INTRAMUSCULAR | Status: DC | PRN
  Administered 2014-10-27: 8 mg via INTRAVENOUS

## 2014-10-27 MED ORDER — FENTANYL CITRATE (PF) 100 MCG/2ML IJ SOLN
INTRAMUSCULAR | Status: DC | PRN
  Administered 2014-10-27 (×2): 50 ug via INTRAVENOUS
  Administered 2014-10-27: 100 ug via INTRAVENOUS
  Administered 2014-10-27: 50 ug via INTRAVENOUS
  Administered 2014-10-27: 250 ug via INTRAVENOUS

## 2014-10-27 MED ORDER — HYDROMORPHONE HCL 1 MG/ML IJ SOLN
INTRAMUSCULAR | Status: AC
  Filled 2014-10-27: qty 1

## 2014-10-27 MED ORDER — PROPOFOL 10 MG/ML IV BOLUS
INTRAVENOUS | Status: DC | PRN
  Administered 2014-10-27: 150 mg via INTRAVENOUS
  Administered 2014-10-27: 50 mg via INTRAVENOUS

## 2014-10-27 MED ORDER — HYDROMORPHONE HCL 1 MG/ML IJ SOLN
0.5000 mg | INTRAMUSCULAR | Status: DC | PRN
  Administered 2014-10-27 (×2): 0.5 mg via INTRAVENOUS

## 2014-10-27 MED ORDER — MIDAZOLAM HCL 2 MG/2ML IJ SOLN
0.5000 mg | Freq: Once | INTRAMUSCULAR | Status: AC | PRN
  Administered 2014-10-27: 2 mg via INTRAVENOUS

## 2014-10-27 MED ORDER — ENOXAPARIN SODIUM 40 MG/0.4ML ~~LOC~~ SOLN
40.0000 mg | SUBCUTANEOUS | Status: DC
  Administered 2014-10-28 – 2014-10-30 (×3): 40 mg via SUBCUTANEOUS
  Filled 2014-10-27 (×3): qty 0.4

## 2014-10-27 MED ORDER — VANCOMYCIN HCL IN DEXTROSE 1-5 GM/200ML-% IV SOLN
INTRAVENOUS | Status: AC
  Administered 2014-10-27: 1000 mg via INTRAVENOUS
  Filled 2014-10-27: qty 200

## 2014-10-27 MED ORDER — PROPOFOL 10 MG/ML IV BOLUS
INTRAVENOUS | Status: AC
  Filled 2014-10-27: qty 20

## 2014-10-27 MED ORDER — LACTATED RINGERS IV SOLN
INTRAVENOUS | Status: DC
  Administered 2014-10-27 (×2): via INTRAVENOUS

## 2014-10-27 MED ORDER — PROMETHAZINE HCL 25 MG/ML IJ SOLN: 6.2500 mg | INTRAMUSCULAR | Status: DC | PRN

## 2014-10-27 MED ORDER — MEPERIDINE HCL 25 MG/ML IJ SOLN: 6.2500 mg | INTRAMUSCULAR | Status: DC | PRN

## 2014-10-27 MED ORDER — HYDROMORPHONE HCL 1 MG/ML IJ SOLN
0.2500 mg | INTRAMUSCULAR | Status: DC | PRN
  Administered 2014-10-27 (×4): 0.5 mg via INTRAVENOUS

## 2014-10-27 MED ORDER — ONDANSETRON HCL 4 MG/2ML IJ SOLN
INTRAMUSCULAR | Status: DC | PRN
  Administered 2014-10-27 (×2): 4 mg via INTRAVENOUS

## 2014-10-27 MED ORDER — SENNA 8.6 MG PO TABS
1.0000 | ORAL_TABLET | Freq: Two times a day (BID) | ORAL | Status: DC
  Administered 2014-10-27 – 2014-10-30 (×5): 8.6 mg via ORAL
  Filled 2014-10-27 (×6): qty 1

## 2014-10-27 MED ORDER — INSULIN GLARGINE 100 UNIT/ML ~~LOC~~ SOLN
25.0000 [IU] | Freq: Every day | SUBCUTANEOUS | Status: DC
  Administered 2014-10-27 – 2014-10-29 (×3): 25 [IU] via SUBCUTANEOUS
  Filled 2014-10-27 (×4): qty 0.25

## 2014-10-27 SURGICAL SUPPLY — 61 items
BANDAGE ELASTIC 6 VELCRO ST LF (GAUZE/BANDAGES/DRESSINGS) IMPLANT
BANDAGE ESMARK 6X9 LF (GAUZE/BANDAGES/DRESSINGS) IMPLANT
BLADE SAW RECIP 87.9 MT (BLADE) ×3 IMPLANT
BNDG COHESIVE 6X5 TAN STRL LF (GAUZE/BANDAGES/DRESSINGS) ×6 IMPLANT
BNDG ELASTIC 6X15 VLCR STRL LF (GAUZE/BANDAGES/DRESSINGS) ×3 IMPLANT
BNDG ESMARK 6X9 LF (GAUZE/BANDAGES/DRESSINGS)
CANISTER SUCT 3000ML PPV (MISCELLANEOUS) IMPLANT
CHLORAPREP W/TINT 26ML (MISCELLANEOUS) IMPLANT
COVER SURGICAL LIGHT HANDLE (MISCELLANEOUS) ×3 IMPLANT
CUFF TOURNIQUET SINGLE 34IN LL (TOURNIQUET CUFF) ×3 IMPLANT
CUFF TOURNIQUET SINGLE 44IN (TOURNIQUET CUFF) IMPLANT
DRAPE EXTREMITY T 121X128X90 (DRAPE) ×3 IMPLANT
DRAPE INCISE IOBAN 66X45 STRL (DRAPES) ×3 IMPLANT
DRAPE PROXIMA HALF (DRAPES) ×6 IMPLANT
DRAPE U-SHAPE 47X51 STRL (DRAPES) ×6 IMPLANT
DRSG MEPITEL 4X7.2 (GAUZE/BANDAGES/DRESSINGS) ×3 IMPLANT
DRSG PAD ABDOMINAL 8X10 ST (GAUZE/BANDAGES/DRESSINGS) ×3 IMPLANT
ELECT CAUTERY BLADE 6.4 (BLADE) IMPLANT
ELECT REM PT RETURN 9FT ADLT (ELECTROSURGICAL) ×3
ELECTRODE REM PT RTRN 9FT ADLT (ELECTROSURGICAL) ×1 IMPLANT
EVACUATOR 1/8 PVC DRAIN (DRAIN) IMPLANT
GAUZE SPONGE 4X4 12PLY STRL (GAUZE/BANDAGES/DRESSINGS) ×3 IMPLANT
GLOVE BIO SURGEON STRL SZ7 (GLOVE) ×6 IMPLANT
GLOVE BIO SURGEON STRL SZ8 (GLOVE) ×3 IMPLANT
GLOVE BIOGEL PI IND STRL 7.5 (GLOVE) ×1 IMPLANT
GLOVE BIOGEL PI IND STRL 8 (GLOVE) ×1 IMPLANT
GLOVE BIOGEL PI INDICATOR 7.5 (GLOVE) ×2
GLOVE BIOGEL PI INDICATOR 8 (GLOVE) ×2
GOWN STRL REUS W/ TWL LRG LVL3 (GOWN DISPOSABLE) ×1 IMPLANT
GOWN STRL REUS W/ TWL XL LVL3 (GOWN DISPOSABLE) ×1 IMPLANT
GOWN STRL REUS W/TWL LRG LVL3 (GOWN DISPOSABLE) ×2
GOWN STRL REUS W/TWL XL LVL3 (GOWN DISPOSABLE) ×2
KIT BASIN OR (CUSTOM PROCEDURE TRAY) ×3 IMPLANT
KIT ROOM TURNOVER OR (KITS) ×3 IMPLANT
MANIFOLD NEPTUNE WASTE (CANNULA) ×3 IMPLANT
NS IRRIG 1000ML POUR BTL (IV SOLUTION) ×3 IMPLANT
PACK GENERAL/GYN (CUSTOM PROCEDURE TRAY) ×3 IMPLANT
PAD ARMBOARD 7.5X6 YLW CONV (MISCELLANEOUS) ×6 IMPLANT
PAD CAST 4YDX4 CTTN HI CHSV (CAST SUPPLIES) ×1 IMPLANT
PADDING CAST COTTON 4X4 STRL (CAST SUPPLIES) ×2
PADDING CAST COTTON 6X4 STRL (CAST SUPPLIES) ×3 IMPLANT
SPONGE LAP 18X18 X RAY DECT (DISPOSABLE) IMPLANT
STAPLER VISISTAT 35W (STAPLE) IMPLANT
STOCKINETTE IMPERVIOUS LG (DRAPES) ×3 IMPLANT
SUT ETHILON 2 0 FS 18 (SUTURE) ×18 IMPLANT
SUT MNCRL AB 3-0 PS2 18 (SUTURE) ×3 IMPLANT
SUT MON AB 3-0 SH 27 (SUTURE) ×8
SUT MON AB 3-0 SH27 (SUTURE) ×4 IMPLANT
SUT PDS 2 0 (SUTURE) IMPLANT
SUT PDS AB 0 CT 36 (SUTURE) ×3 IMPLANT
SUT PDS AB 1 CT  36 (SUTURE) ×4
SUT PDS AB 1 CT 36 (SUTURE) ×2 IMPLANT
SUT PROLENE 3 0 PS 2 (SUTURE) ×3 IMPLANT
SUT PROLENE 3 0 SH 48 (SUTURE) IMPLANT
SUT SILK 2 0 (SUTURE) ×2
SUT SILK 2-0 18XBRD TIE 12 (SUTURE) ×1 IMPLANT
SWAB COLLECTION DEVICE MRSA (MISCELLANEOUS) IMPLANT
TOWEL OR 17X24 6PK STRL BLUE (TOWEL DISPOSABLE) ×3 IMPLANT
TOWEL OR 17X26 10 PK STRL BLUE (TOWEL DISPOSABLE) ×3 IMPLANT
TUBE ANAEROBIC SPECIMEN COL (MISCELLANEOUS) IMPLANT
WATER STERILE IRR 1000ML POUR (IV SOLUTION) IMPLANT

## 2014-10-27 NOTE — Interval H&P Note (Signed)
History and Physical Interval Note:  10/27/2014 4:03 PM  Mallory Potts  has presented today for surgery, with the diagnosis of Infectiom Right Foot  The various methods of treatment have been discussed with the patient and family. After consideration of risks, benefits and other options for treatment, the patient has consented to  Procedure(s): RIGHT 2ND RAY AMPUTATION VS TRANSMETATARSAL OR BKA (Right) as a surgical intervention .  The patient's history has been reviewed, patient examined, no change in status, stable for surgery.  I have reviewed the patient's chart and labs.  Questions were answered to the patient's satisfaction.    The risks and benefits of the alternative treatment options have been discussed in detail.  The patient wishes to proceed with surgery and specifically understands risks of bleeding, infection, nerve damage, blood clots, need for additional surgery, amputation and death.    [name]

## 2014-10-27 NOTE — Progress Notes (Addendum)
[facility] Progress Note   Mallory Potts RA:[id number] DOB: 03-19-1966 DOA: 10/23/2014 PCP: Arnoldo Morale, MD  Brief narrative: Mallory Potts is a 49 y.o. female with insulin-dependent diabetes mellitus, COPD, hypertension presented with right foot pain and swelling having failed outpatient antibiotics.   Approximately 2 weeks ago she hit her second toe and broke the distal portion of the toe. Since then she's been having progressive  pain and swelling. Swelling has now progressed up to her foot. She was seen in ED on 5/24. She was diagnosed with cellulitis and placed on oral clindamycin but continued to have increasing swelling despite taking this medication appropriately.  Subjective: Having pain in her foot. No complaints of cough, shortness of breath, nausea, vomiting, abdominal pain or diarrhea.  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  Active Problems:   DM (diabetes mellitus)-type II-uncontrolled -A1c on 6/4 was 11.1 -She takes Lantus at 15 mg daily per her med Rec-only on 20 mg of Lantus with continuously elevated sugars -We'll increase Lantus to 25 mg for this evenings dose -Continue high-dose sliding scale with mealtime insulin - Janumet on hold    Epilepsy -continue Tegretol    COPD (chronic obstructive pulmonary disease) -Currently stable --continue albuterol when necessary and Spiriva     Bipolar 1 disorder -Continue trazodone    hypothyroidism  -cont Synthroid  Anemia - of chronic disease? - check anemia panel  Morbid obesity Body mass index is 38.27 kg/(m^2).    Code Status: full code Family Communication:  Disposition Plan:  DVT  prophylaxis: Heparin Consultants:ortho Procedures:  Antibiotics: Anti-infectives    Start     Dose/Rate Route Frequency Ordered Stop   10/24/14 1800  vancomycin (VANCOCIN) IVPB 750 mg/150 ml premix     750 mg 150 mL/hr over 60 Minutes Intravenous Every 12 hours 10/24/14 0410     10/24/14 1000  clindamycin (CLEOCIN) capsule 450 mg  Status:  Discontinued     450 mg Oral 3 times daily 10/24/14 0346 10/24/14 0401   10/24/14 0800  piperacillin-tazobactam (ZOSYN) IVPB 3.375 g     3.375 g 12.5 mL/hr over 240 Minutes Intravenous Every 8 hours 10/24/14 0356     10/24/14 0400  vancomycin (VANCOCIN) 2,000 mg in sodium chloride 0.9 % 500 mL IVPB  Status:  Discontinued     2,000 mg 250 mL/hr over 120 Minutes Intravenous  Once 10/24/14 0355 10/24/14 1300   10/24/14 0130  vancomycin (VANCOCIN) 2,000 mg in sodium chloride 0.9 % 500 mL IVPB  Status:  Discontinued     2,000 mg 250 mL/hr over 120 Minutes Intravenous  Once 10/24/14 0121 10/24/14 0625   10/24/14 0015  piperacillin-tazobactam (ZOSYN) IVPB 3.375 g     3.375 g 12.5 mL/hr over 240 Minutes Intravenous  Once 10/24/14 0008 10/24/14 0217      Objective: Filed Weights   10/24/14 0229  Weight: 104.327 kg (230 lb)    Intake/Output Summary (Last 24 hours) at 10/27/14 1058 Last data filed at 10/26/14 1653  Gross per 24 hour  Intake    240 ml  Output      0 ml  Net    240 ml     Vitals Filed Vitals:   10/26/14  1700 10/26/14 2122 10/27/14 0535 10/27/14 0840  BP: 113/66 125/69 132/61   Pulse: 73 71 79   Temp: 98.5 F (36.9 C) 98.9 F (37.2 C) 98.7 F (37.1 C)   TempSrc: Oral Oral    Resp: 16 17 18    Height:      Weight:      SpO2: 99% 97% 94% 98%    Exam:  General:  Pt is alert, not in acute distress  HEENT: No icterus, No thrush, oral mucosa moist  Cardiovascular: regular rate and rhythm, S1/S2 No murmur  Respiratory: clear to auscultation bilaterally   Abdomen: Soft, +Bowel sounds, non tender, non distended, no  guarding  MSK: No LE edema, cyanosis or clubbing- right foot and leg in dressing  Data Reviewed: Basic Metabolic Panel:  Recent Labs Lab 10/23/14 2040 10/24/14 0501 10/25/14 0302 10/26/14 0445  NA 122* 126* 129* 133*  K 3.2* 3.1* 3.8 3.4*  CL 88* 88* 94* 96*  CO2 24 28 26 28   GLUCOSE 401* 362* 287* 239*  BUN 15 14 13 14   CREATININE 1.09* 0.95 1.11* 0.94  CALCIUM 7.9* 7.9* 7.7* 8.2*   Liver Function Tests:  Recent Labs Lab 10/23/14 2040  AST 19  ALT 16  ALKPHOS 63  BILITOT 0.2*  PROT 8.3*  ALBUMIN 2.4*   No results for input(s): LIPASE, AMYLASE in the last 168 hours. No results for input(s): AMMONIA in the last 168 hours. CBC:  Recent Labs Lab 10/23/14 2040 10/24/14 0501 10/25/14 0918 10/26/14 0445  WBC 20.0* 15.9* 12.5* 11.2*  NEUTROABS 17.7*  --  10.5*  --   HGB 9.2* 8.1* 8.9* 9.3*  HCT 27.6* 24.6* 26.5* 27.9*  MCV 91.7 89.8 89.2 90.0  PLT 270 242 217 230   Cardiac Enzymes: No results for input(s): CKTOTAL, CKMB, CKMBINDEX, TROPONINI in the last 168 hours. BNP (last 3 results) No results for input(s): BNP in the last 8760 hours.  ProBNP (last 3 results) No results for input(s): PROBNP in the last 8760 hours.  CBG:  Recent Labs Lab 10/26/14 0743 10/26/14 1235 10/26/14 1816 10/26/14 2239 10/27/14 0634  GLUCAP 290* 219* 189* 236* 204*    Recent Results (from the past 240 hour(s))  Culture, blood (routine x 2)     Status: None (Preliminary result)   Collection Time: 10/24/14  9:07 AM  Result Value Ref Range Status   Specimen Description BLOOD RIGHT ARM  Final   Special Requests BOTTLES DRAWN AEROBIC ONLY 10CC AEROBIC ONLY   Final   Culture   Final           BLOOD CULTURE RECEIVED NO GROWTH TO DATE CULTURE WILL BE HELD FOR 5 DAYS BEFORE ISSUING A FINAL NEGATIVE REPORT Performed at [facility]    Report Status PENDING  Incomplete  Culture, blood (routine x 2)     Status: None (Preliminary result)   Collection Time: 10/24/14  9:12  AM  Result Value Ref Range Status   Specimen Description BLOOD RIGHT HAND  Final   Special Requests BOTTLES DRAWN AEROBIC ONLY 5CC AEROBIC ONLY   Final   Culture   Final           BLOOD CULTURE RECEIVED NO GROWTH TO DATE CULTURE WILL BE HELD FOR 5 DAYS BEFORE ISSUING A FINAL NEGATIVE REPORT Performed at [facility]    Report Status PENDING  Incomplete  Surgical pcr screen     Status: None   Collection Time: 10/24/14 10:21 AM  Result Value  Ref Range Status   MRSA, PCR NEGATIVE NEGATIVE Final   Staphylococcus aureus NEGATIVE NEGATIVE Final    Comment:        The Xpert SA Assay (FDA approved for NASAL specimens in patients over 66 years of age), is one component of a comprehensive surveillance program.  Test performance has been validated by [facility] for patients greater than or equal to 49 year old. It is not intended to diagnose infection nor to guide or monitor treatment.   Wound culture     Status: None   Collection Time: 10/24/14 10:27 AM  Result Value Ref Range Status   Specimen Description ABSCESS  Final   Special Requests NONE  Final   Gram Stain   Final    MODERATE WBC PRESENT, PREDOMINANTLY PMN RARE SQUAMOUS EPITHELIAL CELLS PRESENT ABUNDANT GRAM POSITIVE COCCI IN PAIRS Performed at [facility]    Culture   Final    ABUNDANT ENTEROCOCCUS SPECIES Performed at [facility]    Report Status 10/27/2014 FINAL  Final   Organism ID, Bacteria ENTEROCOCCUS SPECIES  Final      Susceptibility   Enterococcus species - MIC*    VANCOMYCIN 1 SENSITIVE Sensitive     AMPICILLIN <=2 SENSITIVE Sensitive     * ABUNDANT ENTEROCOCCUS SPECIES  Anaerobic culture     Status: None (Preliminary result)   Collection Time: 10/24/14  1:45 PM  Result Value Ref Range Status   Specimen Description TOE RIGHT ABSCESS  Final   Special Requests NONE  Final   Gram Stain PENDING  Incomplete   Culture   Final    NO ANAEROBES ISOLATED; CULTURE IN PROGRESS FOR 5  DAYS Performed at [facility]    Report Status PENDING  Incomplete  Culture, routine-abscess     Status: None   Collection Time: 10/24/14  1:45 PM  Result Value Ref Range Status   Specimen Description TOE RIGHT ABSCESS  Final   Special Requests NONE  Final   Gram Stain   Final    ABUNDANT WBC PRESENT, PREDOMINANTLY PMN NO SQUAMOUS EPITHELIAL CELLS SEEN ABUNDANT GRAM POSITIVE COCCI IN PAIRS Performed at [facility]    Culture   Final    MODERATE ENTEROCOCCUS SPECIES Performed at [facility]    Report Status 10/27/2014 FINAL  Final   Organism ID, Bacteria ENTEROCOCCUS SPECIES  Final      Susceptibility   Enterococcus species - MIC*    VANCOMYCIN 1 SENSITIVE Sensitive     AMPICILLIN <=2 SENSITIVE Sensitive     * MODERATE ENTEROCOCCUS SPECIES     Studies: No results found.  Scheduled Meds:  Scheduled Meds: . sodium chloride   Intravenous Once  . antiseptic oral rinse  7 mL Mouth Rinse BID  . carbamazepine  200 mg Oral Daily  . carbamazepine  400 mg Oral QHS  . chlorthalidone  25 mg Oral Daily  . fluticasone  1 spray Each Nare Daily  . gabapentin  100 mg Oral BID  . insulin aspart  0-20 Units Subcutaneous TID WC  . insulin aspart  0-5 Units Subcutaneous QHS  . insulin aspart  4 Units Subcutaneous TID WC  . insulin glargine  20 Units Subcutaneous QHS  . levothyroxine  50 mcg Oral QAC breakfast  . meloxicam  15 mg Oral Daily  . pantoprazole  40 mg Oral Daily  . piperacillin-tazobactam (ZOSYN)  IV  3.375 g Intravenous Q8H  . sodium chloride  10-40 mL Intracatheter Q12H  .  tiotropium  18 mcg Inhalation Daily  . traZODone  50 mg Oral QHS  . vancomycin  750 mg Intravenous Q12H   Continuous Infusions: . sodium chloride 10 mL/hr at 10/24/14 1748    Time spent on care of this patient: 35 min   [place], MD 10/27/2014, 10:58 AM  LOS: 3 days   [facility] Office  [phone] Pager - Text Page per [email or web address] If 7PM-7AM, please  contact night-coverage [email or web address]

## 2014-10-27 NOTE — Progress Notes (Signed)
ANTIBIOTIC CONSULT NOTE - FOLLOW UP  Pharmacy Consult for Vancomycin/Zosyn Indication: Cellulitis of LLE  Allergies  Allergen Reactions  . Lithium Shortness Of Breath  . Milk-Related Compounds Nausea And Vomiting  . Other     Bell peppers. Mouth sores.    . Abilify [Aripiprazole] Rash  . Aripiprazole Rash    Patient Measurements: Height: 5\' 5"  (165.1 cm) Weight: 230 lb (104.327 kg) IBW/kg (Calculated) : 57   Vital Signs: Temp: 98.7 F (37.1 C) (06/07 0535) Temp Source: Oral (06/06 2122) BP: 132/61 mmHg (06/07 0535) Pulse Rate: 79 (06/07 0535) Intake/Output from previous day: 06/06 0701 - 06/07 0700 In: 250 [P.O.:240; I.V.:10] Out: -  Intake/Output from this shift:    Labs:  Recent Labs  10/25/14 0302 10/25/14 0918 10/26/14 0445  WBC  --  12.5* 11.2*  HGB  --  8.9* 9.3*  PLT  --  217 230  CREATININE 1.11*  --  0.94   Estimated Creatinine Clearance: 87.7 mL/min (by C-G formula based on Cr of 0.94). No results for input(s): VANCOTROUGH, VANCOPEAK, VANCORANDOM, GENTTROUGH, GENTPEAK, GENTRANDOM, TOBRATROUGH, TOBRAPEAK, TOBRARND, AMIKACINPEAK, AMIKACINTROU, AMIKACIN in the last 72 hours.   Microbiology: Recent Results (from the past 720 hour(s))  Culture, blood (routine x 2)     Status: None (Preliminary result)   Collection Time: 10/24/14  9:07 AM  Result Value Ref Range Status   Specimen Description BLOOD RIGHT ARM  Final   Special Requests BOTTLES DRAWN AEROBIC ONLY 10CC AEROBIC ONLY   Final   Culture   Final           BLOOD CULTURE RECEIVED NO GROWTH TO DATE CULTURE WILL BE HELD FOR 5 DAYS BEFORE ISSUING A FINAL NEGATIVE REPORT Performed at [facility]    Report Status PENDING  Incomplete  Culture, blood (routine x 2)     Status: None (Preliminary result)   Collection Time: 10/24/14  9:12 AM  Result Value Ref Range Status   Specimen Description BLOOD RIGHT HAND  Final   Special Requests BOTTLES DRAWN AEROBIC ONLY 5CC AEROBIC ONLY   Final    Culture   Final           BLOOD CULTURE RECEIVED NO GROWTH TO DATE CULTURE WILL BE HELD FOR 5 DAYS BEFORE ISSUING A FINAL NEGATIVE REPORT Performed at [facility]    Report Status PENDING  Incomplete  Surgical pcr screen     Status: None   Collection Time: 10/24/14 10:21 AM  Result Value Ref Range Status   MRSA, PCR NEGATIVE NEGATIVE Final   Staphylococcus aureus NEGATIVE NEGATIVE Final    Comment:        The Xpert SA Assay (FDA approved for NASAL specimens in patients over 27 years of age), is one component of a comprehensive surveillance program.  Test performance has been validated by [facility] for patients greater than or equal to 11 year old. It is not intended to diagnose infection nor to guide or monitor treatment.   Wound culture     Status: None   Collection Time: 10/24/14 10:27 AM  Result Value Ref Range Status   Specimen Description ABSCESS  Final   Special Requests NONE  Final   Gram Stain   Final    MODERATE WBC PRESENT, PREDOMINANTLY PMN RARE SQUAMOUS EPITHELIAL CELLS PRESENT ABUNDANT GRAM POSITIVE COCCI IN PAIRS Performed at [facility]    Culture   Final    ABUNDANT ENTEROCOCCUS SPECIES Performed at [facility]  Report Status 10/27/2014 FINAL  Final   Organism ID, Bacteria ENTEROCOCCUS SPECIES  Final      Susceptibility   Enterococcus species - MIC*    VANCOMYCIN 1 SENSITIVE Sensitive     AMPICILLIN <=2 SENSITIVE Sensitive     * ABUNDANT ENTEROCOCCUS SPECIES  Anaerobic culture     Status: None (Preliminary result)   Collection Time: 10/24/14  1:45 PM  Result Value Ref Range Status   Specimen Description TOE RIGHT ABSCESS  Final   Special Requests NONE  Final   Gram Stain PENDING  Incomplete   Culture   Final    NO ANAEROBES ISOLATED; CULTURE IN PROGRESS FOR 5 DAYS Performed at [facility]    Report Status PENDING  Incomplete  Culture, routine-abscess     Status: None   Collection Time: 10/24/14  1:45 PM   Result Value Ref Range Status   Specimen Description TOE RIGHT ABSCESS  Final   Special Requests NONE  Final   Gram Stain   Final    ABUNDANT WBC PRESENT, PREDOMINANTLY PMN NO SQUAMOUS EPITHELIAL CELLS SEEN ABUNDANT GRAM POSITIVE COCCI IN PAIRS Performed at [facility]    Culture   Final    MODERATE ENTEROCOCCUS SPECIES Performed at [facility]    Report Status 10/27/2014 FINAL  Final   Organism ID, Bacteria ENTEROCOCCUS SPECIES  Final      Susceptibility   Enterococcus species - MIC*    VANCOMYCIN 1 SENSITIVE Sensitive     AMPICILLIN <=2 SENSITIVE Sensitive     * MODERATE ENTEROCOCCUS SPECIES    Medical History: Past Medical History  Diagnosis Date  . Diabetes mellitus   . Asthma   . Arthritis   . Hypertension   . Epilepsy   . Seizures   . Thyroid disease   . COPD (chronic obstructive pulmonary disease)   . Bipolar 1 disorder   . Peripheral edema   . OCD (obsessive compulsive disorder)   . COPD (chronic obstructive pulmonary disease)   . Depression   . Anxiety     Medications:  Prescriptions prior to admission  Medication Sig Dispense Refill Last Dose  . acetaminophen (TYLENOL) 500 MG tablet Take 1,000 mg by mouth every 6 (six) hours as needed for mild pain, moderate pain or headache.   Past Month at Unknown time  . albuterol (PROVENTIL HFA;VENTOLIN HFA) 108 (90 BASE) MCG/ACT inhaler Inhale 2 puffs into the lungs every 6 (six) hours as needed for wheezing. Breathing   10/23/2014 at Unknown time  . albuterol (PROVENTIL) (2.5 MG/3ML) 0.083% nebulizer solution Take 2.5 mg by nebulization 2 times daily at 12 noon and 4 pm.   Past Month at Unknown time  . Aspirin-Acetaminophen-Caffeine (ANACIN ADVANCED HEADACHE PO) Take 1 tablet by mouth every 8 (eight) hours as needed (pain).   Past Week at Unknown time  . Aspirin-Caffeine 400-32 MG TABS Take 2 tablets by mouth daily as needed (migraine.).   Past Week at Unknown time  . carbamazepine (TEGRETOL) 200 MG  tablet Take 200-400 mg by mouth 2 (two) times daily. Take 200 mg every morning and 400 mg at bedtime.   10/22/2014 at Unknown time  . cetirizine (ZYRTEC) 10 MG tablet Take 10 mg by mouth daily.   10/23/2014 at Unknown time  . chlorthalidone (HYGROTON) 25 MG tablet Take 25 mg by mouth daily.   Past Month at Unknown time  . clindamycin (CLEOCIN) 150 MG capsule Take 3 capsules (450 mg total) by mouth 3 (  three) times daily. Please take full course 90 capsule 0 10/23/2014 at Unknown time  . diphenhydrAMINE (BENADRYL) 25 mg capsule Take 25 mg by mouth every 8 (eight) hours as needed for itching.   Past Week at Unknown time  . fluticasone (FLONASE) 50 MCG/ACT nasal spray Place 1 spray into both nostrils daily.   10/22/2014 at Unknown time  . furosemide (LASIX) 40 MG tablet Take 40 mg by mouth daily.   10/23/2014 at Unknown time  . gabapentin (NEURONTIN) 100 MG capsule Take 100 mg by mouth 2 (two) times daily.   10/23/2014 at Unknown time  . glipiZIDE (GLUCOTROL) 5 MG tablet Take 5 mg by mouth 2 (two) times daily before a meal.    10/23/2014 at Unknown time  . insulin glargine (LANTUS) 100 UNIT/ML injection Inject 15 Units into the skin at bedtime.   Past Month at Unknown time  . levothyroxine (SYNTHROID, LEVOTHROID) 50 MCG tablet Take 50 mcg by mouth daily before breakfast.   10/23/2014 at Unknown time  . meloxicam (MOBIC) 15 MG tablet Take 15 mg by mouth daily.   10/23/2014 at Unknown time  . Omega-3 Fatty Acids (FISH OIL) 1000 MG CAPS Take 1,000 mg by mouth 2 (two) times daily.   10/23/2014 at Unknown time  . omeprazole (PRILOSEC) 20 MG capsule Take 20 mg by mouth daily.   Past Month at Unknown time  . Oxycodone HCl 10 MG TABS Take 10 mg by mouth once.   10/23/2014 at Unknown time  . sitaGLIPtin-metformin (JANUMET) 50-1000 MG per tablet Take 1 tablet by mouth 2 (two) times daily with a meal.   10/23/2014 at Unknown time  . tiotropium (SPIRIVA) 18 MCG inhalation capsule Place 18 mcg into inhaler and inhale daily.   10/23/2014 at  Unknown time  . traZODone (DESYREL) 50 MG tablet Take 50 mg by mouth at bedtime.   Past Week at Unknown time  . albuterol (PROVENTIL) (5 MG/ML) 0.5% nebulizer solution Take 5 mg by nebulization every 4 (four) hours as needed. Breathing     . HYDROcodone-acetaminophen (NORCO) 5-325 MG per tablet Take 1 tablet by mouth every 6 (six) hours as needed. (Patient not taking: Reported on 10/23/2014) 10 tablet 0 Completed Course at Unknown time   Scheduled:  . sodium chloride   Intravenous Once  . antiseptic oral rinse  7 mL Mouth Rinse BID  . carbamazepine  200 mg Oral Daily  . carbamazepine  400 mg Oral QHS  . chlorthalidone  25 mg Oral Daily  . fluticasone  1 spray Each Nare Daily  . gabapentin  100 mg Oral BID  . insulin aspart  0-20 Units Subcutaneous TID WC  . insulin aspart  0-5 Units Subcutaneous QHS  . insulin aspart  4 Units Subcutaneous TID WC  . insulin glargine  20 Units Subcutaneous QHS  . levothyroxine  50 mcg Oral QAC breakfast  . meloxicam  15 mg Oral Daily  . pantoprazole  40 mg Oral Daily  . piperacillin-tazobactam (ZOSYN)  IV  3.375 g Intravenous Q8H  . sodium chloride  10-40 mL Intracatheter Q12H  . tiotropium  18 mcg Inhalation Daily  . traZODone  50 mg Oral QHS  . vancomycin  750 mg Intravenous Q12H   Infusions:  . sodium chloride 10 mL/hr at 10/24/14 1748   Assessment: 51 yoF presented with worsening cellultis of right foot after breaking toe 2 weeks ago. Currently on IV Vancomycin/Zosyn for cellulitis / osteo of RLE s/p 2nd toe amputation and forefoot  I&D. Scheduled for possible BKA today. Wound cultures grew enterococcus sensitive to ampicillin.   Goal of Therapy:  Vancomycin trough level 10-15 mcg/ml  Plan:   Continue Vancomycin 750mg  IV q12h. Consider switching to IV ampicillin s/p surgery today  Zosyn 3.375 Gm IV q8h EI  F/u scr/cultures/levels  Albertina Parr, PharmD., BCPS Clinical Pharmacist Pager [phone]

## 2014-10-27 NOTE — Anesthesia Preprocedure Evaluation (Addendum)
Anesthesia Evaluation  Patient identified by MRN, date of birth, ID band Patient awake    Reviewed: Allergy & Precautions, NPO status , Patient's Chart, lab work & pertinent test results  History of Anesthesia Complications Negative for: history of anesthetic complications  Airway Mallampati: II  TM Distance: >3 FB Neck ROM: Full    Dental  (+) Edentulous Upper, Edentulous Lower   Pulmonary COPD COPD inhaler, Recent URI , Residual Cough, Current Smoker,  breath sounds clear to auscultation        Cardiovascular hypertension, Pt. on medications - anginaRhythm:Regular Rate:Normal     Neuro/Psych Seizures -, Well Controlled,  Anxiety Depression Bipolar Disorder    GI/Hepatic Neg liver ROS, GERD-  Medicated and Poorly Controlled,  Endo/Other  diabetes (glu 119), Oral Hypoglycemic Agents, Insulin DependentMorbid obesity  Renal/GU negative Renal ROS     Musculoskeletal  (+) Arthritis -,   Abdominal (+) + obese,   Peds  Hematology  (+) Blood dyscrasia (Hb 9.3), ,   Anesthesia Other Findings   Reproductive/Obstetrics                            Anesthesia Physical Anesthesia Plan  ASA: III  Anesthesia Plan: General   Post-op Pain Management:    Induction: Intravenous  Airway Management Planned: Oral ETT  Additional Equipment:   Intra-op Plan:   Post-operative Plan: Extubation in OR  Informed Consent: I have reviewed the patients History and Physical, chart, labs and discussed the procedure including the risks, benefits and alternatives for the proposed anesthesia with the patient or authorized representative who has indicated his/her understanding and acceptance.     Plan Discussed with: CRNA and Surgeon  Anesthesia Plan Comments: (Plan routine monitors, GETA)        Anesthesia Quick Evaluation

## 2014-10-27 NOTE — Transfer of Care (Signed)
Immediate Anesthesia Transfer of Care Note  Patient: Mallory Potts  Procedure(s) Performed: Procedure(s): RIGHT BELOW KNEE AMPUTATION (Right)  Patient Location: PACU  Anesthesia Type:General  Level of Consciousness: awake, oriented, sedated, patient cooperative and responds to stimulation  Airway & Oxygen Therapy: Patient Spontanous Breathing and Patient connected to nasal cannula oxygen  Post-op Assessment: Report given to RN, Post -op Vital signs reviewed and stable, Patient moving all extremities and Patient moving all extremities X 4  Post vital signs: Reviewed and stable  Last Vitals:  Filed Vitals:   10/27/14 1350  BP: 100/55  Pulse: 63  Temp: 37.1 C  Resp: 18    Complications: No apparent anesthesia complications

## 2014-10-27 NOTE — Brief Op Note (Signed)
10/23/2014 - 10/27/2014  6:02 PM  PATIENT:  Mallory Potts  49 y.o. female  PRE-OPERATIVE DIAGNOSIS:  Right foot gangrene s/p 2nd toe amp and I and D  POST-OPERATIVE DIAGNOSIS:  same  Procedure(s):  RIGHT BELOW KNEE AMPUTATION  SURGEON:  [name], MD  ASSISTANT:  Mechele Claude, PA-C  ANESTHESIA:   General  EBL:  minimal   TOURNIQUET:   Total Tourniquet Time Documented: Thigh (Right) - 45 minutes Total: Thigh (Right) - 45 minutes  COMPLICATIONS:  None apparent  DISPOSITION:  Extubated, awake and stable to recovery.  DICTATION ID:  [id number]

## 2014-10-27 NOTE — Anesthesia Procedure Notes (Signed)
Procedure Name: Intubation Date/Time: 10/27/2014 4:21 PM Performed by: Jacquiline Doe A Pre-anesthesia Checklist: Patient identified, Timeout performed, Emergency Drugs available, Suction available and Patient being monitored Patient Re-evaluated:Patient Re-evaluated prior to inductionOxygen Delivery Method: Circle system utilized Preoxygenation: Pre-oxygenation with 100% oxygen Intubation Type: IV induction, Rapid sequence and Cricoid Pressure applied Laryngoscope Size: Mac and 4 Grade View: Grade I Tube type: Oral Tube size: 7.5 mm Number of attempts: 1 Airway Equipment and Method: Stylet Placement Confirmation: ETT inserted through vocal cords under direct vision,  breath sounds checked- equal and bilateral and positive ETCO2 Secured at: 22 cm Tube secured with: Tape Dental Injury: Teeth and Oropharynx as per pre-operative assessment

## 2014-10-28 ENCOUNTER — Encounter (HOSPITAL_COMMUNITY): Payer: Self-pay | Admitting: Orthopedic Surgery

## 2014-10-28 DIAGNOSIS — E1165 Type 2 diabetes mellitus with hyperglycemia: Secondary | ICD-10-CM

## 2014-10-28 DIAGNOSIS — L03119 Cellulitis of unspecified part of limb: Secondary | ICD-10-CM

## 2014-10-28 DIAGNOSIS — F319 Bipolar disorder, unspecified: Secondary | ICD-10-CM

## 2014-10-28 DIAGNOSIS — E871 Hypo-osmolality and hyponatremia: Secondary | ICD-10-CM

## 2014-10-28 LAB — GLUCOSE, CAPILLARY
GLUCOSE-CAPILLARY: 183 mg/dL — AB (ref 65–99)
GLUCOSE-CAPILLARY: 185 mg/dL — AB (ref 65–99)
Glucose-Capillary: 231 mg/dL — ABNORMAL HIGH (ref 65–99)
Glucose-Capillary: 213 mg/dL — ABNORMAL HIGH (ref 65–99)

## 2014-10-28 LAB — BASIC METABOLIC PANEL
Anion gap: 10 (ref 5–15)
BUN: 15 mg/dL (ref 6–20)
CHLORIDE: 95 mmol/L — AB (ref 101–111)
CO2: 27 mmol/L (ref 22–32)
CREATININE: 1.99 mg/dL — AB (ref 0.44–1.00)
Calcium: 8.1 mg/dL — ABNORMAL LOW (ref 8.9–10.3)
GFR calc Af Amer: 33 mL/min — ABNORMAL LOW (ref >60)
GFR calc non Af Amer: 29 mL/min — ABNORMAL LOW (ref >60)
GLUCOSE: 215 mg/dL — AB (ref 65–99)
Potassium: 3.3 mmol/L — ABNORMAL LOW (ref 3.5–5.1)
SODIUM: 132 mmol/L — AB (ref 135–145)

## 2014-10-28 LAB — IRON AND TIBC
Iron: 25 ug/dL — ABNORMAL LOW (ref 28–170)
SATURATION RATIOS: 16 % (ref 10.4–31.8)
TIBC: 154 ug/dL — ABNORMAL LOW (ref 250–450)
UIBC: 129 ug/dL

## 2014-10-28 LAB — RETICULOCYTES
RBC.: 3.09 MIL/uL — AB (ref 3.87–5.11)
RETIC COUNT ABSOLUTE: 24.7 10*3/uL (ref 19.0–186.0)
Retic Ct Pct: 0.8 % (ref 0.4–3.1)

## 2014-10-28 LAB — FERRITIN: Ferritin: 356 ng/mL — ABNORMAL HIGH (ref 11–307)

## 2014-10-28 LAB — FOLATE: Folate: 12.5 ng/mL (ref >5.9)

## 2014-10-28 LAB — VITAMIN B12: Vitamin B-12: 907 pg/mL (ref 180–914)

## 2014-10-28 MED ORDER — FLUCONAZOLE 100 MG PO TABS
100.0000 mg | ORAL_TABLET | Freq: Every day | ORAL | Status: AC
  Administered 2014-10-28 – 2014-10-30 (×3): 100 mg via ORAL
  Filled 2014-10-28 (×3): qty 1

## 2014-10-28 MED ORDER — SODIUM CHLORIDE 0.9 % IV SOLN
INTRAVENOUS | Status: DC
  Administered 2014-10-28 – 2014-10-29 (×3): via INTRAVENOUS

## 2014-10-28 MED ORDER — MORPHINE SULFATE 2 MG/ML IJ SOLN: 2.0000 mg | INTRAMUSCULAR | Status: DC | PRN

## 2014-10-28 MED ORDER — SODIUM CHLORIDE 0.9 % IJ SOLN
10.0000 mL | INTRAMUSCULAR | Status: DC | PRN
  Administered 2014-10-28 – 2014-10-30 (×2): 10 mL
  Filled 2014-10-28: qty 40

## 2014-10-28 MED ORDER — FERROUS SULFATE 325 (65 FE) MG PO TABS
325.0000 mg | ORAL_TABLET | Freq: Two times a day (BID) | ORAL | Status: DC
Start: 2014-10-29 — End: 2014-10-30
  Administered 2014-10-29 – 2014-10-30 (×3): 325 mg via ORAL
  Filled 2014-10-28 (×3): qty 1

## 2014-10-28 NOTE — Op Note (Signed)
NAMESHASHAWNA, Mallory Potts NO.:  [id number]  MEDICAL RECORD NO.:  [id number]  LOCATION:  5N21C                        FACILITY:  [facility]  PHYSICIAN:  [name], MD        DATE OF BIRTH:  02/01/1966  DATE OF PROCEDURE:  10/27/2014 DATE OF DISCHARGE:                              OPERATIVE REPORT   PREOPERATIVE DIAGNOSIS:  Right foot gangrene status post second toe amputation and irrigation and debridement.  POSTOPERATIVE DIAGNOSIS:  Right foot gangrene status post second toe amputation and irrigation and debridement.  PROCEDURE:  Right below-knee amputation.  SURGEON:  [name], M.D.  ASSISTANT:  Mechele Claude, PA-C.  ANESTHESIA:  General.  ESTIMATED BLOOD LOSS:  Minimal.  TOURNIQUET TIME:  45 minutes at 250 mmHg.  COMPLICATIONS:  None apparent.  DISPOSITION:  Extubated, awake, and stable to recovery.  SPECIMEN:  Right lower extremity to Pathology.  INDICATIONS FOR PROCEDURE:  The patient is a 49 year old female with past medical history significant for smoking and diabetes.  She had an ulcer to the right foot a few weeks ago after stubbing her toe.  This spread from the second toe to the foot, leading to gangrenous forefoot. She underwent irrigation and debridement and amputation of the second toe over the weekend by Dr. Veverly Fells.  She presents now for a planned return to the operating room for repeat I and D and possible revision second ray amputation versus transmetatarsal amputation or possibly a below-knee amputation.  She understands the risks and benefits, the alternative treatment options, and elects surgical treatment.  She specifically understands risks of bleeding, infection, nerve damage, blood clots, need for additional surgery, continued pain, revision amputation, and death.  PROCEDURE IN DETAIL:  After preoperative consent was obtained and the correct operative site was identified, the patient was brought to the operating room and placed  supine on the operating table.  General anesthesia was induced.  Preoperative antibiotics were administered. Surgical time-out was taken.  The right lower extremity was then carefully examined.  The dorsal 2 incisions were opened with gross purulent drainage from both.  The tissue deep to this appeared necrotic. The plantar wound also had purulent drainage with evidence of necrotic muscle deep to that.  The extent of the infection was rather severe and involved the entire midfoot and forefoot with no salvageable soft tissues within that layer.  Based on that degree of infection, the decision was made to proceed with below-knee amputation.  The right lower extremity was then prepped and draped in standard sterile fashion with a tourniquet around the thigh.  The extremity was exsanguinated and the tourniquet was inflated to 250 mmHg.  A posterior flap incision was marked on the skin 15 cm distal from the medial joint line.  The incision was made and sharp dissection was carried down through skin, subcutaneous tissues circumferentially.  The periosteum was then elevated at the tibia.  Soft tissues were protected and a reciprocating saw was used to cut through the tibia approximately a cm from the skin incision.  The fibula was then dissected and cut approximately 2 cm proximal from the tibial cut.  The leg was then retracted  anteriorly and an amputation knife was used to cut through the posterior soft tissues, contouring the flap appropriately.  The cut surface of bone was smoothed and contoured with a rasp.  The wound was irrigated copiously, removing all bone debris.  All of the named blood vessels were doubly suture ligated with 2-0 silk ties.  All the named nerves were gently retracted and cut and allowed to retract into healthy soft tissue.  The lesser saphenous vein and saphenous vein were both cauterized.  The wound was again irrigated.  Imbricating sutures of #1 PDS were used to  repair the gastrocnemius fascia to the anterior tibia in subperiosteal fashion. Deep fascia was closed with simple sutures of #1 PDS.  Superficial subcutaneous tissues were approximated with inverted simple sutures of 3- 0 Monocryl.  Horizontal mattress sutures of 2-0 nylon were used to close the skin incision.  Sterile dressings were applied followed by a compression wrap and a knee immobilizer.  Tourniquet was released after application of the dressings at 45 minutes.  The patient was awakened from anesthesia and transported to the recovery room in stable condition.  FOLLOWUP PLAN:  The patient will be readmitted to [facility] under the care of [facility].  She will have Physical Therapy, Occupational Therapy, and Social Work consults.  She will resume Lovenox for DVT prophylaxis tomorrow.  We will plan 24 hours of antibiotics postoperatively and then plan to stop them since we have removed the infected soft tissue.     [name], MD     JH/MEDQ  D:  10/27/2014  T:  10/28/2014  Job:  [id number]

## 2014-10-28 NOTE — Progress Notes (Signed)
Occupational Therapy Evaluation Patient Details Name: Mallory Potts MRN: [id number] DOB: 03/08/1966 Today's Date: 10/28/2014    History of Present Illness Patient is a 49 y/o female s/p R BKA. PMH includes DM, HTN, seizures, COPD, depression, anxiety, bipolar 1 disorder.   Clinical Impression   Patient presenting with decreased dynamic standing balance and decreased independence with self-care & functional mobility secondary to recent R BKA.  Patient mod I PTA, but with history of multiple falls. Patient currently requires min assist for functional mobility/transfers and overall min assist with ADLs. Patient will benefit from acute OT to increase overall independence in the areas of ADLs, functional mobility, education on energy conservation, and overall safety in order to safely discharge home with HHOT and 24/7 supervision/assistance. Recommending w/c for functional mobility at this time.    Follow Up Recommendations  Home health OT;Supervision/Assistance - 24 hour    Equipment Recommendations  3 in 1 bedside comode;Tub/shower bench    Recommendations for Other Services  None at this time  Precautions / Restrictions Precautions Precautions: Fall Restrictions Weight Bearing Restrictions: Yes RLE Weight Bearing: Non weight bearing      Mobility Bed Mobility Overal bed mobility: Needs Assistance Bed Mobility: Supine to Sit Supine to sit: Min guard;HOB elevated General bed mobility comments: Able to pull up into long sitting. Increased time to swing around to side of bed due to pain. No physical assist needed.  Transfers Overall transfer level: Needs assistance Equipment used: Rolling walker (2 wheeled) Transfers: Sit to/from Stand Sit to Stand: Min assist General transfer comment: Min A to rise from EOB x1 with cues for hand placement/safety.  Uncontrolled descent into chair.    Balance Overall balance assessment: Needs assistance Sitting-balance support: Single extremity  supported;Feet supported Sitting balance-Leahy Scale: Fair Sitting balance - Comments: Posterior lean during there ex EOB. Postural control: Posterior lean Standing balance support: During functional activity;Bilateral upper extremity supported Standing balance-Leahy Scale: Poor Standing balance comment: Relient on RW for support.     ADL Overall ADL's : Needs assistance/impaired Eating/Feeding: Set up;Sitting;Supervision/ safety   Grooming: Set up;Supervision/safety;Sitting   Upper Body Bathing: Min guard;Sitting   Lower Body Bathing: Minimal assistance;Cueing for safety;Sit to/from stand   Upper Body Dressing : Min guard;Sitting   Lower Body Dressing: Cueing for safety;Sit to/from stand;Minimal assistance   Toilet Transfer: Minimal assistance;RW;Stand-pivot General ADL Comments: Pt able to reach LLE for LB ADLs. Pt will need a w/c for mobility throughout the house for safety as pt has a history of falls. Pt will also benefit from a tub transfer bench for tub/shower transfers. HHOT recommended due to pt lives with roommates and will be by herself occassionally.     Pertinent Vitals/Pain Pain Assessment: Faces Faces Pain Scale: Hurts whole lot Pain Location: right residual limb with movement Pain Descriptors / Indicators: Grimacing;Sore;Aching;Moaning Pain Intervention(s): Limited activity within patient's tolerance;Monitored during session;Repositioned     Hand Dominance Right   Extremity/Trunk Assessment Upper Extremity Assessment Upper Extremity Assessment: Overall WFL for tasks assessed   Lower Extremity Assessment Lower Extremity Assessment: Defer to PT evaluation RLE Deficits / Details: New BKA; Limited AROM knee flexion secondary to pain. KI donned. RLE: Unable to fully assess due to pain RLE Sensation: decreased light touch   Cervical / Trunk Assessment Cervical / Trunk Assessment: Normal   Communication Communication Communication: No difficulties    Cognition Arousal/Alertness: Awake/alert Behavior During Therapy: WFL for tasks assessed/performed Overall Cognitive Status: Within Functional Limits for tasks assessed  Home Living Family/patient expects to be discharged to:: Private residence Living Arrangements: Other relatives Available Help at Discharge: Friend(s);Available PRN/intermittently Type of Home: Apartment Home Access: Stairs to enter Entrance Stairs-Number of Steps: 3 +1 threshold Entrance Stairs-Rails: None Home Layout: One level     Bathroom Shower/Tub: Tub/shower unit;Curtain   Biochemist, clinical: Standard     Home Equipment: Environmental consultant - 2 wheels;Cane - single point    Prior Functioning/Environment Level of Independence: Independent with assistive device(s)  Comments: using cane for mobility, family reports "unstable" and "looses balance" with h/o falls and "falling into the wall" - 3-4 falls (hitting floor) within the past year    OT Diagnosis: Generalized weakness;Acute pain   OT Problem List: Decreased strength;Decreased range of motion;Decreased activity tolerance;Impaired balance (sitting and/or standing);Decreased safety awareness;Decreased knowledge of use of DME or AE;Decreased knowledge of precautions;Pain   OT Treatment/Interventions: Self-care/ADL training;Therapeutic exercise;Energy conservation;DME and/or AE instruction;Therapeutic activities;Patient/family education;Balance training    OT Goals(Current goals can be found in the care plan section) Acute Rehab OT Goals Patient Stated Goal: to go home to my dog OT Goal Formulation: With patient/family Time For Goal Achievement: 11/04/14 Potential to Achieve Goals: Good ADL Goals Pt Will Perform Grooming: with modified independence;sitting (unsupported) Pt Will Perform Upper Body Bathing: Independently;sitting (unsupported) Pt Will Perform Lower Body Bathing: with modified independence;sit to/from stand Pt Will Perform Upper Body  Dressing: Independently;sitting (unsupported) Pt Will Perform Lower Body Dressing: with modified independence;sit to/from stand Pt Will Transfer to Toilet: with modified independence;ambulating;bedside commode Pt Will Perform Toileting - Clothing Manipulation and hygiene: with modified independence;sit to/from stand Pt Will Perform Tub/Shower Transfer: Tub transfer;ambulating;tub bench;rolling walker;with supervision Pt/caregiver will Perform Home Exercise Program: Both right and left upper extremity;With Supervision;With theraband;With written HEP provided Additional ADL Goal #1: Pt will be educated on energy conservation techniques for safety with ADLs and functional mobility  OT Frequency: Min 2X/week   Barriers to D/C: Decreased caregiver support       Co-evaluation PT/OT/SLP Co-Evaluation/Treatment: Yes Reason for Co-Treatment: For patient/therapist safety PT goals addressed during session: Mobility/safety with mobility;Strengthening/ROM OT goals addressed during session: ADL's and self-care;Strengthening/ROM      End of Session Equipment Utilized During Treatment: Gait belt;Rolling walker  Activity Tolerance: Patient tolerated treatment well Patient left: in chair;with call bell/phone within reach;with family/visitor present   Time: 1207-1236 OT Time Calculation (min): 29 min Charges:  OT General Charges $OT Visit: 1 Procedure OT Evaluation $Initial OT Evaluation Tier I: 1 Procedure  Jamel Dunton , MS, OTR/L, CLT Pager: [phone]  10/28/2014, 1:35 PM

## 2014-10-28 NOTE — Progress Notes (Addendum)
[facility] Progress Note   [name] [id number] DOB: 03/10/1966 DOA: 10/23/2014 PCP: Arnoldo Morale, MD  Brief narrative: Mallory Potts is a 49 y.o. female with insulin-dependent diabetes mellitus, COPD, hypertension presented with right foot pain and swelling having failed outpatient antibiotics.   Approximately 2 weeks ago she hit her second toe and broke the distal portion of the toe. Since then she's been having progressive  pain and swelling. Swelling has now progressed up to her foot. She was seen in ED on 5/24. She was diagnosed with cellulitis and placed on oral clindamycin but continued to have increasing swelling despite taking this medication appropriately.  Subjective: Feeling better today.  Pain better.  Vaginal discharge , itching, white secretions.   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  -Underwent I and D for foot and amputation of right second toe on 6/4-orthopedic surgery recommending further amputation. Patient underwent  BKA 6-7.  Anemia - iron deficiency.  Will start Iron. She will need screening colonoscopy  AKI; mild hyponatremia. Hypokalemia.  Continue with IV fluids.  Cr mildly increase to 1.3 from 0.94 Repeat labs now.   Vaginal candidiasis;  Start fluconazole.     Epilepsy -continue Tegretol    COPD (chronic obstructive pulmonary disease) -Currently stable --continue albuterol when necessary and Spiriva     Bipolar 1 disorder -Continue trazodone    hypothyroidism  -cont Synthroid    DM (diabetes mellitus)-type II-uncontrolled -A1c on 6/4 was 11.1 -counseling provide to patient.  -Continue with lantus 25 units daily.  -Continue high-dose sliding scale with mealtime insulin - Janumet on hold  Morbid obesity Body mass index is 37.24 kg/(m^2).    Code Status: full code Family  Communication: care discussed with patient.  Disposition Plan: SW consulted for SNF referral.  DVT prophylaxis: Heparin Consultants:ortho Procedures:  Antibiotics: Anti-infectives    Start     Dose/Rate Route Frequency Ordered Stop   10/28/14 1430  fluconazole (DIFLUCAN) tablet 100 mg     100 mg Oral Daily 10/28/14 1428 10/31/14 0959   10/27/14 1600  vancomycin (VANCOCIN) 1 GM/200ML IVPB    Comments:  Mallory Potts  : cabinet override      10/27/14 1600 10/27/14 1650   10/24/14 1800  vancomycin (VANCOCIN) IVPB 750 mg/150 ml premix     750 mg 150 mL/hr over 60 Minutes Intravenous Every 12 hours 10/24/14 0410     10/24/14 1000  clindamycin (CLEOCIN) capsule 450 mg  Status:  Discontinued     450 mg Oral 3 times daily 10/24/14 0346 10/24/14 0401   10/24/14 0800  piperacillin-tazobactam (ZOSYN) IVPB 3.375 g     3.375 g 12.5 mL/hr over 240 Minutes Intravenous Every 8 hours 10/24/14 0356     10/24/14 0400  vancomycin (VANCOCIN) 2,000 mg in sodium chloride 0.9 % 500 mL IVPB  Status:  Discontinued     2,000 mg 250 mL/hr over 120 Minutes Intravenous  Once 10/24/14 0355 10/24/14 1300   10/24/14 0130  vancomycin (VANCOCIN) 2,000 mg in sodium chloride 0.9 % 500 mL IVPB  Status:  Discontinued     2,000 mg 250 mL/hr over 120 Minutes Intravenous  Once 10/24/14 0121 10/24/14 0625   10/24/14 0015  piperacillin-tazobactam (ZOSYN) IVPB 3.375 g     3.375 g 12.5 mL/hr over 240 Minutes Intravenous  Once 10/24/14 0008 10/24/14 0217      Objective: Mallory Potts  Weights   10/24/14 0229 10/28/14 0459  Weight: 104.327 kg (230 lb) 101.5 kg (223 lb 12.3 oz)    Intake/Output Summary (Last 24 hours) at 10/28/14 1552 Last data filed at 10/28/14 0800  Gross per 24 hour  Intake   2905 ml  Output      0 ml  Net   2905 ml     Vitals Filed Vitals:   10/28/14 0047 10/28/14 0459 10/28/14 0753 10/28/14 1259  BP: 137/70 139/66  130/63  Pulse: 68 65  65  Temp: 97.6 F (36.4 C) 97.8 F (36.6 C)  97.1 F  (36.2 C)  TempSrc:  Oral  Oral  Resp: 20 17  16   Height:      Weight:  101.5 kg (223 lb 12.3 oz)    SpO2: 98% 97% 98% 98%    Exam:  General:  NAD  Cardiovascular: regular rate and rhythm, S1/S2 No murmur  Respiratory: clear to auscultation bilaterally   Abdomen: Soft, +Bowel sounds, non tender, non distended, no guarding  MSK: No LE edema, cyanosis or clubbing. S/P AKA, with clean dressing.   Data Reviewed: Basic Metabolic Panel:  Recent Labs Lab 10/23/14 2040 10/24/14 0501 10/25/14 0302 10/26/14 0445 10/27/14 2205  NA 122* 126* 129* 133*  --   K 3.2* 3.1* 3.8 3.4*  --   CL 88* 88* 94* 96*  --   CO2 24 28 26 28   --   GLUCOSE 401* 362* 287* 239*  --   BUN 15 14 13 14   --   CREATININE 1.09* 0.95 1.11* 0.94 1.35*  CALCIUM 7.9* 7.9* 7.7* 8.2*  --    Liver Function Tests:  Recent Labs Lab 10/23/14 2040  AST 19  ALT 16  ALKPHOS 63  BILITOT 0.2*  PROT 8.3*  ALBUMIN 2.4*   No results for input(s): LIPASE, AMYLASE in the last 168 hours. No results for input(s): AMMONIA in the last 168 hours. CBC:  Recent Labs Lab 10/23/14 2040 10/24/14 0501 10/25/14 0918 10/26/14 0445 10/27/14 2205  WBC 20.0* 15.9* 12.5* 11.2* 9.8  NEUTROABS 17.7*  --  10.5*  --   --   HGB 9.2* 8.1* 8.9* 9.3* 9.6*  HCT 27.6* 24.6* 26.5* 27.9* 28.6*  MCV 91.7 89.8 89.2 90.0 89.9  PLT 270 242 217 230 234   Cardiac Enzymes: No results for input(s): CKTOTAL, CKMB, CKMBINDEX, TROPONINI in the last 168 hours. BNP (last 3 results) No results for input(s): BNP in the last 8760 hours.  ProBNP (last 3 results) No results for input(s): PROBNP in the last 8760 hours.  CBG:  Recent Labs Lab 10/27/14 1446 10/27/14 1749 10/27/14 2147 10/28/14 0644 10/28/14 1313  GLUCAP 119* 142* 262* 183* 185*    Recent Results (from the past 240 hour(s))  Culture, blood (routine x 2)     Status: None (Preliminary result)   Collection Time: 10/24/14  9:07 AM  Result Value Ref Range Status    Specimen Description BLOOD RIGHT ARM  Final   Special Requests BOTTLES DRAWN AEROBIC ONLY 10CC AEROBIC ONLY   Final   Culture   Final           BLOOD CULTURE RECEIVED NO GROWTH TO DATE CULTURE WILL BE HELD FOR 5 DAYS BEFORE ISSUING A FINAL NEGATIVE REPORT Performed at [facility]    Report Status PENDING  Incomplete  Culture, blood (routine x 2)     Status: None (Preliminary result)   Collection Time: 10/24/14  9:12 AM  Result  Value Ref Range Status   Specimen Description BLOOD RIGHT HAND  Final   Special Requests BOTTLES DRAWN AEROBIC ONLY 5CC AEROBIC ONLY   Final   Culture   Final           BLOOD CULTURE RECEIVED NO GROWTH TO DATE CULTURE WILL BE HELD FOR 5 DAYS BEFORE ISSUING A FINAL NEGATIVE REPORT Performed at [facility]    Report Status PENDING  Incomplete  Surgical pcr screen     Status: None   Collection Time: 10/24/14 10:21 AM  Result Value Ref Range Status   MRSA, PCR NEGATIVE NEGATIVE Final   Staphylococcus aureus NEGATIVE NEGATIVE Final    Comment:        The Xpert SA Assay (FDA approved for NASAL specimens in patients over 67 years of age), is one component of a comprehensive surveillance program.  Test performance has been validated by [facility] for patients greater than or equal to 32 year old. It is not intended to diagnose infection nor to guide or monitor treatment.   Wound culture     Status: None   Collection Time: 10/24/14 10:27 AM  Result Value Ref Range Status   Specimen Description ABSCESS  Final   Special Requests NONE  Final   Gram Stain   Final    MODERATE WBC PRESENT, PREDOMINANTLY PMN RARE SQUAMOUS EPITHELIAL CELLS PRESENT ABUNDANT GRAM POSITIVE COCCI IN PAIRS Performed at [facility]    Culture   Final    ABUNDANT ENTEROCOCCUS SPECIES Performed at [facility]    Report Status 10/27/2014 FINAL  Final   Organism ID, Bacteria ENTEROCOCCUS SPECIES  Final      Susceptibility   Enterococcus species -  MIC*    VANCOMYCIN 1 SENSITIVE Sensitive     AMPICILLIN <=2 SENSITIVE Sensitive     * ABUNDANT ENTEROCOCCUS SPECIES  Anaerobic culture     Status: None (Preliminary result)   Collection Time: 10/24/14  1:45 PM  Result Value Ref Range Status   Specimen Description TOE RIGHT ABSCESS  Final   Special Requests NONE  Final   Gram Stain PENDING  Incomplete   Culture   Final    NO ANAEROBES ISOLATED; CULTURE IN PROGRESS FOR 5 DAYS Performed at [facility]    Report Status PENDING  Incomplete  Culture, routine-abscess     Status: None   Collection Time: 10/24/14  1:45 PM  Result Value Ref Range Status   Specimen Description TOE RIGHT ABSCESS  Final   Special Requests NONE  Final   Gram Stain   Final    ABUNDANT WBC PRESENT, PREDOMINANTLY PMN NO SQUAMOUS EPITHELIAL CELLS SEEN ABUNDANT GRAM POSITIVE COCCI IN PAIRS Performed at [facility]    Culture   Final    MODERATE ENTEROCOCCUS SPECIES Performed at [facility]    Report Status 10/27/2014 FINAL  Final   Organism ID, Bacteria ENTEROCOCCUS SPECIES  Final      Susceptibility   Enterococcus species - MIC*    VANCOMYCIN 1 SENSITIVE Sensitive     AMPICILLIN <=2 SENSITIVE Sensitive     * MODERATE ENTEROCOCCUS SPECIES     Studies: No results found.  Scheduled Meds:  Scheduled Meds: . antiseptic oral rinse  7 mL Mouth Rinse BID  . carbamazepine  200 mg Oral Daily  . carbamazepine  400 mg Oral QHS  . chlorthalidone  25 mg Oral Daily  . docusate sodium  100 mg Oral BID  . enoxaparin (  LOVENOX) injection  40 mg Subcutaneous Q24H  . fluconazole  100 mg Oral Daily  . fluticasone  1 spray Each Nare Daily  . gabapentin  100 mg Oral BID  . insulin aspart  0-20 Units Subcutaneous TID WC  . insulin aspart  0-5 Units Subcutaneous QHS  . insulin aspart  4 Units Subcutaneous TID WC  . insulin glargine  25 Units Subcutaneous QHS  . levothyroxine  50 mcg Oral QAC breakfast  . pantoprazole  40 mg Oral Daily  .  piperacillin-tazobactam (ZOSYN)  IV  3.375 g Intravenous Q8H  . senna  1 tablet Oral BID  . tiotropium  18 mcg Inhalation Daily  . traZODone  50 mg Oral QHS  . vancomycin  750 mg Intravenous Q12H   Continuous Infusions: . sodium chloride      Time spent on care of this patient: 35 min   Mallory Potts, Mallory Freer, MD [phone] 10/28/2014, 3:52 PM  LOS: 4 days   [facility] Office  [phone] Pager - Text Page per [email or web address] If 7PM-7AM, please contact night-coverage [email or web address]

## 2014-10-28 NOTE — Anesthesia Postprocedure Evaluation (Signed)
  Anesthesia Post-op Note  Patient: Mallory Potts  Procedure(s) Performed: Procedure(s): RIGHT BELOW KNEE AMPUTATION (Right)  Patient Location: PACU  Anesthesia Type:General  Level of Consciousness: awake and alert   Airway and Oxygen Therapy: Patient Spontanous Breathing  Post-op Pain: mild  Post-op Assessment: Post-op Vital signs reviewed              Post-op Vital Signs: Reviewed  Last Vitals:  Filed Vitals:   10/27/14 2012  BP: 130/70  Pulse: 73  Temp: 36.4 C  Resp: 20    Complications: No apparent anesthesia complications

## 2014-10-28 NOTE — Progress Notes (Signed)
Chaplain referred to pt from nurse tech who noticed pt was crying. Chaplain offered empathetic listening and pointed out pt strengths. Chaplain and pt explored coping strategies. Chaplain offered prayer and brought pt a Bible. Chaplain will continue to follow. Page chaplain if needed before follow up.   10/28/14 1600  Clinical Encounter Type  Visited With Patient and family together  Visit Type Initial;Spiritual support  Referral From Nurse  Spiritual Encounters  Spiritual Needs Emotional;Prayer;[facility] text  Stress Factors  Patient Stress Factors Major life changes  Mallory Potts, Barbette Hair, Chaplain 10/28/2014 4:05 PM

## 2014-10-28 NOTE — Evaluation (Signed)
Physical Therapy Evaluation Patient Details Name: Jazyiah Kresl MRN: [id number] DOB: February 24, 1966 Today's Date: 10/28/2014   History of Present Illness  Patient is a 49 y/o female s/p R BKA. PMH includes DM, HTN, seizures, COPD, depression, anxiety, bipolar 1 disorder.  Clinical Impression  Patient presents with pain, post surgical deficits RLE s/p R BKA impacting mobility. Pt tolerated transfers and short distance ambulation with MIn A for balance/safety. Education provided on positioning and HEP. Recommend 24/7 S at home for safety. Pt will need w/c for household ambulation due to hx of falls and unstable left knee per pt report. Would benefit from skilled PT to maximize independence and mobility prior to return home.    Follow Up Recommendations Home health PT;Supervision/Assistance - 24 hour    Equipment Recommendations  Wheelchair (measurements PT);Wheelchair cushion (measurements PT)    Recommendations for Other Services       Precautions / Restrictions Precautions Precautions: Fall Restrictions Weight Bearing Restrictions: Yes RLE Weight Bearing: Non weight bearing      Mobility  Bed Mobility Overal bed mobility: Needs Assistance Bed Mobility: Supine to Sit     Supine to sit: Min guard;HOB elevated     General bed mobility comments: Able to pull up into long sitting. Increased time to swing around to side of bed due to pain. No physical assist needed.  Transfers Overall transfer level: Needs assistance Equipment used: Rolling walker (2 wheeled) Transfers: Sit to/from Stand Sit to Stand: Min assist         General transfer comment: Min A to rise from EOB x1 with cues for hand placement/safety.  Uncontrolled descent into chair.  Ambulation/Gait Ambulation/Gait assistance: Min assist Ambulation Distance (Feet): 5 Feet Assistive device: Rolling walker (2 wheeled) Gait Pattern/deviations: Step-to pattern;Decreased stride length   Gait velocity interpretation: Below  normal speed for age/gender General Gait Details: "hop to" gait pattern. Increased WB through [place]. Dyspnea present. Vitals stable.   Stairs            Wheelchair Mobility    Modified Rankin (Stroke Patients Only)       Balance Overall balance assessment: Needs assistance;History of Falls Sitting-balance support: Feet supported;Single extremity supported Sitting balance-Leahy Scale: Fair Sitting balance - Comments: Posterior lean during there ex EOB. Postural control: Posterior lean Standing balance support: During functional activity Standing balance-Leahy Scale: Poor Standing balance comment: Relient on RW for support.                              Pertinent Vitals/Pain Pain Assessment: Faces Faces Pain Scale: Hurts whole lot Pain Location: right residual limb with movement Pain Descriptors / Indicators: Sore;Grimacing;Aching;Moaning Pain Intervention(s): Limited activity within patient's tolerance;Monitored during session;Repositioned    Home Living Family/patient expects to be discharged to:: Private residence Living Arrangements: Other relatives Available Help at Discharge: Friend(s);Available PRN/intermittently Type of Home: Apartment Home Access: Stairs to enter Entrance Stairs-Rails: None Entrance Stairs-Number of Steps: 3 +1 threshold Home Layout: One level Home Equipment: Walker - 2 wheels;Cane - single point      Prior Function Level of Independence: Independent with assistive device(s)         Comments: using cane for mobility, family reports "unstable" and "looses balance" with h/o falls and "falling into the wall" - 3-4 falls (hitting floor) within the past year     Hand Dominance   Dominant Hand: Right    Extremity/Trunk Assessment   Upper Extremity Assessment: Defer to OT  evaluation           Lower Extremity Assessment: RLE deficits/detail RLE Deficits / Details: New BKA; Limited AROM knee flexion secondary to pain. KI  donned.       Communication   Communication: No difficulties  Cognition Arousal/Alertness: Awake/alert Behavior During Therapy: WFL for tasks assessed/performed Overall Cognitive Status: Within Functional Limits for tasks assessed                      General Comments      Exercises General Exercises - Lower Extremity Quad Sets: Right;5 reps;Supine Long Arc Quad: Right;5 reps;Seated      Assessment/Plan    PT Assessment Patient needs continued PT services  PT Diagnosis Acute pain;Difficulty walking;Generalized weakness   PT Problem List Pain;Decreased strength;Decreased range of motion;Decreased activity tolerance;Decreased mobility;Decreased balance;Impaired sensation  PT Treatment Interventions Balance training;Gait training;Stair training;Functional mobility training;Therapeutic activities;Therapeutic exercise;Wheelchair mobility training;Patient/family education   PT Goals (Current goals can be found in the Care Plan section) Acute Rehab PT Goals Patient Stated Goal: to go home to my dog PT Goal Formulation: With patient Time For Goal Achievement: 11/11/14 Potential to Achieve Goals: Good    Frequency Min 4X/week   Barriers to discharge Decreased caregiver support;Inaccessible home environment Pt lives with roommates but mostly home alone and has 3 steps to navigate to enter home.    Co-evaluation PT/OT/SLP Co-Evaluation/Treatment: Yes Reason for Co-Treatment: For patient/therapist safety PT goals addressed during session: Mobility/safety with mobility;Strengthening/ROM         End of Session Equipment Utilized During Treatment: Gait belt Activity Tolerance: Patient tolerated treatment well;Patient limited by pain Patient left: in chair;with call bell/phone within reach;with family/visitor present Nurse Communication: Mobility status         Time: [id number] PT Time Calculation (min) (ACUTE ONLY): 28 min   Charges:   PT Evaluation $Initial PT  Evaluation Tier I: 1 Procedure     PT G Codes:        Ferlando Lia A Kegan Mckeithan 10/28/2014, 1:00 PM Wray Kearns, [place], DPT [phone]

## 2014-10-28 NOTE — Progress Notes (Addendum)
Subjective: 1 Day Post-Op Procedure(s) (LRB): RIGHT BELOW KNEE AMPUTATION (Right)  Patient reports pain as mild to moderate on posterior stump.  Pain medication helps with discomfort.  Denies fever or vomiting.  Admits to eating, drinking, voiding, and defecating.  States that she feels much better in comparison to her condition prior to surgery.  Patient states that she would feel more comfortable being D/C'd to SNF or rehab center where she can get appropriate help while she recovers and learns how to navigate after surgery.  Objective:   VITALS:  Temp:  [97.1 F (36.2 C)-98.3 F (36.8 C)] 97.1 F (36.2 C) (06/08 1259) Pulse Rate:  [65-102] 65 (06/08 1259) Resp:  [16-29] 16 (06/08 1259) BP: (128-144)/(63-80) 130/63 mmHg (06/08 1259) SpO2:  [92 %-98 %] 98 % (06/08 1259) Weight:  [101.5 kg (223 lb 12.3 oz)] 101.5 kg (223 lb 12.3 oz) (06/08 0459)  Neurologically intact, Pulses palpable, non-labored even respirations.  Dressing is C/D/I.     LABS  Recent Labs  10/26/14 0445 10/27/14 2205  HGB 9.3* 9.6*  WBC 11.2* 9.8  PLT 230 234    Recent Labs  10/26/14 0445 10/27/14 2205  NA 133*  --   K 3.4*  --   CL 96*  --   CO2 28  --   BUN 14  --   CREATININE 0.94 1.35*  GLUCOSE 239*  --    No results for input(s): LABPT, INR in the last 72 hours.   Assessment/Plan: 1 Day Post-Op Procedure(s) (LRB): RIGHT BELOW KNEE AMPUTATION (Right)  Up with therapy .  Continue working on PT exercises as tolerated. D/C saline lock D/C IV antibiotics due to patient showing no signs of infection. Wait for social work consult to determine place of D/C.    Mallory Potts 10/28/2014, 2:09 PM  Pt seen and examined.  No indication for further abx.  Vanc and zosyn d/c'd.  I'll order a stump protector for her.  Continue NWB and elevate as much as possible.

## 2014-10-29 DIAGNOSIS — N179 Acute kidney failure, unspecified: Secondary | ICD-10-CM

## 2014-10-29 LAB — TYPE AND SCREEN
ABO/RH(D): A POS
Antibody Screen: NEGATIVE
Unit division: 0
Unit division: 0
Unit division: 0
Unit division: 0

## 2014-10-29 LAB — ANAEROBIC CULTURE

## 2014-10-29 LAB — GLUCOSE, CAPILLARY
Glucose-Capillary: 168 mg/dL — ABNORMAL HIGH (ref 65–99)
Glucose-Capillary: 140 mg/dL — ABNORMAL HIGH (ref 65–99)
Glucose-Capillary: 153 mg/dL — ABNORMAL HIGH (ref 65–99)
Glucose-Capillary: 90 mg/dL (ref 65–99)

## 2014-10-29 LAB — BASIC METABOLIC PANEL
ANION GAP: 10 (ref 5–15)
BUN: 13 mg/dL (ref 6–20)
CO2: 26 mmol/L (ref 22–32)
Calcium: 8.1 mg/dL — ABNORMAL LOW (ref 8.9–10.3)
Chloride: 98 mmol/L — ABNORMAL LOW (ref 101–111)
Creatinine, Ser: 1.89 mg/dL — ABNORMAL HIGH (ref 0.44–1.00)
GFR calc Af Amer: 35 mL/min — ABNORMAL LOW (ref >60)
GFR calc non Af Amer: 30 mL/min — ABNORMAL LOW (ref >60)
Glucose, Bld: 167 mg/dL — ABNORMAL HIGH (ref 65–99)
POTASSIUM: 3.2 mmol/L — AB (ref 3.5–5.1)
SODIUM: 134 mmol/L — AB (ref 135–145)

## 2014-10-29 LAB — CBC
HEMATOCRIT: 29.5 % — AB (ref 36.0–46.0)
Hemoglobin: 9.7 g/dL — ABNORMAL LOW (ref 12.0–15.0)
MCH: 29.7 pg (ref 26.0–34.0)
MCHC: 32.9 g/dL (ref 30.0–36.0)
MCV: 90.2 fL (ref 78.0–100.0)
Platelets: 237 10*3/uL (ref 150–400)
RBC: 3.27 MIL/uL — ABNORMAL LOW (ref 3.87–5.11)
RDW: 14.2 % (ref 11.5–15.5)
WBC: 8.5 10*3/uL (ref 4.0–10.5)

## 2014-10-29 MED ORDER — POTASSIUM CHLORIDE CRYS ER 20 MEQ PO TBCR
20.0000 meq | EXTENDED_RELEASE_TABLET | Freq: Once | ORAL | Status: AC
  Administered 2014-10-29: 20 meq via ORAL
  Filled 2014-10-29: qty 1

## 2014-10-29 NOTE — Progress Notes (Signed)
Orthopedic Tech Progress Note Patient Details:  [name] 20-May-1966 [id number] [employer] called for brace order Patient ID: Mallory Potts, female   DOB: [birth year], 49 y.o.   MRN: [id number]   [name] 10/29/2014, 10:37 AM

## 2014-10-29 NOTE — Clinical Social Work Placement (Signed)
   CLINICAL SOCIAL WORK PLACEMENT  NOTE  Date:  10/29/2014  Patient Details  Name: Mallory Potts MRN: [id number] Date of Birth: 06/13/1965  Clinical Social Work is seeking post-discharge placement for this patient at the [place] level of care (*CSW will initial, date and re-position this form in  chart as items are completed):  Yes   Patient/family provided with [facility] Work Department's list of facilities offering this level of care within the geographic area requested by the patient (or if unable, by the patient's family).  Yes   Patient/family informed of their freedom to choose among providers that offer the needed level of care, that participate in Medicare, Medicaid or managed care program needed by the patient, have an available bed and are willing to accept the patient.  Yes   Patient/family informed of [facility]'s ownership interest in [facility] and [facility], as well as of the fact that they are under no obligation to receive care at these facilities.  PASRR submitted to EDS on 10/29/14     PASRR number received on 10/29/14     Existing PASRR number confirmed on       FL2 transmitted to all facilities in geographic area requested by pt/family on 10/29/14     FL2 transmitted to all facilities within larger geographic area on       Patient informed that his/her managed care company has contracts with or will negotiate with certain facilities, including the following:            Patient/family informed of bed offers received.  Patient chooses bed at       Physician recommends and patient chooses bed at      Patient to be transferred to   on  .  Patient to be transferred to facility by       Patient family notified on   of transfer.  Name of family member notified:        PHYSICIAN Please prepare priority discharge summary, including medications     Additional Comment:     _______________________________________________ Dulcy Fanny, LCSW 10/29/2014, 2:29 PM

## 2014-10-29 NOTE — Clinical Social Work Note (Signed)
Clinical Social Work Assessment  Patient Details  Name: Mallory Potts MRN: [id number] Date of Birth: [birth year]  Date of referral:  10/29/14               Reason for consult:  Grief and Loss, Discharge Planning, Facility Placement (new amputee)                Permission sought to share information with:  Facility Sport and exercise psychologist [facility] SNFs) Permission granted to share information::  Yes, Verbal Permission Granted  Name::        Agency::   [facility] SNFs)  Relationship::     Contact Information:     Housing/Transportation Living arrangements for the past 2 months:  Single Family Home Source of Information:  Patient Patient Interpreter Needed:  None Criminal Activity/Legal Involvement Pertinent to Current Situation/Hospitalization:  No - Comment as needed Significant Relationships:  Siblings (sister) Lives with:  Other (Comment) ("roommates") Do you feel safe going back to the place where you live?  No (has stairs scared of falling) Need for family participation in patient care:  No (Coment)  Care giving concerns:  No caregivers at bedside during the course of this assessment.   Social Worker assessment / plan:  CSW reviewed current disposition with patient.  Patient is agreeable to [facility].  Patient is requesting SNF at this time due to having 3 steps at home that she does not feel that she can safely maneuver at this time.  Patient remains to be positive and optimistic regarding px.  Patient is not familiar with any facilities.  CSW will provide list of bed offers once available and review choices with patient.  Employment status:  Disabled (Comment on whether or not currently receiving Disability) Insurance information:  Medicare PT Recommendations:  24 Hour Supervision, [place] / Referral to community resources:  [place]  Patient/Family's Response to care:  Patient is appreciative of CSW assistance  and the ability to have STR at SNF prior to discharging home.  Patient/Family's Understanding of and Emotional Response to Diagnosis, Current Treatment, and Prognosis:  Patient has insight regarding her px and current health situation.  Patient remains positive in spirit and optimistic regarding px and plan of care.  Emotional Assessment Appearance:  Appears stated age Attitude/Demeanor/Rapport:   (appropriate) Affect (typically observed):  Accepting, Calm, Stable, Pleasant, Hopeful Orientation:  Oriented to Self, Oriented to Place, Oriented to  Time, Oriented to Situation Alcohol / Substance use:  Not Applicable Psych involvement (Current and /or in the community):  No (Comment)  Discharge Needs  Concerns to be addressed:  No discharge needs identified Readmission within the last 30 days:    Current discharge risk:  None Barriers to Discharge:  No Barriers Identified   Dulcy Fanny, LCSW 10/29/2014, 2:24 PM

## 2014-10-29 NOTE — Progress Notes (Signed)
[facility] Progress Note   Mallory Potts [id number] DOB: 1966/05/02 DOA: 10/23/2014 PCP: Arnoldo Morale, MD  Brief narrative: Mallory Potts is a 49 y.o. female with insulin-dependent diabetes mellitus, COPD, hypertension presented with right foot pain and swelling having failed outpatient antibiotics.   Approximately 2 weeks ago she hit her second toe and broke the distal portion of the toe. Since then she's been having progressive  pain and swelling. Swelling has now progressed up to her foot. She was seen in ED on 5/24. She was diagnosed with cellulitis and placed on oral clindamycin but continued to have increasing swelling despite taking this medication appropriately.  Subjective: Complaining of pain right LE. Specially when she moves. Was able to have a bowel movement.   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   -Underwent I and D for foot and amputation of right second toe on 6/4-orthopedic surgery recommending further amputation. - Patient underwent  BKA 6-7.  Anemia - iron deficiency.  -Continue with  Iron supplements. She will need screening colonoscopy  AKI; mild hyponatremia. Hypokalemia.  Continue with IV fluids.  Cr mildly increase to 1.9 from 0.94 Continue with IV fluids. Cr decrease to 1.8. Replete K.  Replete labs in am.   Vaginal candidiasis;  Continue with  fluconazole.     Epilepsy -continue Tegretol    COPD (chronic obstructive pulmonary disease) -Currently stable --continue albuterol when necessary and Spiriva     Bipolar 1 disorder -Continue trazodone    hypothyroidism  -cont Synthroid    DM (diabetes mellitus)-type II-uncontrolled -A1c on 6/4 was 11.1 -counseling provide to patient.  -Continue with lantus 25 units daily.  -Continue high-dose sliding scale with mealtime insulin - Janumet on hold  Morbid  obesity Body mass index is 38.52 kg/(m^2).    Code Status: full code Family Communication: care discussed with patient.  Disposition Plan: SW consulted for SNF referral.  DVT prophylaxis: Heparin Consultants:ortho Procedures:  Antibiotics: Anti-infectives    Start     Dose/Rate Route Frequency Ordered Stop   10/28/14 1430  fluconazole (DIFLUCAN) tablet 100 mg     100 mg Oral Daily 10/28/14 1428 10/31/14 0959   10/27/14 1600  vancomycin (VANCOCIN) 1 GM/200ML IVPB    Comments:  Ancil Boozer  : cabinet override      10/27/14 1600 10/27/14 1650   10/24/14 1800  vancomycin (VANCOCIN) IVPB 750 mg/150 ml premix  Status:  Discontinued     750 mg 150 mL/hr over 60 Minutes Intravenous Every 12 hours 10/24/14 0410 10/28/14 2218   10/24/14 1000  clindamycin (CLEOCIN) capsule 450 mg  Status:  Discontinued     450 mg Oral 3 times daily 10/24/14 0346 10/24/14 0401   10/24/14 0800  piperacillin-tazobactam (ZOSYN) IVPB 3.375 g  Status:  Discontinued     3.375 g 12.5 mL/hr over 240 Minutes Intravenous Every 8 hours 10/24/14 0356 10/28/14 2218   10/24/14 0400  vancomycin (VANCOCIN) 2,000 mg in sodium chloride 0.9 % 500 mL IVPB  Status:  Discontinued     2,000 mg 250 mL/hr over 120 Minutes Intravenous  Once 10/24/14 0355 10/24/14 1300   10/24/14 0130  vancomycin (VANCOCIN) 2,000 mg in sodium chloride 0.9 % 500 mL IVPB  Status:  Discontinued     2,000 mg 250 mL/hr over 120 Minutes Intravenous  Once 10/24/14 0121 10/24/14 0625   10/24/14 0015  piperacillin-tazobactam (ZOSYN) IVPB 3.375  g     3.375 g 12.5 mL/hr over 240 Minutes Intravenous  Once 10/24/14 0008 10/24/14 0217      Objective: Filed Weights   10/24/14 0229 10/28/14 0459 10/29/14 0453  Weight: 104.327 kg (230 lb) 101.5 kg (223 lb 12.3 oz) 105 kg (231 lb 7.7 oz)    Intake/Output Summary (Last 24 hours) at 10/29/14 1546 Last data filed at 10/29/14 1100  Gross per 24 hour  Intake 1868.75 ml  Output      0 ml  Net 1868.75 ml      Vitals Filed Vitals:   10/28/14 1259 10/28/14 2145 10/29/14 0453 10/29/14 1237  BP: 130/63 113/60 133/69   Pulse: 65 65 84   Temp: 97.1 F (36.2 C) 98.2 F (36.8 C) 97.6 F (36.4 C)   TempSrc: Oral Oral Oral   Resp: 16 18 17    Height:      Weight:   105 kg (231 lb 7.7 oz)   SpO2: 98% 98% 96% 97%    Exam:  General:  NAD  Cardiovascular: regular rate and rhythm, S1/S2 No murmur  Respiratory: clear to auscultation bilaterally   Abdomen: Soft, +Bowel sounds, non tender, non distended, no guarding  MSK: No LE edema, cyanosis or clubbing. S/P AKA, with clean dressing.   Data Reviewed: Basic Metabolic Panel:  Recent Labs Lab 10/24/14 0501 10/25/14 0302 10/26/14 0445 10/27/14 2205 10/28/14 1813 10/29/14 0520  NA 126* 129* 133*  --  132* 134*  K 3.1* 3.8 3.4*  --  3.3* 3.2*  CL 88* 94* 96*  --  95* 98*  CO2 28 26 28   --  27 26  GLUCOSE 362* 287* 239*  --  215* 167*  BUN 14 13 14   --  15 13  CREATININE 0.95 1.11* 0.94 1.35* 1.99* 1.89*  CALCIUM 7.9* 7.7* 8.2*  --  8.1* 8.1*   Liver Function Tests:  Recent Labs Lab 10/23/14 2040  AST 19  ALT 16  ALKPHOS 63  BILITOT 0.2*  PROT 8.3*  ALBUMIN 2.4*   No results for input(s): LIPASE, AMYLASE in the last 168 hours. No results for input(s): AMMONIA in the last 168 hours. CBC:  Recent Labs Lab 10/23/14 2040 10/24/14 0501 10/25/14 0918 10/26/14 0445 10/27/14 2205 10/29/14 0520  WBC 20.0* 15.9* 12.5* 11.2* 9.8 8.5  NEUTROABS 17.7*  --  10.5*  --   --   --   HGB 9.2* 8.1* 8.9* 9.3* 9.6* 9.7*  HCT 27.6* 24.6* 26.5* 27.9* 28.6* 29.5*  MCV 91.7 89.8 89.2 90.0 89.9 90.2  PLT 270 242 217 230 234 237   Cardiac Enzymes: No results for input(s): CKTOTAL, CKMB, CKMBINDEX, TROPONINI in the last 168 hours. BNP (last 3 results) No results for input(s): BNP in the last 8760 hours.  ProBNP (last 3 results) No results for input(s): PROBNP in the last 8760 hours.  CBG:  Recent Labs Lab 10/28/14 1313  10/28/14 1655 10/28/14 2143 10/29/14 0623 10/29/14 1211  GLUCAP 185* 231* 213* 168* 140*    Recent Results (from the past 240 hour(s))  Culture, blood (routine x 2)     Status: None (Preliminary result)   Collection Time: 10/24/14  9:07 AM  Result Value Ref Range Status   Specimen Description BLOOD RIGHT ARM  Final   Special Requests BOTTLES DRAWN AEROBIC ONLY 10CC AEROBIC ONLY   Final   Culture   Final           BLOOD CULTURE RECEIVED NO GROWTH TO DATE  CULTURE WILL BE HELD FOR 5 DAYS BEFORE ISSUING A FINAL NEGATIVE REPORT Performed at [facility]    Report Status PENDING  Incomplete  Culture, blood (routine x 2)     Status: None (Preliminary result)   Collection Time: 10/24/14  9:12 AM  Result Value Ref Range Status   Specimen Description BLOOD RIGHT HAND  Final   Special Requests BOTTLES DRAWN AEROBIC ONLY 5CC AEROBIC ONLY   Final   Culture   Final           BLOOD CULTURE RECEIVED NO GROWTH TO DATE CULTURE WILL BE HELD FOR 5 DAYS BEFORE ISSUING A FINAL NEGATIVE REPORT Performed at [facility]    Report Status PENDING  Incomplete  Surgical pcr screen     Status: None   Collection Time: 10/24/14 10:21 AM  Result Value Ref Range Status   MRSA, PCR NEGATIVE NEGATIVE Final   Staphylococcus aureus NEGATIVE NEGATIVE Final    Comment:        The Xpert SA Assay (FDA approved for NASAL specimens in patients over 54 years of age), is one component of a comprehensive surveillance program.  Test performance has been validated by [facility] for patients greater than or equal to 45 year old. It is not intended to diagnose infection nor to guide or monitor treatment.   Wound culture     Status: None   Collection Time: 10/24/14 10:27 AM  Result Value Ref Range Status   Specimen Description ABSCESS  Final   Special Requests NONE  Final   Gram Stain   Final    MODERATE WBC PRESENT, PREDOMINANTLY PMN RARE SQUAMOUS EPITHELIAL CELLS PRESENT ABUNDANT GRAM POSITIVE  COCCI IN PAIRS Performed at [facility]    Culture   Final    ABUNDANT ENTEROCOCCUS SPECIES Performed at [facility]    Report Status 10/27/2014 FINAL  Final   Organism ID, Bacteria ENTEROCOCCUS SPECIES  Final      Susceptibility   Enterococcus species - MIC*    VANCOMYCIN 1 SENSITIVE Sensitive     AMPICILLIN <=2 SENSITIVE Sensitive     * ABUNDANT ENTEROCOCCUS SPECIES  Anaerobic culture     Status: None   Collection Time: 10/24/14  1:45 PM  Result Value Ref Range Status   Specimen Description TOE RIGHT ABSCESS  Final   Special Requests NONE  Final   Gram Stain   Final    FEW WBC PRESENT, PREDOMINANTLY PMN NO SQUAMOUS EPITHELIAL CELLS SEEN RARE GRAM POSITIVE COCCI IN PAIRS Performed at [facility]    Culture   Final    NO ANAEROBES ISOLATED Performed at [facility]    Report Status 10/29/2014 FINAL  Final  Culture, routine-abscess     Status: None   Collection Time: 10/24/14  1:45 PM  Result Value Ref Range Status   Specimen Description TOE RIGHT ABSCESS  Final   Special Requests NONE  Final   Gram Stain   Final    ABUNDANT WBC PRESENT, PREDOMINANTLY PMN NO SQUAMOUS EPITHELIAL CELLS SEEN ABUNDANT GRAM POSITIVE COCCI IN PAIRS Performed at [facility]    Culture   Final    MODERATE ENTEROCOCCUS SPECIES Performed at [facility]    Report Status 10/27/2014 FINAL  Final   Organism ID, Bacteria ENTEROCOCCUS SPECIES  Final      Susceptibility   Enterococcus species - MIC*    VANCOMYCIN 1 SENSITIVE Sensitive     AMPICILLIN <=2 SENSITIVE Sensitive     *  MODERATE ENTEROCOCCUS SPECIES     Studies: No results found.  Scheduled Meds:  Scheduled Meds: . antiseptic oral rinse  7 mL Mouth Rinse BID  . carbamazepine  200 mg Oral Daily  . carbamazepine  400 mg Oral QHS  . chlorthalidone  25 mg Oral Daily  . docusate sodium  100 mg Oral BID  . enoxaparin (LOVENOX) injection  40 mg Subcutaneous Q24H  . ferrous sulfate   325 mg Oral BID WC  . fluconazole  100 mg Oral Daily  . fluticasone  1 spray Each Nare Daily  . gabapentin  100 mg Oral BID  . insulin aspart  0-20 Units Subcutaneous TID WC  . insulin aspart  0-5 Units Subcutaneous QHS  . insulin aspart  4 Units Subcutaneous TID WC  . insulin glargine  25 Units Subcutaneous QHS  . levothyroxine  50 mcg Oral QAC breakfast  . pantoprazole  40 mg Oral Daily  . senna  1 tablet Oral BID  . tiotropium  18 mcg Inhalation Daily  . traZODone  50 mg Oral QHS   Continuous Infusions: . sodium chloride 75 mL/hr at 10/29/14 1043    Time spent on care of this patient: 35 min   Josilyn Shippee, Cassie Freer, MD [phone] 10/29/2014, 3:46 PM  LOS: 5 days   [facility] Office  [phone] Pager - Text Page per [email or web address] If 7PM-7AM, please contact night-coverage [email or web address]

## 2014-10-29 NOTE — Progress Notes (Signed)
Subjective: 2 Days Post-Op Procedure(s) (LRB): RIGHT BELOW KNEE AMPUTATION (Right) Patient reports pain as mild.  Stump protector fit by [employer].  No c/o.  Objective: Vital signs in last 24 hours: Temp:  [97.6 F (36.4 C)-98.2 F (36.8 C)] 97.6 F (36.4 C) (06/09 0453) Pulse Rate:  [65-84] 84 (06/09 0453) Resp:  [17-18] 17 (06/09 0453) BP: (113-133)/(60-69) 133/69 mmHg (06/09 0453) SpO2:  [96 %-98 %] 97 % (06/09 1237) Weight:  [105 kg (231 lb 7.7 oz)] 105 kg (231 lb 7.7 oz) (06/09 0453)  Intake/Output from previous day: 06/08 0701 - 06/09 0700 In: 1748.8 [P.O.:480; I.V.:1018.8; IV Piggyback:250] Out: -  Intake/Output this shift: Total I/O In: 360 [P.O.:360] Out: -    Recent Labs  10/27/14 2205 10/29/14 0520  HGB 9.6* 9.7*    Recent Labs  10/27/14 2205 10/28/14 0610 10/29/14 0520  WBC 9.8  --  8.5  RBC 3.18* 3.09* 3.27*  HCT 28.6*  --  29.5*  PLT 234  --  237    Recent Labs  10/28/14 1813 10/29/14 0520  NA 132* 134*  K 3.3* 3.2*  CL 95* 98*  CO2 27 26  BUN 15 13  CREATININE 1.99* 1.89*  GLUCOSE 215* 167*  CALCIUM 8.1* 8.1*   No results for input(s): LABPT, INR in the last 72 hours.  R LE with stump protector in place.  Stump dressed and dry.  Assessment/Plan: 2 Days Post-Op Procedure(s) (LRB): RIGHT BELOW KNEE AMPUTATION (Right) Up with therapy  NWB on the R LE.  Stump protector on at all times except when doing ROM with PT. Ok for d/c to snf when allowed by hospitalist.  Burnis Medin plan to leave the dressing in place until she returns to see me in the office in two weeks.  [name] 10/29/2014, 1:38 PM

## 2014-10-29 NOTE — Progress Notes (Signed)
Occupational Therapy Treatment Patient Details Name: Carmilita Krzywicki MRN: [id number] DOB: 12-Oct-1965 Today's Date: 10/29/2014    History of present illness Patient is a 49 y/o female s/p R BKA. PMH includes DM, HTN, seizures, COPD, depression, anxiety, bipolar 1 disorder.   OT comments  Pt progressing towards goals, continue plan of care for now. D/C plan and recommendation changed > ST SNF for additional rehab prior to pt discharging to home. Educated pt and family member on strengthening HEP using theraband, pt supervision for exercises at this time.   Follow Up Recommendations  Supervision/Assistance - 24 hour;SNF    Equipment Recommendations  Other (comment) (TBD next venue of care)    Recommendations for Other Services  None at this time  Precautions / Restrictions Precautions Precautions: Fall Required Braces or Orthoses: Knee Immobilizer - Right (no order, per pt report) Restrictions Weight Bearing Restrictions: Yes RLE Weight Bearing: Non weight bearing     Mobility Bed Mobility Overal bed mobility: Needs Assistance Bed Mobility: Supine to Sit     Supine to sit: Supervision;HOB elevated     General bed mobility comments: Pt pulled up into long sitting with supervision. Pt maintained this position for BUE strengthening HEP  Transfers General transfer comment: Did not occur this session        ADL Overall ADL's : Needs assistance/impaired General ADL Comments: Pt requesting SNF for ST rehab prior to going home. Pt will benefit from this for overall safety. Pt engaged in bed mobility and sat in long sitting for BUE HEP using theraband, see exercises below.      Cognition   Behavior During Therapy: WFL for tasks assessed/performed Overall Cognitive Status: Within Functional Limits for tasks assessed     Exercises General Exercises - Upper Extremity Shoulder Flexion: Strengthening;Both;Theraband;10 reps;Seated Shoulder ABduction:  Strengthening;Both;Theraband;Seated Shoulder ADduction: Strengthening;Both;Seated;10 reps Elbow Extension: Strengthening;Both;10 reps;Seated           Pertinent Vitals/ Pain       Pain Assessment: Faces Faces Pain Scale: Hurts whole lot Pain Location: right residual limb with movement Pain Descriptors / Indicators: Grimacing;Guarding Pain Intervention(s): Limited activity within patient's tolerance;Monitored during session;Repositioned   Frequency Min 2X/week     Progress Toward Goals  OT Goals(current goals can now befound in the care plan section)  Progress towards OT goals: Progressing toward goals     Plan Discharge plan needs to be updated    Activity Tolerance Patient tolerated treatment well  Patient Left in bed;with call bell/phone within reach;with nursing/sitter in room;with family/visitor present    Time: [id number] OT Time Calculation (min): 33 min  Charges: OT General Charges $OT Visit: 1 Procedure OT Treatments $Therapeutic Exercise: 23-37 mins  Paulene Tayag , MS, OTR/L, CLT Pager: [phone]  10/29/2014, 10:53 AM

## 2014-10-29 NOTE — Care Management Utilization Note (Signed)
Utilization review completed by Dayanara Sherrill N. Breyden Jeudy, RN BSN 

## 2014-10-29 NOTE — Progress Notes (Signed)
Physical Therapy Treatment Patient Details Name: Mallory Potts MRN: [id number] DOB: Oct 16, 1965 Today's Date: 10/29/2014    History of Present Illness Patient is a 49 y/o female s/p R BKA. PMH includes DM, HTN, seizures, COPD, depression, anxiety, bipolar 1 disorder.    PT Comments    Patient progressing slowly towards PT goals. Continues to have increased pain in right residual limb esp with movement- reporting phantom pains in right foot. Utilized relaxation and visualization techniques during session as pt gets anxious during mobility. Discharge recommendation updated to ST SNF as pt not safe to return home without 24/7 S. Will continue to follow to maximize independence and mobility.   Follow Up Recommendations  SNF     Equipment Recommendations  Wheelchair (measurements PT);Wheelchair cushion (measurements PT)    Recommendations for Other Services       Precautions / Restrictions Precautions Precautions: Fall Required Braces or Orthoses: Other Brace/Splint Other Brace/Splint: stump protector RLE Restrictions Weight Bearing Restrictions: Yes RLE Weight Bearing: Non weight bearing    Mobility  Bed Mobility Overal bed mobility: Needs Assistance Bed Mobility: Supine to Sit     Supine to sit: Supervision;HOB elevated     General bed mobility comments: Pt pulled up into long sitting with supervision. No assist needed. Increased time to get to EOB due to increased pain and anxiety.  Transfers Overall transfer level: Needs assistance Equipment used: Rolling walker (2 wheeled) Transfers: Sit to/from Stand Sit to Stand: Min assist;Mod assist         General transfer comment: Min A to rise from EOB with cues for hand placement; Mod A to rise from toilet using grab bar - multiple attempts.  Ambulation/Gait Ambulation/Gait assistance: Min assist Ambulation Distance (Feet): 15 Feet (x2 bouts) Assistive device: Rolling walker (2 wheeled) Gait Pattern/deviations: Step-to  pattern;Decreased stride length   Gait velocity interpretation: Below normal speed for age/gender General Gait Details: "hop to" gait pattern. Increased WB through [place]. Dyspnea present.    Stairs            Wheelchair Mobility    Modified Rankin (Stroke Patients Only)       Balance Overall balance assessment: Needs assistance Sitting-balance support: Feet supported;No upper extremity supported Sitting balance-Leahy Scale: Fair     Standing balance support: During functional activity Standing balance-Leahy Scale: Poor                      Cognition Arousal/Alertness: Awake/alert Behavior During Therapy: WFL for tasks assessed/performed;Anxious Overall Cognitive Status: Within Functional Limits for tasks assessed                      Exercises General Exercises - Upper Extremity Shoulder Flexion: Strengthening;Both;Theraband;10 reps;Seated Shoulder ABduction: Strengthening;Both;Theraband;Seated Shoulder ADduction: Strengthening;Both;Seated;10 reps Elbow Extension: Strengthening;Both;10 reps;Seated    General Comments        Pertinent Vitals/Pain Pain Assessment: Faces Faces Pain Scale: Hurts whole lot Pain Location: right residual limb with movement Pain Descriptors / Indicators: Grimacing;Guarding;Moaning Pain Intervention(s): Monitored during session;Repositioned;Utilized relaxation techniques    Home Living                      Prior Function            PT Goals (current goals can now be found in the care plan section) Progress towards PT goals: Progressing toward goals    Frequency  Min 3X/week    PT Plan Discharge plan needs to be updated  Co-evaluation             End of Session Equipment Utilized During Treatment: Gait belt Activity Tolerance: Patient limited by pain Patient left: in chair;with family/visitor present;with call bell/phone within reach     Time: 1157-1224 PT Time Calculation (min)  (ACUTE ONLY): 27 min  Charges:  $Gait Training: 8-22 mins $Therapeutic Activity: 8-22 mins                    G Codes:      Njeri Vicente A Jackelin Correia 10/29/2014, 1:14 PM  Wray Kearns, [place], DPT [phone]

## 2014-10-30 DIAGNOSIS — N179 Acute kidney failure, unspecified: Secondary | ICD-10-CM

## 2014-10-30 LAB — BASIC METABOLIC PANEL
Anion gap: 8 (ref 5–15)
BUN: 14 mg/dL (ref 6–20)
CO2: 26 mmol/L (ref 22–32)
Calcium: 7.9 mg/dL — ABNORMAL LOW (ref 8.9–10.3)
Chloride: 103 mmol/L (ref 101–111)
Creatinine, Ser: 1.88 mg/dL — ABNORMAL HIGH (ref 0.44–1.00)
GFR calc Af Amer: 35 mL/min — ABNORMAL LOW (ref >60)
GFR calc non Af Amer: 30 mL/min — ABNORMAL LOW (ref >60)
Glucose, Bld: 157 mg/dL — ABNORMAL HIGH (ref 65–99)
Potassium: 3.2 mmol/L — ABNORMAL LOW (ref 3.5–5.1)
Sodium: 137 mmol/L (ref 135–145)

## 2014-10-30 LAB — CULTURE, BLOOD (ROUTINE X 2)
Culture: NO GROWTH
Culture: NO GROWTH

## 2014-10-30 LAB — GLUCOSE, CAPILLARY
Glucose-Capillary: 141 mg/dL — ABNORMAL HIGH (ref 65–99)
Glucose-Capillary: 125 mg/dL — ABNORMAL HIGH (ref 65–99)

## 2014-10-30 MED ORDER — INSULIN GLARGINE 100 UNIT/ML ~~LOC~~ SOLN: 22.0000 [IU] | Freq: Every day | SUBCUTANEOUS | Status: DC

## 2014-10-30 MED ORDER — POTASSIUM CHLORIDE CRYS ER 20 MEQ PO TBCR
20.0000 meq | EXTENDED_RELEASE_TABLET | Freq: Once | ORAL | Status: AC
  Administered 2014-10-30: 20 meq via ORAL
  Filled 2014-10-30: qty 1

## 2014-10-30 MED ORDER — DOCUSATE SODIUM 100 MG PO CAPS: 100.0000 mg | ORAL_CAPSULE | Freq: Two times a day (BID) | ORAL | Status: DC

## 2014-10-30 MED ORDER — FERROUS SULFATE 325 (65 FE) MG PO TABS: 325.0000 mg | ORAL_TABLET | Freq: Two times a day (BID) | ORAL | Status: DC

## 2014-10-30 MED ORDER — HYDROCODONE-ACETAMINOPHEN 5-325 MG PO TABS: 1.0000 | ORAL_TABLET | Freq: Four times a day (QID) | ORAL | Status: DC | PRN

## 2014-10-30 MED ORDER — INSULIN ASPART 100 UNIT/ML ~~LOC~~ SOLN: 4.0000 [IU] | Freq: Three times a day (TID) | SUBCUTANEOUS | Status: DC

## 2014-10-30 NOTE — Progress Notes (Signed)
Subjective: 3 Days Post-Op Procedure(s) (LRB): RIGHT BELOW KNEE AMPUTATION (Right)  Patient reports pain as mild.  Sitting comfortably in bed eating breakfast.  In good spirits and pleased with her progress.  Objective:   VITALS:  Temp:  [97.1 F (36.2 C)-98.2 F (36.8 C)] 97.9 F (36.6 C) (06/10 0559) Pulse Rate:  [62-70] 69 (06/10 0559) Resp:  [16-18] 17 (06/10 0559) BP: (100-127)/(58-60) 122/58 mmHg (06/10 0559) SpO2:  [96 %-99 %] 97 % (06/10 0559) Weight:  [108.3 kg (238 lb 12.1 oz)] 108.3 kg (238 lb 12.1 oz) (06/10 0559)  Neurologically intact ABD soft Neurovascular intact Sensation intact distally Intact pulses distally Stump protector in place.  Dressing C/D/I   LABS  Recent Labs  10/27/14 2205 10/29/14 0520  HGB 9.6* 9.7*  WBC 9.8 8.5  PLT 234 237    Recent Labs  10/29/14 0520 10/30/14 0543  NA 134* 137  K 3.2* 3.2*  CL 98* 103  CO2 26 26  BUN 13 14  CREATININE 1.89* 1.88*  GLUCOSE 167* 157*   No results for input(s): LABPT, INR in the last 72 hours.   Assessment/Plan: 3 Days Post-Op Procedure(s) (LRB): RIGHT BELOW KNEE AMPUTATION (Right)  Up with therapy Discharge to SNF when hospitalist deem her medically fit, and social work has a place set up for her.  We will plan to see her in the office 2 week po for wound assessment and to get her set up with a stump shrinker.  Marya Amsler [facility] 10/30/2014, 7:34 AM

## 2014-10-30 NOTE — Progress Notes (Signed)
Report called to [facility]

## 2014-10-30 NOTE — Discharge Summary (Signed)
Physician Discharge Summary  Mallory Potts [id number]:[id number] DOB: 02-06-1966 DOA: 10/23/2014  PCP: Arnoldo Morale, MD  Admit date: 10/23/2014 Discharge date: 10/30/2014  Time spent: 35 minutes  Recommendations for Outpatient Follow-up:  Needs B-met in 24 to 48 hours, to follow renal function.  Cbc to follow hb.  Need to follow up with Dr Doran Durand post AKA.   Discharge Diagnoses:    Cellulitis of foot   Acute renal failure   DM (diabetes mellitus), type 2, uncontrolled   Epilepsy   COPD (chronic obstructive pulmonary disease)   Bipolar 1 disorder   OCD (obsessive compulsive disorder)   Hypertension   Hyponatremia   Hypokalemia   Diabetic infection of right foot    Discharge Condition: stable.   Diet recommendation: Carb modified.   Filed Weights   10/28/14 0459 10/29/14 0453 10/30/14 0559  Weight: 101.5 kg (223 lb 12.3 oz) 105 kg (231 lb 7.7 oz) 108.3 kg (238 lb 12.1 oz)    History of present illness:  49 yo female IDDM, copd, htn, comes in with worsening left foot swelling and redness despite over 7 days of po clindamycin. Pt reports she has been taking her antibiotics but the foot is getting worse. The redness is up past the ankle now. She had an injury and stubbed her toe which ripped some skin off, now the toe is blackish. No n/v. No fevers at home. Glucose have been more elevated than usual. Asked to admit pt for failed outpt treatment and worsening cellulits/wound to foot.  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  -Underwent I and D for foot and amputation of right second toe on 6/4-orthopedic surgery recommending further amputation. - Patient underwent BKA 6-7.  Anemia - iron deficiency.  -Continue with Iron supplements. She will need screening colonoscopy  AKI; mild hyponatremia. Hypokalemia.  improved with IV fluids.  Cr  mildly increase to 1.9 from 0.94 Cr decrease to 1.8. Replete K.  Renal function stable, need B-met to follow renal function.  Hold metformin at discharge  Vaginal candidiasis;  Treated with  fluconazole.    Epilepsy -continue Tegretol   COPD (chronic obstructive pulmonary disease) -Currently stable --continue albuterol when necessary and Spiriva    Bipolar 1 disorder -Continue trazodone   hypothyroidism  -cont Synthroid   DM (diabetes mellitus)-type II-uncontrolled -A1c on 6/4 was 11.1 -counseling provide to patient.  -Continue with lantus 22 units daily.  -Continue high-dose sliding scale with mealtime insulin - Janumet and metformin on hold  Morbid obesity Body mass index is 38.52 kg/(m^2).   Procedures:  AKA  Consultations:  Ortho  Discharge Exam: Filed Vitals:   10/30/14 0559  BP: 122/58  Pulse: 69  Temp: 97.9 F (36.6 C)  Resp: 17    General: Alert in no distress.  Cardiovascular: S 1, S 2 RRR Respiratory: CTA  Discharge Instructions   Discharge Instructions    Diet - low sodium heart healthy    Complete by:  As directed      Increase activity slowly    Complete by:  As directed      Non weight bearing    Complete by:  As directed   Laterality:  right  Extremity:  Lower     Nursing communication    Complete by:  As directed   Leave dressing in place on stump until first post op visit.  Stump protector on at  all times except hygiene and PT.          Current Discharge Medication List    START taking these medications   Details  docusate sodium (COLACE) 100 MG capsule Take 1 capsule (100 mg total) by mouth 2 (two) times daily. Qty: 10 capsule, Refills: 0    ferrous sulfate 325 (65 FE) MG tablet Take 1 tablet (325 mg total) by mouth 2 (two) times daily with a meal. Qty: 60 tablet, Refills: 3    insulin aspart (NOVOLOG) 100 UNIT/ML injection Inject 4 Units into the skin 3 (three) times daily with meals. Qty: 10 mL, Refills:  11      CONTINUE these medications which have CHANGED   Details  HYDROcodone-acetaminophen (NORCO) 5-325 MG per tablet Take 1 tablet by mouth every 6 (six) hours as needed. Qty: 10 tablet, Refills: 0    insulin glargine (LANTUS) 100 UNIT/ML injection Inject 0.22 mLs (22 Units total) into the skin at bedtime. Qty: 10 mL, Refills: 11      CONTINUE these medications which have NOT CHANGED   Details  acetaminophen (TYLENOL) 500 MG tablet Take 1,000 mg by mouth every 6 (six) hours as needed for mild pain, moderate pain or headache.    albuterol (PROVENTIL HFA;VENTOLIN HFA) 108 (90 BASE) MCG/ACT inhaler Inhale 2 puffs into the lungs every 6 (six) hours as needed for wheezing. Breathing    albuterol (PROVENTIL) (2.5 MG/3ML) 0.083% nebulizer solution Take 2.5 mg by nebulization 2 times daily at 12 noon and 4 pm.    carbamazepine (TEGRETOL) 200 MG tablet Take 200-400 mg by mouth 2 (two) times daily. Take 200 mg every morning and 400 mg at bedtime.    cetirizine (ZYRTEC) 10 MG tablet Take 10 mg by mouth daily.    chlorthalidone (HYGROTON) 25 MG tablet Take 25 mg by mouth daily.    fluticasone (FLONASE) 50 MCG/ACT nasal spray Place 1 spray into both nostrils daily.    gabapentin (NEURONTIN) 100 MG capsule Take 100 mg by mouth 2 (two) times daily.    levothyroxine (SYNTHROID, LEVOTHROID) 50 MCG tablet Take 50 mcg by mouth daily before breakfast.    Omega-3 Fatty Acids (FISH OIL) 1000 MG CAPS Take 1,000 mg by mouth 2 (two) times daily.    omeprazole (PRILOSEC) 20 MG capsule Take 20 mg by mouth daily.    tiotropium (SPIRIVA) 18 MCG inhalation capsule Place 18 mcg into inhaler and inhale daily.    traZODone (DESYREL) 50 MG tablet Take 50 mg by mouth at bedtime.      STOP taking these medications     Aspirin-Acetaminophen-Caffeine (ANACIN ADVANCED HEADACHE PO)      Aspirin-Caffeine 400-32 MG TABS      clindamycin (CLEOCIN) 150 MG capsule      diphenhydrAMINE (BENADRYL) 25 mg capsule       furosemide (LASIX) 40 MG tablet      glipiZIDE (GLUCOTROL) 5 MG tablet      meloxicam (MOBIC) 15 MG tablet      Oxycodone HCl 10 MG TABS      sitaGLIPtin-metformin (JANUMET) 50-1000 MG per tablet      albuterol (PROVENTIL) (5 MG/ML) 0.5% nebulizer solution        Allergies  Allergen Reactions  . Lithium Shortness Of Breath  . Milk-Related Compounds Nausea And Vomiting  . Other     Bell peppers. Mouth sores.    . Abilify [Aripiprazole] Rash  . Aripiprazole Rash   Follow-up Information    Follow up with  [name], MD. Schedule an appointment as soon as possible for a visit in 2 weeks.   Specialty:  Orthopedic Surgery   Contact information:   [address] [place] [address] [phone]       Follow up with Arnoldo Morale, MD In 1 week.   Specialty:  Family Medicine   Contact information:   [place] [address] [phone]        The results of significant diagnostics from this hospitalization (including imaging, microbiology, ancillary and laboratory) are listed below for reference.    Significant Diagnostic Studies: Dg Foot Complete Right  Nov 05, 2014   CLINICAL DATA:  Cellulitis of the foot. Second toe fracture 2 weeks prior.  EXAM: RIGHT FOOT COMPLETE - 3+ VIEW  COMPARISON:  Radiographs 10/13/2014  FINDINGS: Avulsion fracture from the distal seconds tuft, unchanged in appearance. No acute fracture. 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 No frank osseous erosions or periosteal reaction. Plantar calcaneal spur and Achilles tendon enthesophyte again seen. There is progressive soft tissue edema about the foot. No radiographic findings of soft tissue air or foreign body.  IMPRESSION: 1. Avulsion fracture of the distal second toe, unchanged in alignment. 2. Increased soft tissue edema about the foot. No radiographic findings of osteomyelitis or soft tissue air.   Electronically Signed   By: Jeb Levering M.D.    On: 05-Nov-2014 01:04   Dg Foot Complete Right  10/13/2014   CLINICAL DATA:  Second toe right foot injury 2 weeks ago  EXAM: RIGHT FOOT COMPLETE - 3+ VIEW  COMPARISON:  None.  FINDINGS: Three views of the right foot submitted. There is avulsion fracture at the tip of distal phalanx second toe. There is soft tissue swelling and soft tissue irregularity second toe.  IMPRESSION: Avulsion fracture at the tip of distal phalanx second toe. Soft tissue swelling noted second toe.   Electronically Signed   By: Lahoma Crocker M.D.   On: 10/13/2014 18:28    Microbiology: Recent Results (from the past 240 hour(s))  Culture, blood (routine x 2)     Status: None   Collection Time: 2014-11-05  9:07 AM  Result Value Ref Range Status   Specimen Description BLOOD RIGHT ARM  Final   Special Requests BOTTLES DRAWN AEROBIC ONLY 10CC AEROBIC ONLY   Final   Culture   Final    NO GROWTH 5 DAYS Performed at [facility]    Report Status 10/30/2014 FINAL  Final  Culture, blood (routine x 2)     Status: None   Collection Time: Nov 05, 2014  9:12 AM  Result Value Ref Range Status   Specimen Description BLOOD RIGHT HAND  Final   Special Requests BOTTLES DRAWN AEROBIC ONLY 5CC AEROBIC ONLY   Final   Culture   Final    NO GROWTH 5 DAYS Performed at [facility]    Report Status 10/30/2014 FINAL  Final  Surgical pcr screen     Status: None   Collection Time: 2014/11/05 10:21 AM  Result Value Ref Range Status   MRSA, PCR NEGATIVE NEGATIVE Final   Staphylococcus aureus NEGATIVE NEGATIVE Final    Comment:        The Xpert SA Assay (FDA approved for NASAL specimens in patients over 58 years of age), is one component of a comprehensive surveillance program.  Test performance has been validated by [facility] for patients greater than or equal to 3 year old. It is not intended to  diagnose infection nor to guide or monitor treatment.   Wound culture     Status: None   Collection Time: 10/24/14 10:27 AM   Result Value Ref Range Status   Specimen Description ABSCESS  Final   Special Requests NONE  Final   Gram Stain   Final    MODERATE WBC PRESENT, PREDOMINANTLY PMN RARE SQUAMOUS EPITHELIAL CELLS PRESENT ABUNDANT GRAM POSITIVE COCCI IN PAIRS Performed at [facility]    Culture   Final    ABUNDANT ENTEROCOCCUS SPECIES Performed at [facility]    Report Status 10/27/2014 FINAL  Final   Organism ID, Bacteria ENTEROCOCCUS SPECIES  Final      Susceptibility   Enterococcus species - MIC*    VANCOMYCIN 1 SENSITIVE Sensitive     AMPICILLIN <=2 SENSITIVE Sensitive     * ABUNDANT ENTEROCOCCUS SPECIES  Anaerobic culture     Status: None   Collection Time: 10/24/14  1:45 PM  Result Value Ref Range Status   Specimen Description TOE RIGHT ABSCESS  Final   Special Requests NONE  Final   Gram Stain   Final    FEW WBC PRESENT, PREDOMINANTLY PMN NO SQUAMOUS EPITHELIAL CELLS SEEN RARE GRAM POSITIVE COCCI IN PAIRS Performed at [facility]    Culture   Final    NO ANAEROBES ISOLATED Performed at [facility]    Report Status 10/29/2014 FINAL  Final  Culture, routine-abscess     Status: None   Collection Time: 10/24/14  1:45 PM  Result Value Ref Range Status   Specimen Description TOE RIGHT ABSCESS  Final   Special Requests NONE  Final   Gram Stain   Final    ABUNDANT WBC PRESENT, PREDOMINANTLY PMN NO SQUAMOUS EPITHELIAL CELLS SEEN ABUNDANT GRAM POSITIVE COCCI IN PAIRS Performed at [facility]    Culture   Final    MODERATE ENTEROCOCCUS SPECIES Performed at [facility]    Report Status 10/27/2014 FINAL  Final   Organism ID, Bacteria ENTEROCOCCUS SPECIES  Final      Susceptibility   Enterococcus species - MIC*    VANCOMYCIN 1 SENSITIVE Sensitive     AMPICILLIN <=2 SENSITIVE Sensitive     * MODERATE ENTEROCOCCUS SPECIES     Labs: Basic Metabolic Panel:  Recent Labs Lab 10/25/14 0302 10/26/14 0445 10/27/14 2205  10/28/14 1813 10/29/14 0520 10/30/14 0543  NA 129* 133*  --  132* 134* 137  K 3.8 3.4*  --  3.3* 3.2* 3.2*  CL 94* 96*  --  95* 98* 103  CO2 26 28  --  _0 GLUCOSE 287* 239*  --  215* 167* 157*  BUN 13 14  --  _1 CREATININE 1.11* 0.94 1.35* 1.99* 1.89* 1.88*  CALCIUM 7.7* 8.2*  --  8.1* 8.1* 7.9*   Liver Function Tests:  Recent Labs Lab 10/23/14 2040  AST 19  ALT 16  ALKPHOS 63  BILITOT 0.2*  PROT 8.3*  ALBUMIN 2.4*   No results for input(s): LIPASE, AMYLASE in the last 168 hours. No results for input(s): AMMONIA in the last 168 hours. CBC:  Recent Labs Lab 10/23/14 2040 10/24/14 0501 10/25/14 0918 10/26/14 0445 10/27/14 2205 10/29/14 0520  WBC 20.0* 15.9* 12.5* 11.2* 9.8 8.5  NEUTROABS 17.7*  --  10.5*  --   --   --   HGB 9.2* 8.1* 8.9* 9.3* 9.6* 9.7*  HCT 27.6* 24.6* 26.5* 27.9* 28.6* 29.5*  MCV  91.7 89.8 89.2 90.0 89.9 90.2  PLT 270 242 217 230 234 237   Cardiac Enzymes: No results for input(s): CKTOTAL, CKMB, CKMBINDEX, TROPONINI in the last 168 hours. BNP: BNP (last 3 results) No results for input(s): BNP in the last 8760 hours.  ProBNP (last 3 results) No results for input(s): PROBNP in the last 8760 hours.  CBG:  Recent Labs Lab 10/29/14 0623 10/29/14 1211 10/29/14 1648 10/29/14 2252 10/30/14 0635  GLUCAP 168* 140* 153* 90 141*       Signed:  Danielys Madry A  [facility] 10/30/2014, 10:38 AM

## 2014-10-30 NOTE — Discharge Planning (Signed)
Patient to be discharged to [facility]. Patient updated at bedside.  Facility: [facility] RN report number: [phone] Transportation: EMS ([address]  Lubertha Sayres, [place] [phone]) and Surgical [phone])

## 2014-10-30 NOTE — Progress Notes (Signed)
Physical Therapy Treatment Patient Details Name: Mallory Potts MRN: [id number] DOB: [birth year] Today's Date: 10/30/2014    History of Present Illness Patient is a 49 y/o female s/p R BKA. PMH includes DM, HTN, seizures, COPD, depression, anxiety, bipolar 1 disorder.    PT Comments    Patient progressing slowly towards PT goals. Pt continues to get anxious during mobility requiring cues for relaxation and breathing cues. Tolerated short distance ambulation with Min A for safety. 1 LOB requiring assist to prevent fall. Will continue to follow to maximize independence and mobility.  Follow Up Recommendations  SNF     Equipment Recommendations  Wheelchair (measurements PT);Wheelchair cushion (measurements PT)    Recommendations for Other Services       Precautions / Restrictions Precautions Precautions: Fall Required Braces or Orthoses: Other Brace/Splint Other Brace/Splint: stump protector RLE Restrictions Weight Bearing Restrictions: Yes RLE Weight Bearing: Non weight bearing    Mobility  Bed Mobility Overal bed mobility: Needs Assistance Bed Mobility: Supine to Sit     Supine to sit: Supervision;HOB elevated     General bed mobility comments: Pt pulled up into long sitting with supervision. No assist needed. Increased time to get to EOB due to increased pain and anxiety.  Transfers Overall transfer level: Needs assistance Equipment used: Rolling walker (2 wheeled) Transfers: Sit to/from Stand Sit to Stand: Min assist         General transfer comment: Min A to rise from EOB with cues for hand placement. Unsteady upon standing. Anxious. Stood from [employer], from chair x1.   Ambulation/Gait Ambulation/Gait assistance: Min assist Ambulation Distance (Feet): 40 Feet (+35') Assistive device: Rolling walker (2 wheeled) Gait Pattern/deviations: Step-to pattern;Decreased stride length   Gait velocity interpretation: Below normal speed for age/gender General Gait Details:  "hop to" gait pattern. Increased WB through Lane. Dyspnea present. 1 instance of knee locking LLE causing LOB anteriorly- MIn A to prevent fall. 1 seated rest break.   Stairs            Wheelchair Mobility    Modified Rankin (Stroke Patients Only)       Balance Overall balance assessment: Needs assistance Sitting-balance support: Feet supported;No upper extremity supported Sitting balance-Leahy Scale: Good     Standing balance support: During functional activity Standing balance-Leahy Scale: Poor                      Cognition Arousal/Alertness: Awake/alert Behavior During Therapy: WFL for tasks assessed/performed Overall Cognitive Status: Within Functional Limits for tasks assessed                      Exercises      General Comments        Pertinent Vitals/Pain Pain Assessment: Faces Faces Pain Scale: Hurts even more Pain Location: right residual limb with movement. Pain Descriptors / Indicators: Guarding;Grimacing;Sore;Moaning Pain Intervention(s): Monitored during session;Repositioned;Utilized relaxation techniques    Home Living                      Prior Function            PT Goals (current goals can now be found in the care plan section) Progress towards PT goals: Progressing toward goals    Frequency  Min 3X/week    PT Plan Current plan remains appropriate    Co-evaluation             End of Session Equipment Utilized During Treatment: Gait  belt Activity Tolerance: Patient tolerated treatment well Patient left: in chair;with call bell/phone within reach;with family/visitor present     Time: [id number] PT Time Calculation (min) (ACUTE ONLY): 25 min  Charges:  $Gait Training: 8-22 mins $Therapeutic Activity: 8-22 mins                    G Codes:      Kresta Templeman A Marchetta Navratil 10/30/2014, 11:33 AM  Wray Kearns, PT, DPT [phone]

## 2014-10-30 NOTE — Progress Notes (Signed)
Per MD order, PICC line removed. Cath intact at 43cm. Vaseline pressure gauze to site, pressure held x 5min. No bleeding to site. Pt instructed to keep dressing CDI x 24 hours. Avoid heavy lifting, pushing or pulling x 24 hours,  If bleeding occurs hold pressure, if bleeding does not stop contact MD or go to the ED. Pt does not have any questions. Tiarrah Saville M  

## 2014-10-30 NOTE — Clinical Social Work Placement (Signed)
   CLINICAL SOCIAL WORK PLACEMENT  NOTE  Date:  10/30/2014  Patient Details  Name: Mallory Potts MRN: [id number] Date of Birth: 11-Jan-1966  Clinical Social Work is seeking post-discharge placement for this patient at the [place] level of care (*CSW will initial, date and re-position this form in  chart as items are completed):  Yes   Patient/family provided with [place] Work Department's list of facilities offering this level of care within the geographic area requested by the patient (or if unable, by the patient's family).  Yes   Patient/family informed of their freedom to choose among providers that offer the needed level of care, that participate in Medicare, Medicaid or managed care program needed by the patient, have an available bed and are willing to accept the patient.  Yes   Patient/family informed of [place]'s ownership interest in [facility] and [facility], as well as of the fact that they are under no obligation to receive care at these facilities.  PASRR submitted to EDS on 10/29/14     PASRR number received on 10/29/14     Existing PASRR number confirmed on       FL2 transmitted to all facilities in geographic area requested by pt/family on 10/29/14     FL2 transmitted to all facilities within larger geographic area on       Patient informed that his/her managed care company has contracts with or will negotiate with certain facilities, including the following:        Yes   Patient/family informed of bed offers received.  Patient chooses bed at [facility]     Physician recommends and patient chooses bed at      Patient to be transferred to [facility] on 10/30/14.  Patient to be transferred to facility by [facility]     Patient family notified on 10/30/14 of transfer.  Name of family member notified:  Patient updated at bedside.     PHYSICIAN Please prepare priority discharge summary, including medications      Additional Comment:    _______________________________________________ Caroline Sauger, LCSW 10/30/2014, 3:23 PM [phone]

## 2014-11-02 ENCOUNTER — Non-Acute Institutional Stay (SKILLED_NURSING_FACILITY): Payer: Medicare Other | Admitting: Adult Health

## 2014-11-02 ENCOUNTER — Encounter: Payer: Self-pay | Admitting: Adult Health

## 2014-11-02 DIAGNOSIS — J449 Chronic obstructive pulmonary disease, unspecified: Secondary | ICD-10-CM

## 2014-11-02 DIAGNOSIS — G629 Polyneuropathy, unspecified: Secondary | ICD-10-CM | POA: Diagnosis not present

## 2014-11-02 DIAGNOSIS — E1169 Type 2 diabetes mellitus with other specified complication: Secondary | ICD-10-CM

## 2014-11-02 DIAGNOSIS — G4700 Insomnia, unspecified: Secondary | ICD-10-CM | POA: Diagnosis not present

## 2014-11-02 DIAGNOSIS — J309 Allergic rhinitis, unspecified: Secondary | ICD-10-CM | POA: Diagnosis not present

## 2014-11-02 DIAGNOSIS — E1165 Type 2 diabetes mellitus with hyperglycemia: Secondary | ICD-10-CM

## 2014-11-02 DIAGNOSIS — L089 Local infection of the skin and subcutaneous tissue, unspecified: Secondary | ICD-10-CM

## 2014-11-02 DIAGNOSIS — K5900 Constipation, unspecified: Secondary | ICD-10-CM | POA: Diagnosis not present

## 2014-11-02 DIAGNOSIS — F319 Bipolar disorder, unspecified: Secondary | ICD-10-CM | POA: Diagnosis not present

## 2014-11-02 DIAGNOSIS — I10 Essential (primary) hypertension: Secondary | ICD-10-CM | POA: Diagnosis not present

## 2014-11-02 DIAGNOSIS — E039 Hypothyroidism, unspecified: Secondary | ICD-10-CM | POA: Diagnosis not present

## 2014-11-02 DIAGNOSIS — E43 Unspecified severe protein-calorie malnutrition: Secondary | ICD-10-CM | POA: Diagnosis not present

## 2014-11-02 DIAGNOSIS — IMO0002 Reserved for concepts with insufficient information to code with codable children: Secondary | ICD-10-CM

## 2014-11-02 DIAGNOSIS — D509 Iron deficiency anemia, unspecified: Secondary | ICD-10-CM

## 2014-11-02 DIAGNOSIS — E11628 Type 2 diabetes mellitus with other skin complications: Secondary | ICD-10-CM

## 2014-11-03 ENCOUNTER — Encounter: Payer: Self-pay | Admitting: Internal Medicine

## 2014-11-03 ENCOUNTER — Encounter (HOSPITAL_BASED_OUTPATIENT_CLINIC_OR_DEPARTMENT_OTHER): Payer: No Typology Code available for payment source | Attending: General Surgery

## 2014-11-03 ENCOUNTER — Non-Acute Institutional Stay (SKILLED_NURSING_FACILITY): Payer: Medicare Other | Admitting: Internal Medicine

## 2014-11-03 DIAGNOSIS — D509 Iron deficiency anemia, unspecified: Secondary | ICD-10-CM | POA: Diagnosis not present

## 2014-11-03 DIAGNOSIS — K219 Gastro-esophageal reflux disease without esophagitis: Secondary | ICD-10-CM

## 2014-11-03 DIAGNOSIS — J449 Chronic obstructive pulmonary disease, unspecified: Secondary | ICD-10-CM | POA: Diagnosis not present

## 2014-11-03 DIAGNOSIS — E039 Hypothyroidism, unspecified: Secondary | ICD-10-CM | POA: Diagnosis not present

## 2014-11-03 DIAGNOSIS — F319 Bipolar disorder, unspecified: Secondary | ICD-10-CM

## 2014-11-03 DIAGNOSIS — K5900 Constipation, unspecified: Secondary | ICD-10-CM

## 2014-11-03 DIAGNOSIS — L089 Local infection of the skin and subcutaneous tissue, unspecified: Secondary | ICD-10-CM | POA: Diagnosis not present

## 2014-11-03 DIAGNOSIS — E1169 Type 2 diabetes mellitus with other specified complication: Secondary | ICD-10-CM | POA: Diagnosis not present

## 2014-11-03 DIAGNOSIS — E46 Unspecified protein-calorie malnutrition: Secondary | ICD-10-CM | POA: Diagnosis not present

## 2014-11-03 DIAGNOSIS — E1165 Type 2 diabetes mellitus with hyperglycemia: Secondary | ICD-10-CM

## 2014-11-03 DIAGNOSIS — IMO0002 Reserved for concepts with insufficient information to code with codable children: Secondary | ICD-10-CM

## 2014-11-03 DIAGNOSIS — I10 Essential (primary) hypertension: Secondary | ICD-10-CM

## 2014-11-03 DIAGNOSIS — E11628 Type 2 diabetes mellitus with other skin complications: Secondary | ICD-10-CM

## 2014-11-03 NOTE — Progress Notes (Signed)
Patient ID: Mallory Potts, female   DOB: 27-Jul-1965, 49 y.o.   MRN: [id number]     [facility]  PCP: Arnoldo Morale, MD  Code Status: Full Code   Allergies  Allergen Reactions  . Lithium Shortness Of Breath  . Milk-Related Compounds Nausea And Vomiting  . Other     Bell peppers. Mouth sores.    . Abilify [Aripiprazole] Rash  . Aripiprazole Rash    Chief Complaint  Patient presents with  . New Admit To SNF    New Admission      HPI:  49 year old patient is here for short term rehabilitation post hospital admission from 10/23/14-10/30/14 with right foot cellulitis. She was noted to have right second toe osteomyelitis with gangrene. She underwent right second toe amputation on 10/24/14 and then below knee amputation on 10/27/14. She is seen in her room today. She complaints of itching in her buttock area and vaginal area. Denies dysuria.  She has pmh of dm type 2, copd, bipolar disorder, OCD, HTN and obesity  Review of Systems:  Constitutional: Negative for fever, chills, diaphoresis.  HENT: Negative for headache, congestion, nasal discharge, hearing loss, earache, sore throat, difficulty swallowing.   Eyes: Negative for eye pain, blurred vision, double vision and discharge.  Respiratory: Negative for cough, shortness of breath and wheezing.   Cardiovascular: Negative for chest pain, palpitations, leg swelling.  Gastrointestinal: Negative for heartburn, nausea, vomiting, abdominal pain. Appetite is good. Last bowel movement 2 days back. Emmaline Kluver helps regulating her bowel. Genitourinary: Negative for dysuria Musculoskeletal: Negative for back pain, falls Skin: Negative for itching,  rash.  Neurological: Negative for dizziness, tingling, focal weakness Psychiatric/Behavioral: Negative for depression.    Past Medical History  Diagnosis Date  . Diabetes mellitus   . Asthma   . Arthritis   . Hypertension   . Epilepsy   . Seizures   . Thyroid disease   . COPD (chronic  obstructive pulmonary disease)   . Bipolar 1 disorder   . Peripheral edema   . OCD (obsessive compulsive disorder)   . COPD (chronic obstructive pulmonary disease)   . Depression   . Anxiety    Past Surgical History  Procedure Laterality Date  . Tonsillectomy    . Knee arthroscopy    . Amputation Right 10/24/2014    Procedure: AMPUTATION RAY RIGHT SECOND TOE;  Surgeon: Netta Cedars, MD;  Location: [facility];  Service: Orthopedics;  Laterality: Right;  . Amputation Right 10/27/2014    Procedure: RIGHT BELOW KNEE AMPUTATION;  Surgeon: [name], MD;  Location: [facility];  Service: Orthopedics;  Laterality: Right;   Social History:   reports that she has been smoking Cigarettes.  She has a 2.5 pack-year smoking history. She does not have any smokeless tobacco history on file. She reports that she does not drink alcohol or use illicit drugs.  Family History  Problem Relation Age of Onset  . Asthma Other   . Diabetes Other   . Hypertension Other     Medications: Patient's Medications  New Prescriptions   No medications on file  Previous Medications   ACETAMINOPHEN (TYLENOL) 500 MG TABLET    Take 1,000 mg by mouth every 6 (six) hours as needed for mild pain, moderate pain or headache.   ALBUTEROL (PROVENTIL HFA;VENTOLIN HFA) 108 (90 BASE) MCG/ACT INHALER    Inhale 2 puffs into the lungs every 6 (six) hours as needed for wheezing. Breathing   ALBUTEROL (PROVENTIL) (2.5 MG/3ML) 0.083%  NEBULIZER SOLUTION    Take 2.5 mg by nebulization 2 times daily at 12 noon and 4 pm.   CARBAMAZEPINE (TEGRETOL) 200 MG TABLET    Take 200-400 mg by mouth 2 (two) times daily. Take 200 mg every morning and 400 mg at bedtime.   CETIRIZINE (ZYRTEC) 10 MG TABLET    Take 10 mg by mouth daily.   CHLORTHALIDONE (HYGROTON) 25 MG TABLET    Take 25 mg by mouth daily.   DOCUSATE SODIUM (COLACE) 100 MG CAPSULE    Take 1 capsule (100 mg total) by mouth 2 (two) times daily.   FERROUS SULFATE 325 (65 FE) MG TABLET    Take 1  tablet (325 mg total) by mouth 2 (two) times daily with a meal.   FLUTICASONE (FLONASE) 50 MCG/ACT NASAL SPRAY    Place 1 spray into both nostrils daily.   GABAPENTIN (NEURONTIN) 100 MG CAPSULE    Take 100 mg by mouth 2 (two) times daily.   INSULIN ASPART (NOVOLOG) 100 UNIT/ML INJECTION    Inject 4 Units into the skin 3 (three) times daily with meals.   INSULIN GLARGINE (LANTUS) 100 UNIT/ML INJECTION    Inject 0.22 mLs (22 Units total) into the skin at bedtime.   LEVOTHYROXINE (SYNTHROID, LEVOTHROID) 50 MCG TABLET    Take 50 mcg by mouth daily before breakfast.   OMEGA-3 FATTY ACIDS (FISH OIL) 1000 MG CAPS    Take 1,000 mg by mouth 2 (two) times daily.   OMEPRAZOLE (PRILOSEC) 20 MG CAPSULE    Take 20 mg by mouth daily.   TIOTROPIUM (SPIRIVA) 18 MCG INHALATION CAPSULE    Place 18 mcg into inhaler and inhale daily.   TRAZODONE (DESYREL) 50 MG TABLET    Take 50 mg by mouth at bedtime.  Modified Medications   No medications on file  Discontinued Medications   HYDROCODONE-ACETAMINOPHEN (NORCO) 5-325 MG PER TABLET    Take 1 tablet by mouth every 6 (six) hours as needed.     Physical Exam: Filed Vitals:   11/03/14 0934  BP: 118/60  Pulse: 70  Temp: 97 F (36.1 C)  TempSrc: Oral  Resp: 18  Height: 5\' 5"  (1.651 m)  Weight: 238 lb (107.956 kg)  SpO2: 98%    General- obese adult female, in no acute distress Head- normocephalic, atraumatic Throat- moist mucus membrane Eyes- PERRLA, EOMI, no pallor, no icterus, no discharge, normal conjunctiva, normal sclera Neck- no cervical lymphadenopathy Cardiovascular- normal s1,s2, no murmurs, palpable left dorsalis pedis and both radial pulses, leg edema trace in left leg Respiratory- bilateral clear to auscultation, no wheeze, no rhonchi, no crackles, no use of accessory muscles Abdomen- bowel sounds present, soft, non tender Musculoskeletal- able to move all 4 extremities, right BKA with dressing in place Neurological- no focal deficit Skin- warm  and dry Psychiatry- alert and oriented to person, place and time, normal mood and affect    Labs reviewed: Basic Metabolic Panel:  Recent Labs  10/28/14 1813 10/29/14 0520 10/30/14 0543  NA 132* 134* 137  K 3.3* 3.2* 3.2*  CL 95* 98* 103  CO2 27 26 26   GLUCOSE 215* 167* 157*  BUN 15 13 14   CREATININE 1.99* 1.89* 1.88*  CALCIUM 8.1* 8.1* 7.9*   Liver Function Tests:  Recent Labs  10/23/14 2040  AST 19  ALT 16  ALKPHOS 63  BILITOT 0.2*  PROT 8.3*  ALBUMIN 2.4*   No results for input(s): LIPASE, AMYLASE in the last 8760 hours. No results for  input(s): AMMONIA in the last 8760 hours. CBC:  Recent Labs  10/23/14 2040  10/25/14 0918 10/26/14 0445 10/27/14 2205 10/29/14 0520  WBC 20.0*  < > 12.5* 11.2* 9.8 8.5  NEUTROABS 17.7*  --  10.5*  --   --   --   HGB 9.2*  < > 8.9* 9.3* 9.6* 9.7*  HCT 27.6*  < > 26.5* 27.9* 28.6* 29.5*  MCV 91.7  < > 89.2 90.0 89.9 90.2  PLT 270  < > 217 230 234 237  < > = values in this interval not displayed. Cardiac Enzymes: No results for input(s): CKTOTAL, CKMB, CKMBINDEX, TROPONINI in the last 8760 hours. BNP: Invalid input(s): POCBNP CBG:  Recent Labs  10/29/14 2252 10/30/14 0635 10/30/14 1235  GLUCAP 90 141* 125*     Assessment/Plan  Right toe gangrene S/p right BKA. Pain under control. Continue neurontin 100 mg bid. Will have patient work with PT/OT as tolerated to regain strength and restore function.  Fall precautions are in place. Has f/u with orthopedics.   Protein calorie malnutrition To have dietary team evaluate her further for nutritional supplements. Monitor weight and po intake  Constipation On colace 100 mg bid  Iron def anemia Continue ferrous sulfate 325 mg bid with meals, monitor cbc  Bipolar disorder Continue home regimen tegretol 200 mg daily am and 400 mg in pm  HTN bp stable, continue hygroton 25 mg daily  gerd Stable, continue prilosec 20 mg daily  Dm type 2 Monitor cbg, continue  lantus 22 u daily with humalog 4 u with meals.   Hypothyroidism Continue levothyroxine 50 mcg daily  Copd Stable, continue spiriva and prn albuterol   Goals of care: short term rehabilitation   Labs/tests ordered: cbc, bmp  Family/ staff Communication: reviewed care plan with patient and nursing supervisor    Blanchie Serve, MD  [facility] [phone] (Monday-Friday 8 am - 5 pm) [phone] (afterhours)

## 2014-11-13 ENCOUNTER — Encounter: Payer: Self-pay | Admitting: Adult Health

## 2014-11-13 ENCOUNTER — Non-Acute Institutional Stay (SKILLED_NURSING_FACILITY): Payer: Medicare Other | Admitting: Adult Health

## 2014-11-13 DIAGNOSIS — J449 Chronic obstructive pulmonary disease, unspecified: Secondary | ICD-10-CM | POA: Diagnosis not present

## 2014-11-13 DIAGNOSIS — G629 Polyneuropathy, unspecified: Secondary | ICD-10-CM

## 2014-11-13 DIAGNOSIS — K5900 Constipation, unspecified: Secondary | ICD-10-CM

## 2014-11-13 DIAGNOSIS — D509 Iron deficiency anemia, unspecified: Secondary | ICD-10-CM | POA: Diagnosis not present

## 2014-11-13 DIAGNOSIS — IMO0002 Reserved for concepts with insufficient information to code with codable children: Secondary | ICD-10-CM

## 2014-11-13 DIAGNOSIS — G4700 Insomnia, unspecified: Secondary | ICD-10-CM | POA: Diagnosis not present

## 2014-11-13 DIAGNOSIS — I10 Essential (primary) hypertension: Secondary | ICD-10-CM | POA: Diagnosis not present

## 2014-11-13 DIAGNOSIS — E039 Hypothyroidism, unspecified: Secondary | ICD-10-CM | POA: Diagnosis not present

## 2014-11-13 DIAGNOSIS — J309 Allergic rhinitis, unspecified: Secondary | ICD-10-CM

## 2014-11-13 DIAGNOSIS — R569 Unspecified convulsions: Secondary | ICD-10-CM | POA: Insufficient documentation

## 2014-11-13 DIAGNOSIS — E1165 Type 2 diabetes mellitus with hyperglycemia: Secondary | ICD-10-CM | POA: Diagnosis not present

## 2014-11-13 DIAGNOSIS — E11628 Type 2 diabetes mellitus with other skin complications: Secondary | ICD-10-CM

## 2014-11-13 DIAGNOSIS — F319 Bipolar disorder, unspecified: Secondary | ICD-10-CM

## 2014-11-13 DIAGNOSIS — E1169 Type 2 diabetes mellitus with other specified complication: Secondary | ICD-10-CM | POA: Diagnosis not present

## 2014-11-13 DIAGNOSIS — L089 Local infection of the skin and subcutaneous tissue, unspecified: Secondary | ICD-10-CM

## 2014-11-13 DIAGNOSIS — E46 Unspecified protein-calorie malnutrition: Secondary | ICD-10-CM | POA: Diagnosis not present

## 2014-11-13 NOTE — Progress Notes (Addendum)
Patient ID: Mallory Potts, female   DOB: 1966-01-07, 49 y.o.   MRN: [id number]   11/13/14  Facility:  Nursing Home Location:  [place] Room Number: [phone] LEVEL OF CARE:  SNF (31)   Chief Complaint  Patient presents with  . Discharge Note    Diabetic infection of right foot S/P BKA, anemia, bipolar disorder, COPD, hypertension, allergic rhinitis, hypothyroidism, neuropathy, diabetes mellitus, insomnia, constipation and protein calorie malnutrition     HISTORY OF PRESENT ILLNESS:  This is a 49 year old female who is for discharge home with Home health PT for bilateral lower extremity strengthening and gait balance; OT for self care and home management; and Nursing for disease management. DME:  16" X 18" wheelchair with anti-tippers, elevating leg rests, wheelchair cushion, RLE residual limb leg rest.  She has been admitted to [facility] on 10/30/14 from [facility]. She has PMH of diabetes mellitus, COPD, hypertension, bipolar disorder and OCD. She was having worsening right foot swelling and redness despite over 7 days of by mouth clindamycin. She reports having stubbed her right second toe which ripped some skin off. The toe turned blackish and redness was above her ankle. She had I/D of foot and amputation of right second toe on 6/4. Orthopedic surgery recommended for further amputation. Right BKA was done on 6/7.  Patient was admitted to this facility for short-term rehabilitation after the patient's recent hospitalization.  Patient has completed SNF rehabilitation and therapy has cleared the patient for discharge.   PAST MEDICAL HISTORY:  Past Medical History  Diagnosis Date  . Diabetes mellitus   . Asthma   . Arthritis   . Hypertension   . Epilepsy   . Seizures   . Thyroid disease   . COPD (chronic obstructive pulmonary disease)   . Bipolar 1 disorder   . Peripheral edema   . OCD (obsessive compulsive disorder)   . COPD (chronic  obstructive pulmonary disease)   . Depression   . Anxiety     CURRENT MEDICATIONS: Reviewed per MAR/see medication list  Allergies  Allergen Reactions  . Lithium Shortness Of Breath  . Milk-Related Compounds Nausea And Vomiting  . Other     Bell peppers. Mouth sores.    . Abilify [Aripiprazole] Rash  . Aripiprazole Rash     REVIEW OF SYSTEMS:  GENERAL: no change in appetite, no fatigue, no weight changes, no fever, chills or weakness RESPIRATORY: no cough, SOB, DOE, wheezing, hemoptysis CARDIAC: no chest pain, or palpitations GI: no abdominal pain, diarrhea, constipation, heart burn, nausea or vomiting  PHYSICAL EXAMINATION  GENERAL: no acute distress, obese EYES: conjunctivae normal, sclerae normal, normal eye lids NECK: supple, trachea midline, no neck masses, no thyroid tenderness, no thyromegaly LYMPHATICS: no LAN in the neck, no supraclavicular LAN RESPIRATORY: breathing is even & unlabored, BS CTAB CARDIAC: RRR, no murmur,no extra heart sounds, LLE edema 1+ GI: abdomen soft, normal BS, no masses, no tenderness, no hepatomegaly, no splenomegaly EXTREMITIES:  Right BKA with shrinker, able to move all 4 extremities PSYCHIATRIC: the patient is alert & oriented to person, affect & behavior appropriate  LABS/RADIOLOGY: Labs reviewed: 11/04/14  WBC 5.4 hemoglobin 8.9 hematocrit 27.1 MCV 88.0 platelet 252 sodium 137 potassium 3.7 glucose 141 BUN 24 creatinine 1.89 calcium 8.1 TSH 0000000 Basic Metabolic Panel:  Recent Labs  10/28/14 1813 10/29/14 0520 10/30/14 0543  NA 132* 134* 137  K 3.3* 3.2* 3.2*  CL 95* 98* 103  CO2 27 26 26  GLUCOSE 215* 167* 157*  BUN 15 13 14   CREATININE 1.99* 1.89* 1.88*  CALCIUM 8.1* 8.1* 7.9*   Liver Function Tests:  Recent Labs  10/23/14 2040  AST 19  ALT 16  ALKPHOS 63  BILITOT 0.2*  PROT 8.3*  ALBUMIN 2.4*   CBC:  Recent Labs  10/23/14 2040  10/25/14 0918 10/26/14 0445 10/27/14 2205 10/29/14 0520  WBC 20.0*  < >  12.5* 11.2* 9.8 8.5  NEUTROABS 17.7*  --  10.5*  --   --   --   HGB 9.2*  < > 8.9* 9.3* 9.6* 9.7*  HCT 27.6*  < > 26.5* 27.9* 28.6* 29.5*  MCV 91.7  < > 89.2 90.0 89.9 90.2  PLT 270  < > 217 230 234 237  < > = values in this interval not displayed.  CBG:  Recent Labs  10/29/14 2252 10/30/14 0635 10/30/14 1235  GLUCAP 90 141* 125*    Dg Foot Complete Right  10/24/2014   CLINICAL DATA:  Cellulitis of the foot. Second toe fracture 2 weeks prior.  EXAM: RIGHT FOOT COMPLETE - 3+ VIEW  COMPARISON:  Radiographs 10/13/2014  FINDINGS: Avulsion fracture from the distal seconds tuft, unchanged in appearance. No acute fracture. 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 No frank osseous erosions or periosteal reaction. Plantar calcaneal spur and Achilles tendon enthesophyte again seen. There is progressive soft tissue edema about the foot. No radiographic findings of soft tissue air or foreign body.  IMPRESSION: 1. Avulsion fracture of the distal second toe, unchanged in alignment. 2. Increased soft tissue edema about the foot. No radiographic findings of osteomyelitis or soft tissue air.   Electronically Signed   By: Jeb Levering M.D.   On: 10/24/2014 01:04    ASSESSMENT/PLAN:  Diabetic infection of right foot S/P right BKA - for Home health PT, OT and Nursing; follow-up with Dr. [name], orthopedic surgery  Hypertension - well-controlled; continue Hygroton 25 mg 1 tab by mouth daily  Anemia, iron deficiency - hemoglobin 8.9; continue iron 325 mg 1 tab by mouth twice a day; will re-check CBC  Bipolar disorder - continue Tegretol 200 mg by mouth every morning and Tegretol 400 mg by mouth daily at bedtime  COPD - continue albuterol neb every 1200 and and 1600  Allergic rhinitis - continue Flonase 50 g/ACT 1 spray in nares daily and Zyrtec 10 mg 1 tab by mouth daily  Hypothyroidism - tsh 4.032; continue Synthroid 50 g 1 tab by mouth daily  Neuropathy - continue Neurontin  100 mg by mouth twice a day  Diabetes mellitus, type II - hemoglobin A1c 11.1; continue Lantus 22 units subcutaneous daily at bedtime, NovoLog sliding scale subcutaneous 3 times a day before meals and NovoLog 4 units subcutaneous 3 times a day with meals; Janumet and metformin on hold due to elevated creatinine 1.89  Insomnia - continue Desyrel 50 mg 1 tab by mouth daily at bedtime and Desyrel 50 mg 1 PO Q HS PRN  Constipation - continue Colace 100 mg by mouth twice a day  Protein calorie malnutrition, severe - albumin 2.4; continue supplementation   I have filled out patient's discharge paperwork and written prescriptions.  Patient will receive home health PT, OT and Nursing.  DME provided:  16" X 18" wheelchair with anti-tippers, elevating leg rests, wheelchair cushion, RLE residual limb leg rest  Total discharge time: Greater than 30 minutes  Discharge time involved coordination of the discharge process with Education officer, museum, nursing staff  and therapy department. Medical justification for home health services/DME verified.    [facility], NP [employer] [phone]

## 2014-11-13 NOTE — Progress Notes (Signed)
Patient ID: Mallory Potts, female   DOB: Mar 17, 1966, 49 y.o.   MRN: [id number]   11/02/14  Facility:  Nursing Home Location:  [place] Room Number: [phone] LEVEL OF CARE:  SNF (31)   Chief Complaint  Patient presents with  . 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 She has PMH of diabetes mellitus, COPD, hypertension, bipolar disorder and OCD. She was having worsening right foot swelling and redness despite over 7 days of by mouth clindamycin. She reports having stubbed her right second toe which ripped some skin off. The toe turned blackish and redness was above her ankle. She had I/D of foot and amputation of right second toe on 6/4. Orthopedic surgery recommended for further amputation. Right BKA was done on 6/7.  She has been admitted for a short-term rehabilitation.  PAST MEDICAL HISTORY:  Past Medical History  Diagnosis Date  . Diabetes mellitus   . Asthma   . Arthritis   . Hypertension   . Epilepsy   . Seizures   . Thyroid disease   . COPD (chronic obstructive pulmonary disease)   . Bipolar 1 disorder   . Peripheral edema   . OCD (obsessive compulsive disorder)   . COPD (chronic obstructive pulmonary disease)   . Depression   . Anxiety     CURRENT MEDICATIONS: Reviewed per MAR/see medication list  Allergies  Allergen Reactions  . Lithium Shortness Of Breath  . Milk-Related Compounds Nausea And Vomiting  . Other     Bell peppers. Mouth sores.    . Abilify [Aripiprazole] Rash  . Aripiprazole Rash     REVIEW OF SYSTEMS:  GENERAL: no change in appetite, no fatigue, no weight changes, no fever, chills or  weakness RESPIRATORY: no cough, SOB, DOE, wheezing, hemoptysis CARDIAC: no chest pain, or palpitations GI: no abdominal pain, diarrhea, constipation, heart burn, nausea or vomiting  PHYSICAL EXAMINATION  GENERAL: no acute distress, obese EYES: conjunctivae normal, sclerae normal, normal eye lids NECK: supple, trachea midline, no neck masses, no thyroid tenderness, no thyromegaly LYMPHATICS: no LAN in the neck, no supraclavicular LAN RESPIRATORY: breathing is even & unlabored, BS CTAB CARDIAC: RRR, no murmur,no extra heart sounds, LLE edema trace GI: abdomen soft, normal BS, no masses, no tenderness, no hepatomegaly, no splenomegaly EXTREMITIES:  Right BKA with dressing, able to move all 4 extremities PSYCHIATRIC: the patient is alert & oriented to person, affect & behavior appropriate  LABS/RADIOLOGY: Labs reviewed: Basic Metabolic Panel:  Recent Labs  10/28/14 1813 10/29/14 0520 10/30/14 0543  NA 132* 134* 137  K 3.3* 3.2* 3.2*  CL 95* 98* 103  CO2 27 26 26   GLUCOSE 215* 167* 157*  BUN 15 13 14   CREATININE 1.99* 1.89* 1.88*  CALCIUM 8.1* 8.1* 7.9*   Liver Function Tests:  Recent Labs  10/23/14 2040  AST 19  ALT 16  ALKPHOS 63  BILITOT 0.2*  PROT 8.3*  ALBUMIN 2.4*   CBC:  Recent Labs  10/23/14 2040  10/25/14 0918 10/26/14 0445 10/27/14 2205 10/29/14 0520  WBC 20.0*  < > 12.5* 11.2* 9.8 8.5  NEUTROABS 17.7*  --  10.5*  --   --   --   HGB  9.2*  < > 8.9* 9.3* 9.6* 9.7*  HCT 27.6*  < > 26.5* 27.9* 28.6* 29.5*  MCV 91.7  < > 89.2 90.0 89.9 90.2  PLT 270  < > 217 230 234 237  < > = values in this interval not displayed.  CBG:  Recent Labs  10/29/14 2252 10/30/14 0635 10/30/14 1235  GLUCAP 90 141* 125*    Dg Foot Complete Right  10/24/2014   CLINICAL DATA:  Cellulitis of the foot. Second toe fracture 2 weeks prior.  EXAM: RIGHT FOOT COMPLETE - 3+ VIEW  COMPARISON:  Radiographs 10/13/2014  FINDINGS: Avulsion fracture from the distal seconds tuft,  unchanged in appearance. No acute fracture. 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 No frank osseous erosions or periosteal reaction. Plantar calcaneal spur and Achilles tendon enthesophyte again seen. There is progressive soft tissue edema about the foot. No radiographic findings of soft tissue air or foreign body.  IMPRESSION: 1. Avulsion fracture of the distal second toe, unchanged in alignment. 2. Increased soft tissue edema about the foot. No radiographic findings of osteomyelitis or soft tissue air.   Electronically Signed   By: Jeb Levering M.D.   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 [name], orthopedic surgery, in 2 weeks  Hypertension - well-controlled; continue Hygro[place] 25 mg 1 tab by mouth daily  Anemia, iron deficiency - hemoglobin 9.7; continue iron 325 mg 1 tab by mouth twice a day  Bipolar disorder - continue Tegretol 200 mg by mouth every morning and Tegretol 400 mg by mouth daily at bedtime  COPD - continue albuterol neb every 1200 and and 1600  Allergic rhinitis - continue Flonase 50 g/ACT 1 spray in nares daily and Zyrtec 10 mg 1 tab by mouth daily  Hypothyroidism - continue Synthroid 50 g 1 tab by mouth daily  Neuropathy - continue Neurontin 100 mg by mouth twice a day  Diabetes mellitus, type II - hemoglobin A1c 11.1; continue Lantus 22 units subcutaneous daily at bedtime, NovoLog sliding scale subcutaneous 3 times a day before meals and NovoLog 4 units subcutaneous 3 times a day with meals; Janumet and metformin on hold due to elevated creatinine 1.88  Insomnia - continue Desyrel 50 mg 1 tab by mouth daily at bedtime  Constipation - continue Colace 100 mg by mouth twice a day  Protein calorie malnutrition, severe - albumin 2.4; RD consult    Goals of care:  Short-term rehabilitation   Labs/test ordered:  CBC, BMP and TSH   Spent 50 minutes in  patient care.     [facility], NP [employer] [phone]

## 2015-02-15 ENCOUNTER — Ambulatory Visit: Payer: Medicare Other | Attending: Orthopedic Surgery | Admitting: Physical Therapy

## 2015-02-15 ENCOUNTER — Encounter: Payer: Self-pay | Admitting: Physical Therapy

## 2015-02-15 DIAGNOSIS — Z5189 Encounter for other specified aftercare: Secondary | ICD-10-CM | POA: Insufficient documentation

## 2015-02-15 DIAGNOSIS — R269 Unspecified abnormalities of gait and mobility: Secondary | ICD-10-CM | POA: Insufficient documentation

## 2015-02-15 DIAGNOSIS — Z7409 Other reduced mobility: Secondary | ICD-10-CM

## 2015-02-15 DIAGNOSIS — Z89511 Acquired absence of right leg below knee: Secondary | ICD-10-CM | POA: Diagnosis present

## 2015-02-15 DIAGNOSIS — M623 Immobility syndrome (paraplegic): Secondary | ICD-10-CM | POA: Insufficient documentation

## 2015-02-15 DIAGNOSIS — R6889 Other general symptoms and signs: Secondary | ICD-10-CM | POA: Diagnosis present

## 2015-02-15 DIAGNOSIS — R2689 Other abnormalities of gait and mobility: Secondary | ICD-10-CM

## 2015-02-15 DIAGNOSIS — R531 Weakness: Secondary | ICD-10-CM | POA: Insufficient documentation

## 2015-02-15 DIAGNOSIS — M2560 Stiffness of unspecified joint, not elsewhere classified: Secondary | ICD-10-CM

## 2015-02-15 DIAGNOSIS — R29818 Other symptoms and signs involving the nervous system: Secondary | ICD-10-CM | POA: Diagnosis present

## 2015-02-15 DIAGNOSIS — R2681 Unsteadiness on feet: Secondary | ICD-10-CM | POA: Diagnosis present

## 2015-02-15 DIAGNOSIS — Z4789 Encounter for other orthopedic aftercare: Secondary | ICD-10-CM

## 2015-02-15 NOTE — Therapy (Signed)
[facility] [address] [place], [place], [address] Phone: [phone]   Fax:  [phone]  Physical Therapy Evaluation  Patient Details  Name: Mallory Potts MRN: [id number] Date of Birth: Aug 13, 1965 Referring Provider:  [name], MD  Encounter Date: 02/15/2015      PT End of Session - 02/15/15 1100    Visit Number 1   Number of Visits 18   Date for PT Re-Evaluation 04/16/15   Authorization Type G-Code & progress report every 10th visit   PT Start Time 1050   PT Stop Time 1140   PT Time Calculation (min) 50 min   Equipment Utilized During Treatment Gait belt   Activity Tolerance Patient tolerated treatment well   Behavior During Therapy [facility] for tasks assessed/performed      Past Medical History  Diagnosis Date  . Diabetes mellitus   . Asthma   . Arthritis   . Hypertension   . Epilepsy   . Seizures   . Thyroid disease   . COPD (chronic obstructive pulmonary disease)   . Bipolar 1 disorder   . Peripheral edema   . OCD (obsessive compulsive disorder)   . COPD (chronic obstructive pulmonary disease)   . Depression   . Anxiety     Past Surgical History  Procedure Laterality Date  . Tonsillectomy    . Knee arthroscopy    . Amputation Right 10/24/2014    Procedure: AMPUTATION RAY RIGHT SECOND TOE;  Surgeon: Netta Cedars, MD;  Location: [facility];  Service: Orthopedics;  Laterality: Right;  . Amputation Right 10/27/2014    Procedure: RIGHT BELOW KNEE AMPUTATION;  Surgeon: [name], MD;  Location: [facility];  Service: Orthopedics;  Laterality: Right;    There were no vitals filed for this visit.  Visit Diagnosis:  Abnormality of gait  Unsteadiness  Balance problems  Decreased functional activity tolerance  Weakness generalized  Stiffness due to immobility  Status post below knee amputation of right lower extremity  Encounter for prosthetic gait training      Subjective Assessment - 02/15/15 1104     Subjective This 49yo female underwent a right Transtibial Amputation 10/22/2014. She recieved her prosthesis 02/10/2015. She presents to PT for evaluation & prosthetic training.    Patient Stated Goals to be able to walk in home & community, possibly dance   Currently in Pain? Yes   Pain Score 6   in last week, worst 9/10, best 3/10   Pain Location Back   Pain Orientation Lower   Pain Descriptors / Indicators Aching;Burning;Stabbing   Pain Type Chronic pain   Pain Onset More than a month ago   Pain Frequency Constant   Aggravating Factors  sitting around alot, moving more than normal   Pain Relieving Factors laying on left side,    Effect of Pain on Daily Activities limits activities to a point   Multiple Pain Sites Yes   Pain Score 6  in last week, best 3/10, worst 8/10   Pain Location Knee   Pain Orientation Left   Pain Descriptors / Indicators Stabbing;Radiating   Pain Type Chronic pain   Pain Onset More than a month ago   Pain Frequency Constant   Aggravating Factors  standing too much,    Pain Relieving Factors sitting down and placing it up /supported on object            [facility] PT Assessment - 02/15/15 1100    Assessment   Medical Diagnosis Right  Transtibial Amputation prosthesis   Onset Date/Surgical Date 02/10/15  prosthesis delivery   Precautions   Precautions Fall   Restrictions   Weight Bearing Restrictions No   Balance Screen   Has the patient fallen in the past 6 months Yes   How many times? 2  w/c moved & BSC tipped over   Has the patient had a decrease in activity level because of a fear of falling?  Yes  modifies a little   Is the patient reluctant to leave their home because of a fear of falling?  No  uses w/c   Home Environment   Living Environment Private residence   Living Arrangements Non-relatives/Friends  4 friends   Type of [place] One level  4" threshold with ramp into her bedroom & 2nd to  [employer] - 2 wheels;Walker - 4 wheels;Cane - single point;Bedside commode;Wheelchair - manual   Prior Function   Level of Independence Independent with community mobility with device;Independent with household mobility with device;Independent;Independent with gait;Independent with transfers   Vocation On disability   Leisure dancing, bowling   Posture/Postural Control   Posture/Postural Control Postural limitations   Postural Limitations Forward head;Rounded Shoulders;Increased lumbar lordosis;Increased thoracic kyphosis;Flexed trunk;Weight shift left   ROM / Strength   AROM / PROM / Strength Strength   Strength   Overall Strength Deficits   Overall Strength Comments UEs & bil. hips tested in sitting grossly 5/5,   Strength Assessment Site Knee;Ankle   Right/Left Knee Right;Left   Right Knee Flexion 4/5   Right Knee Extension 4/5   Left Knee Flexion 3+/5   Left Knee Extension 3+/5   Right/Left Ankle Left   Left Ankle Dorsiflexion 3-/5   Left Ankle Plantar Flexion 2/5   Transfers   Transfers Sit to Stand;Stand to Sit   Sit to Stand 5: Supervision;With upper extremity assist;With armrests;From chair/3-in-1  to RW for stabilization   Stand to Sit 5: Supervision;With upper extremity assist;With armrests;To chair/3-in-1  from RW for balance   Ambulation/Gait   Ambulation/Gait Yes   Ambulation/Gait Assistance 5: Supervision   Ambulation Distance (Feet) 100 Feet   Assistive device Prosthesis;Rolling walker   Gait Pattern Step-through pattern;Decreased step length - left;Decreased stance time - right;Decreased hip/knee flexion - right;Decreased weight shift to right;Right hip hike;Right foot flat;Decreased dorsiflexion - left;Trunk flexed;Abducted- right;Poor foot clearance - right   Ambulation Surface Indoor;Level   Gait velocity 0.87 ft/sec   Standardized Balance Assessment   Standardized Balance Assessment Berg Balance Test   Berg Balance Test   Sit to Stand  Needs minimal aid to stand or to stabilize  uses RW to stabalize   Standing Unsupported Able to stand 2 minutes with supervision  safe with RW support   Sitting with Back Unsupported but Feet Supported on Floor or Stool Able to sit safely and securely 2 minutes   Stand to Sit Uses backs of legs against chair to control descent   Transfers Able to transfer safely, definite need of hands   Standing Unsupported with Eyes Closed Needs help to keep from falling  With RW safe 10 sec   Standing Ubsupported with Feet Together Needs help to attain position but able to stand for 30 seconds with feet together  with RW >1 min safely   From Standing, Reach Forward with Outstretched Arm Loses balance while trying/requires external support  with RW support reaches 7"   From  Standing Position, Pick up Object from Floor Unable to try/needs assist to keep balance  with RW needs close supervision / contact   From Standing Position, Turn to Look Behind Over each Shoulder Needs assist to keep from losing balance and falling  with RW turns to sides safely   Turn 360 Degrees Needs assistance while turning  with RW close supervision & cues   Standing Unsupported, Alternately Place Feet on Step/Stool Needs assistance to keep from falling or unable to try  with RW minA   Standing Unsupported, One Foot in [employer] balance while stepping or standing  with RW small step & holds 20 sec   Standing on One Leg Unable to try or needs assist to prevent fall  with RW 10 sec on left LE   Total Score 14         Prosthetics Assessment - 02/15/15 1100    Prosthetics   Prosthetic Care Dependent with Skin check;Residual limb care;Care of non-amputated limb;Prosthetic cleaning;Ply sock cleaning;Correct ply sock adjustment;Proper wear schedule/adjustment;Proper weight-bearing schedule/adjustment   Donning prosthesis  Supervision   Doffing prosthesis  Supervision   Current prosthetic wear tolerance (days/week)  2 of 5 days  since delivery   Current prosthetic wear tolerance (#hours/day)  30 minutes   Current prosthetic weight-bearing tolerance (hours/day)  5 minutes PWB on prosthesis with RW support left knee pain increased 2 increments & back pain decreased 1 increment. No pain /discomfort in residual limb.    Edema non-pitting with good capillary refill   Residual limb condition  no open areas, recently shaved hair (causes folluculitis with prosthesis use), normal color, temperature & moisture levels                  [facility] Adult PT Treatment/Exercise - 02/15/15 1100    Prosthetics   Education Provided Skin check;Residual limb care;Prosthetic cleaning;Correct ply sock adjustment;Proper Donning;Proper wear schedule/adjustment;Proper weight-bearing schedule/adjustment  initiate wear 2 hrs 2x/day with increase q5 days, stand 5'    Person(s) Educated Patient   Education Method Explanation;Demonstration;Tactile cues;Verbal cues   Education Method Verbalized understanding;Returned demonstration;Tactile cues required;Verbal cues required;Needs further instruction                  PT Short Term Goals - 02/15/15 1100    PT SHORT TERM GOAL #1   Title Patient tolerates wear of prosthesis >10hrs total /day without skin issues or residual limb discomfort. (Target Date: 03/17/2015)   Time 1   Period Months   Status New   PT SHORT TERM GOAL #2   Title Patient performs standing balance activities with RW & prosthesis reaching 10" all directions, to floor and managing clothes modified independent. (Target Date: 03/17/2015)   Time 1   Period Months   Status New   PT SHORT TERM GOAL #3   Title Patient verbalizes proper cleaning & residual limb checking and demonstrates proper donning of prosthesis modified indepedent. (Target Date: 03/17/2015)   Time 1   Period Months   Status New   PT SHORT TERM GOAL #4   Title Patient ambulates 300' with RW & prosthesis with cues only safely. (Target Date:  03/17/2015)   Time 1   Period Months   Status New   PT SHORT TERM GOAL #5   Title Patient negotiates ramps & curbs with RW & prosthesis with cues only safely. (Target Date: 03/17/2015)   Time 1   Period Months   Status New   Additional Short Term Goals  Additional Short Term Goals Yes   PT SHORT TERM GOAL #6   Title Patient reports chronic pain in back & left knee increase < 2 increments from beginning of that session with above activities. (Target Date: 03/17/2015)   Time 1   Period Months   Status New           PT Long Term Goals - 02/15/15 1100    PT LONG TERM GOAL #1   Title Patient tolerates prosthesis wear >90% of awake hours without skin issues or undue tenderness of limb. (Target Date: 04/16/2015)   Time 2   Period Months   Status New   PT LONG TERM GOAL #2   Title Patient verbalizes proper prosthetic care including managing sweat & adjusting ply socks correctly.  (Target Date: 04/16/2015)   Time 2   Period Months   Status New   PT LONG TERM GOAL #3   Title Berg Balance >30/56  (Target Date: 04/16/2015)   Time 2   Period Months   Status New   PT LONG TERM GOAL #4   Title Patient ambulates at household level with cane or less & prosthesis carrying cup or plate modified independent.  (Target Date: 04/16/2015)   Time 2   Period Months   Status New   PT LONG TERM GOAL #5   Title Patient ambulates 400' with LRAD & prosthesis for community moblity modified independent.  (Target Date: 04/16/2015)   Time 2   Period Months   Status New   Additional Long Term Goals   Additional Long Term Goals Yes   PT LONG TERM GOAL #6   Title Patient negotiates stairs, ramps & curbs with LRAD & prosthesis for community mobility modified independent.  (Target Date: 04/16/2015)   Time 2   Period Months   Status New   PT LONG TERM GOAL #7   Title Patient reports chronic back & left knee pain increase </= 2 increments on 0-10 scale with above standing activiities.  (Target Date:  04/16/2015)   Time 2   Period Months   Status New               Plan - 02/15/15 1100    Clinical Impression Statement This 49yo female was active at community level with single point cane prior to amputations. She was limited by chronic back & knee pain. PT will address activity level effects on chronic pain but not directly treat her pain as scope of referral is not to address pain. Patient is limited in prosthesis wear that limits activity level during her day. She is dependent in safe prosthesis care & use limiting safe use of prosthesis. Berg Balance of 14/56 indicates high fall risk and gait velocity <1.52ft/sec (her GV is 0.85ft/sec) also indicates high fall risk. Her gait with prosthesis is dependent on RW with gait deviations for household level activities only currently with no knowledge for community activities like barriers. Patient would benefit from skilled PT to progress closer to prior level of function with prosthesis.                                 Pt will benefit from skilled therapeutic intervention in order to improve on the following deficits Abnormal gait;Decreased activity tolerance;Decreased balance;Decreased endurance;Decreased knowledge of use of DME;Decreased mobility;Decreased strength;Impaired flexibility;Postural dysfunction;Prosthetic Dependency;Pain   Rehab Potential Good   PT Frequency 2x / week   PT Duration Other (  comment)  9 weeks (60 days)   PT Treatment/Interventions ADLs/Self Care Home Management;DME Instruction;Gait training;Stair training;Functional mobility training;Therapeutic exercise;Therapeutic activities;Balance training;Neuromuscular re-education;Patient/family education;Prosthetic Training   PT Next Visit Plan midline HEP at sink, instruct in stairs with 2 rails & ramps / curbs with RW   Consulted and Agree with Plan of Care Patient          G-Codes - 02-22-15 1100    Functional Assessment Tool Used Tolerates wear 2 of 5 days since  delivery for 30 minutes. Patient is dependent in all aspects of prosthetic care.   Functional Limitation Self care   Self Care Current Status [phone]) At least 80 percent but less than 100 percent impaired, limited or restricted   Self Care Goal Status [id number]) At least 1 percent but less than 20 percent impaired, limited or restricted       Problem List Patient Active Problem List   Diagnosis Date Noted  . Seizure 11/13/2014  . Acute renal failure 10/29/2014  . Cellulitis of foot 10/24/2014  . Hyponatremia 10/24/2014  . Hypokalemia 10/24/2014  . Diabetic infection of right foot 10/24/2014  . DM (diabetes mellitus), type 2, uncontrolled   . Epilepsy   . COPD (chronic obstructive pulmonary disease)   . Bipolar 1 disorder   . OCD (obsessive compulsive disorder)   . Hypertension   . COLD (chronic obstructive lung disease)   . Essential hypertension   . Other epilepsy without status epilepticus, not intractable     Kyrene Longan PT, DPT 02/22/2015, 1:58 PM  [facility] [address] [place], [place], [address] Phone: [phone]   Fax:  [phone]

## 2015-02-22 ENCOUNTER — Encounter: Payer: Self-pay | Admitting: Physical Therapy

## 2015-02-22 ENCOUNTER — Ambulatory Visit: Payer: Medicare Other | Attending: Orthopedic Surgery | Admitting: Physical Therapy

## 2015-02-22 DIAGNOSIS — R6889 Other general symptoms and signs: Secondary | ICD-10-CM

## 2015-02-22 DIAGNOSIS — R2681 Unsteadiness on feet: Secondary | ICD-10-CM | POA: Diagnosis present

## 2015-02-22 DIAGNOSIS — R29818 Other symptoms and signs involving the nervous system: Secondary | ICD-10-CM | POA: Insufficient documentation

## 2015-02-22 DIAGNOSIS — R269 Unspecified abnormalities of gait and mobility: Secondary | ICD-10-CM | POA: Diagnosis present

## 2015-02-22 DIAGNOSIS — R2689 Other abnormalities of gait and mobility: Secondary | ICD-10-CM

## 2015-02-22 DIAGNOSIS — Z89511 Acquired absence of right leg below knee: Secondary | ICD-10-CM | POA: Diagnosis present

## 2015-02-22 DIAGNOSIS — M623 Immobility syndrome (paraplegic): Secondary | ICD-10-CM | POA: Diagnosis present

## 2015-02-22 DIAGNOSIS — Z5189 Encounter for other specified aftercare: Secondary | ICD-10-CM | POA: Diagnosis present

## 2015-02-22 DIAGNOSIS — Z4789 Encounter for other orthopedic aftercare: Secondary | ICD-10-CM

## 2015-02-22 DIAGNOSIS — M2560 Stiffness of unspecified joint, not elsewhere classified: Secondary | ICD-10-CM

## 2015-02-22 DIAGNOSIS — Z7409 Other reduced mobility: Secondary | ICD-10-CM

## 2015-02-22 DIAGNOSIS — R531 Weakness: Secondary | ICD-10-CM

## 2015-02-22 NOTE — Therapy (Signed)
[facility] [address] [place], [place], [address] Phone: [phone]   Fax:  [phone]  Physical Therapy Treatment  Patient Details  Name: Mallory Potts MRN: [id number] Date of Birth: 04-03-1966 Referring Provider:  Arnoldo Morale, MD  Encounter Date: 02/22/2015      PT End of Session - 02/22/15 1015    Visit Number 2   Number of Visits 18   Date for PT Re-Evaluation 04/16/15   Authorization Type G-Code & progress report every 10th visit   PT Start Time 1015   PT Stop Time 1100   PT Time Calculation (min) 45 min   Equipment Utilized During Treatment Gait belt   Activity Tolerance Patient tolerated treatment well   Behavior During Therapy [facility] for tasks assessed/performed      Past Medical History  Diagnosis Date  . Diabetes mellitus   . Asthma   . Arthritis   . Hypertension   . Epilepsy ([place])   . Seizures ([place])   . Thyroid disease   . COPD (chronic obstructive pulmonary disease) ([place])   . Bipolar 1 disorder ([place])   . Peripheral edema   . OCD (obsessive compulsive disorder)   . COPD (chronic obstructive pulmonary disease) ([place])   . Depression   . Anxiety     Past Surgical History  Procedure Laterality Date  . Tonsillectomy    . Knee arthroscopy    . Amputation Right 10/24/2014    Procedure: AMPUTATION RAY RIGHT SECOND TOE;  Surgeon: Netta Cedars, MD;  Location: [facility];  Service: Orthopedics;  Laterality: Right;  . Amputation Right 10/27/2014    Procedure: RIGHT BELOW KNEE AMPUTATION;  Surgeon: [name], MD;  Location: [facility];  Service: Orthopedics;  Laterality: Right;    There were no vitals filed for this visit.  Visit Diagnosis:  Abnormality of gait  Unsteadiness  Balance problems  Decreased functional activity tolerance  Weakness generalized  Stiffness due to immobility  Status post below knee amputation of right lower extremity ([place])  Encounter for prosthetic gait training       Subjective Assessment - 02/22/15 1021    Subjective She has been wearing prosthesis 2hrs 2x/day. Mild tenderness on tibia.    Currently in Pain? Yes   Pain Score 5    Pain Location Back   Pain Orientation Lower   Pain Descriptors / Indicators Aching;Burning;Stabbing   Pain Type Chronic pain   Pain Onset More than a month ago   Pain Frequency Constant   Pain Score 4   Pain Location Knee   Pain Orientation Left   Pain Descriptors / Indicators Stabbing;Burning   Pain Onset More than a month ago   Pain Frequency Constant                         [facility] Adult PT Treatment/Exercise - 02/22/15 1015    Transfers   Transfers Sit to Stand;Stand to Sit   Sit to Stand 5: Supervision;With upper extremity assist;With armrests;From chair/3-in-1  to RW for stabilization   Stand to Sit 5: Supervision;With upper extremity assist;With armrests;To chair/3-in-1  from RW for balance   Ambulation/Gait   Ambulation/Gait Yes   Ambulation/Gait Assistance 5: Supervision   Ambulation/Gait Assistance Details verbal & demo cues with proper step width, posture   Ambulation Distance (Feet) 100 Feet  100' x 2, 25' x 3 working on ply socks adjustment   Assistive device Prosthesis;Rolling walker   Gait Pattern Step-through  pattern;Decreased step length - left;Decreased stance time - right;Decreased hip/knee flexion - right;Decreased weight shift to right;Right hip hike;Right foot flat;Decreased dorsiflexion - left;Trunk flexed;Abducted- right;Poor foot clearance - right   Ambulation Surface Indoor;Level   Gait velocity --   Stairs Yes   Stairs Assistance 4: Min assist;5: Supervision   Stairs Assistance Details (indicate cue type and reason) PT demo, instructed in modified technique with prosthesis, PT changed which LE is weaker due to left knee issues > prosthesis since she has BKA   Stair Management Technique Two rails;Step to pattern;Forwards  left LE weaker with knee, right LE stronger with  BKA prosthe   Number of Stairs 4   Ramp 4: Min assist  RW & prosthesis   Ramp Details (indicate cue type and reason) PT demo, instructed technique with prosthesis   Curb 4: Min assist  RW & prosthesis   Curb Details (indicate cue type and reason) PT demo, instructed technique with prosthesis  left LE used as weaker in sequence   Posture/Postural Control   Posture/Postural Control --   Postural Limitations --   Prosthetics   Prosthetic Care Comments  donning liner orienting pin to pull distal tissue anteriorly to pad tibia area; PT instructed in adjusting ply socks with too few, too many & correct number.   Current prosthetic wear tolerance (days/week)  daily since eval   Current prosthetic wear tolerance (#hours/day)  increase to 3 hrs 2x/day   Current prosthetic weight-bearing tolerance (hours/day)  5 minutes PWB on prosthesis with RW support left knee pain increased 2 increments & back pain decreased 1 increment. No pain /discomfort in residual limb.    Edema --   Residual limb condition  no open areas, PT removed suture that was coming out of medial incision   Education Provided Skin check;Residual limb care;Correct ply sock adjustment;Proper Donning;Proper wear schedule/adjustment;Proper weight-bearing schedule/adjustment   Person(s) Educated Patient   Education Method Explanation;Demonstration;Tactile cues;Verbal cues   Education Method Verbalized understanding;Returned demonstration;Tactile cues required;Verbal cues required;Needs further instruction                  PT Short Term Goals - 02/15/15 1100    PT SHORT TERM GOAL #1   Title Patient tolerates wear of prosthesis >10hrs total /day without skin issues or residual limb discomfort. (Target Date: 03/17/2015)   Time 1   Period Months   Status New   PT SHORT TERM GOAL #2   Title Patient performs standing balance activities with RW & prosthesis reaching 10" all directions, to floor and managing clothes modified  independent. (Target Date: 03/17/2015)   Time 1   Period Months   Status New   PT SHORT TERM GOAL #3   Title Patient verbalizes proper cleaning & residual limb checking and demonstrates proper donning of prosthesis modified indepedent. (Target Date: 03/17/2015)   Time 1   Period Months   Status New   PT SHORT TERM GOAL #4   Title Patient ambulates 300' with RW & prosthesis with cues only safely. (Target Date: 03/17/2015)   Time 1   Period Months   Status New   PT SHORT TERM GOAL #5   Title Patient negotiates ramps & curbs with RW & prosthesis with cues only safely. (Target Date: 03/17/2015)   Time 1   Period Months   Status New   Additional Short Term Goals   Additional Short Term Goals Yes   PT SHORT TERM GOAL #6   Title Patient reports chronic pain in  back & left knee increase < 2 increments from beginning of that session with above activities. (Target Date: 03/17/2015)   Time 1   Period Months   Status New           PT Long Term Goals - 02/15/15 1100    PT LONG TERM GOAL #1   Title Patient tolerates prosthesis wear >90% of awake hours without skin issues or undue tenderness of limb. (Target Date: 04/16/2015)   Time 2   Period Months   Status New   PT LONG TERM GOAL #2   Title Patient verbalizes proper prosthetic care including managing sweat & adjusting ply socks correctly.  (Target Date: 04/16/2015)   Time 2   Period Months   Status New   PT LONG TERM GOAL #3   Title Berg Balance >30/56  (Target Date: 04/16/2015)   Time 2   Period Months   Status New   PT LONG TERM GOAL #4   Title Patient ambulates at household level with cane or less & prosthesis carrying cup or plate modified independent.  (Target Date: 04/16/2015)   Time 2   Period Months   Status New   PT LONG TERM GOAL #5   Title Patient ambulates 400' with LRAD & prosthesis for community moblity modified independent.  (Target Date: 04/16/2015)   Time 2   Period Months   Status New   Additional Long  Term Goals   Additional Long Term Goals Yes   PT LONG TERM GOAL #6   Title Patient negotiates stairs, ramps & curbs with LRAD & prosthesis for community mobility modified independent.  (Target Date: 04/16/2015)   Time 2   Period Months   Status New   PT LONG TERM GOAL #7   Title Patient reports chronic back & left knee pain increase </= 2 increments on 0-10 scale with above standing activiities.  (Target Date: 04/16/2015)   Time 2   Period Months   Status New               Plan - 02/22/15 1015    Clinical Impression Statement Patient reports and appears to understand better how to adjust ply socks. Patient was able to negotiate barriers with prosthesis with minimal assist and began to cry due to "happy" she could accomplish climbing stairs.   Pt will benefit from skilled therapeutic intervention in order to improve on the following deficits Abnormal gait;Decreased activity tolerance;Decreased balance;Decreased endurance;Decreased knowledge of use of DME;Decreased mobility;Decreased strength;Impaired flexibility;Postural dysfunction;Prosthetic Dependency;Pain   Rehab Potential Good   PT Frequency 2x / week   PT Duration Other (comment)  9 weeks (60 days)   PT Treatment/Interventions ADLs/Self Care Home Management;DME Instruction;Gait training;Stair training;Functional mobility training;Therapeutic exercise;Therapeutic activities;Balance training;Neuromuscular re-education;Patient/family education;Prosthetic Training   PT Next Visit Plan midline HEP at sink, review prosthetic care, gait with RW including barriers   Consulted and Agree with Plan of Care Patient        Problem List Patient Active Problem List   Diagnosis Date Noted  . Seizure ([place]) 11/13/2014  . Acute renal failure ([place]) 10/29/2014  . Cellulitis of foot 10/24/2014  . Hyponatremia 10/24/2014  . Hypokalemia 10/24/2014  . Diabetic infection of right foot ([place]) 10/24/2014  . DM (diabetes mellitus), type 2,  uncontrolled ([place])   . Epilepsy ([place])   . COPD (chronic obstructive pulmonary disease) ([place])   . Bipolar 1 disorder ([place])   . OCD (obsessive compulsive disorder)   . Hypertension   . COLD (  chronic obstructive lung disease) ([place])   . Essential hypertension   . Other epilepsy without status epilepticus, not intractable ([place])     Omolola Mittman PT, DPT 02/22/2015, 12:12 PM  [facility] [address] [place], [place], [address] Phone: [phone]   Fax:  [phone]

## 2015-02-24 ENCOUNTER — Ambulatory Visit: Payer: Medicare Other | Admitting: Physical Therapy

## 2015-02-24 ENCOUNTER — Encounter: Payer: Self-pay | Admitting: Physical Therapy

## 2015-02-24 DIAGNOSIS — R531 Weakness: Secondary | ICD-10-CM

## 2015-02-24 DIAGNOSIS — R6889 Other general symptoms and signs: Secondary | ICD-10-CM

## 2015-02-24 DIAGNOSIS — R269 Unspecified abnormalities of gait and mobility: Secondary | ICD-10-CM | POA: Diagnosis not present

## 2015-02-24 DIAGNOSIS — R2681 Unsteadiness on feet: Secondary | ICD-10-CM

## 2015-02-24 DIAGNOSIS — R2689 Other abnormalities of gait and mobility: Secondary | ICD-10-CM

## 2015-02-24 DIAGNOSIS — Z4789 Encounter for other orthopedic aftercare: Secondary | ICD-10-CM

## 2015-02-24 DIAGNOSIS — Z89511 Acquired absence of right leg below knee: Secondary | ICD-10-CM

## 2015-02-24 NOTE — Therapy (Signed)
[facility] [address] [place], [place], [address] Phone: [phone]   Fax:  [phone]  Physical Therapy Treatment  Patient Details  Name: Mallory Potts MRN: [id number] Date of Birth: Feb 16, 1966 Referring Provider:  Arnoldo Morale, MD  Encounter Date: 02/24/2015      PT End of Session - 02/24/15 1110    Visit Number 3   Number of Visits 18   Date for PT Re-Evaluation 04/16/15   Authorization Type G-Code & progress report every 10th visit   PT Start Time 1110   PT Stop Time 1148   PT Time Calculation (min) 38 min   Equipment Utilized During Treatment Gait belt   Activity Tolerance Patient tolerated treatment well   Behavior During Therapy [facility] for tasks assessed/performed      Past Medical History  Diagnosis Date  . Diabetes mellitus   . Asthma   . Arthritis   . Hypertension   . Epilepsy ([place])   . Seizures ([place])   . Thyroid disease   . COPD (chronic obstructive pulmonary disease) ([place])   . Bipolar 1 disorder ([place])   . Peripheral edema   . OCD (obsessive compulsive disorder)   . COPD (chronic obstructive pulmonary disease) ([place])   . Depression   . Anxiety     Past Surgical History  Procedure Laterality Date  . Tonsillectomy    . Knee arthroscopy    . Amputation Right 10/24/2014    Procedure: AMPUTATION RAY RIGHT SECOND TOE;  Surgeon: Netta Cedars, MD;  [facility];  Service: Orthopedics;  Laterality: Right;  . Amputation Right 10/27/2014    Procedure: RIGHT BELOW KNEE AMPUTATION;  Surgeon: [name], MD;  Location: [facility];  Service: Orthopedics;  Laterality: Right;    There were no vitals filed for this visit.  Visit Diagnosis:  Abnormality of gait  Unsteadiness  Balance problems  Decreased functional activity tolerance  Weakness generalized  Status post below knee amputation of right lower extremity ([place])  Encounter for prosthetic gait training      Subjective Assessment - 02/24/15  1110    Subjective She is wearing prosthesis 3hrs 2x/day since last PT appt 2 days ago with mild distal tibia tenderness.   Currently in Pain? Yes   Pain Score 4    Pain Location Back   Pain Orientation Lower   Pain Descriptors / Indicators Aching;Burning;Stabbing   Pain Type Chronic pain                         [facility] Adult PT Treatment/Exercise - 02/24/15 1110    Transfers   Transfers Sit to Stand;Stand to Sit   Sit to Stand 5: Supervision;With upper extremity assist;With armrests;From chair/3-in-1;4: Min assist  to RW for stabilization, minA chairs without armrests   Sit to Stand Details Visual cues/gestures for sequencing;Verbal cues for sequencing;Verbal cues for technique   Sit to Stand Details (indicate cue type and reason) PT demo, instructed technique chairs without armrests   Stand to Sit 5: Supervision;With upper extremity assist;With armrests;To chair/3-in-1  from RW for balance   Stand to Sit Details (indicate cue type and reason) Visual cues/gestures for sequencing;Verbal cues for sequencing;Verbal cues for technique   Stand to Sit Details PT demo, instructed technique chairs without armrests   Ambulation/Gait   Ambulation/Gait Yes   Ambulation/Gait Assistance 5: Supervision   Ambulation/Gait Assistance Details verbal cues on step width & posture   Ambulation Distance (Feet) 130 Feet  Assistive device Prosthesis;Rolling walker   Gait Pattern Step-through pattern;Decreased step length - left;Decreased stance time - right;Decreased hip/knee flexion - right;Decreased weight shift to right;Right hip hike;Right foot flat;Decreased dorsiflexion - left;Trunk flexed;Abducted- right;Poor foot clearance - right   Ambulation Surface Indoor;Level   Stairs Yes   Stairs Assistance 5: Supervision   Stairs Assistance Details (indicate cue type and reason) verbal cues on technique   Stair Management Technique Two rails;Step to pattern;Forwards  left LE weaker with knee,  right LE stronger with BKA prosthe   Number of Stairs 4   Ramp 5: Supervision  RW & prosthesis   Ramp Details (indicate cue type and reason) verbal cues on technique   Curb 5: Supervision  RW & prosthesis   Curb Details (indicate cue type and reason) verbal cues on technique   High Level Balance   High Level Balance Activities Side stepping;Backward walking  walking with eyes closed to simulate walking in dark   High Level Balance Comments verbal & tactile cues on wt shift & technique; BUE support at counter or RW   Prosthetics   Prosthetic Care Comments  donning liner orienting pin to pull distal tissue anteriorly to pad tibia area; PT instructed in adjusting ply socks with too few, too many & correct number.   Current prosthetic wear tolerance (days/week)  daily   Current prosthetic wear tolerance (#hours/day)  3 hrs 2x/day   Current prosthetic weight-bearing tolerance (hours/day)  --   Residual limb condition  normal   Education Provided Skin check;Residual limb care;Correct ply sock adjustment;Proper Donning;Proper wear schedule/adjustment;Proper weight-bearing schedule/adjustment   Person(s) Educated Patient   Education Method Explanation;Demonstration;Verbal cues   Education Method Verbalized understanding;Returned demonstration;Verbal cues required   Donning Prosthesis Supervision                PT Education - 02/24/15 1110    Education provided Yes   Education Details increasing activity level with higher frequency of short distance gait in home, initiate enter/exit home with RW family following with w/c, then initiate enter/exit community with family following with w/c   Person(s) Educated Patient   Methods Explanation   Comprehension Verbalized understanding;Need further instruction          PT Short Term Goals - 02/15/15 1100    PT SHORT TERM GOAL #1   Title Patient tolerates wear of prosthesis >10hrs total /day without skin issues or residual limb  discomfort. (Target Date: 03/17/2015)   Time 1   Period Months   Status New   PT SHORT TERM GOAL #2   Title Patient performs standing balance activities with RW & prosthesis reaching 10" all directions, to floor and managing clothes modified independent. (Target Date: 03/17/2015)   Time 1   Period Months   Status New   PT SHORT TERM GOAL #3   Title Patient verbalizes proper cleaning & residual limb checking and demonstrates proper donning of prosthesis modified indepedent. (Target Date: 03/17/2015)   Time 1   Period Months   Status New   PT SHORT TERM GOAL #4   Title Patient ambulates 300' with RW & prosthesis with cues only safely. (Target Date: 03/17/2015)   Time 1   Period Months   Status New   PT SHORT TERM GOAL #5   Title Patient negotiates ramps & curbs with RW & prosthesis with cues only safely. (Target Date: 03/17/2015)   Time 1   Period Months   Status New   Additional Short Term Goals  Additional Short Term Goals Yes   PT SHORT TERM GOAL #6   Title Patient reports chronic pain in back & left knee increase < 2 increments from beginning of that session with above activities. (Target Date: 03/17/2015)   Time 1   Period Months   Status New           PT Long Term Goals - 02/15/15 1100    PT LONG TERM GOAL #1   Title Patient tolerates prosthesis wear >90% of awake hours without skin issues or undue tenderness of limb. (Target Date: 04/16/2015)   Time 2   Period Months   Status New   PT LONG TERM GOAL #2   Title Patient verbalizes proper prosthetic care including managing sweat & adjusting ply socks correctly.  (Target Date: 04/16/2015)   Time 2   Period Months   Status New   PT LONG TERM GOAL #3   Title Berg Balance >30/56  (Target Date: 04/16/2015)   Time 2   Period Months   Status New   PT LONG TERM GOAL #4   Title Patient ambulates at household level with cane or less & prosthesis carrying cup or plate modified independent.  (Target Date: 04/16/2015)    Time 2   Period Months   Status New   PT LONG TERM GOAL #5   Title Patient ambulates 400' with LRAD & prosthesis for community moblity modified independent.  (Target Date: 04/16/2015)   Time 2   Period Months   Status New   Additional Long Term Goals   Additional Long Term Goals Yes   PT LONG TERM GOAL #6   Title Patient negotiates stairs, ramps & curbs with LRAD & prosthesis for community mobility modified independent.  (Target Date: 04/16/2015)   Time 2   Period Months   Status New   PT LONG TERM GOAL #7   Title Patient reports chronic back & left knee pain increase </= 2 increments on 0-10 scale with above standing activiities.  (Target Date: 04/16/2015)   Time 2   Period Months   Status New               Plan - 02/24/15 1110    Clinical Impression Statement Patient improved ability to negotiate barriers with only verbal cues on technique. Patient seems to understand increasing activity level slowly to limit pain on residual limb.   Pt will benefit from skilled therapeutic intervention in order to improve on the following deficits Abnormal gait;Decreased activity tolerance;Decreased balance;Decreased endurance;Decreased knowledge of use of DME;Decreased mobility;Decreased strength;Impaired flexibility;Postural dysfunction;Prosthetic Dependency;Pain   Rehab Potential Good   PT Frequency 2x / week   PT Duration Other (comment)  9 weeks (60 days)   PT Treatment/Interventions ADLs/Self Care Home Management;DME Instruction;Gait training;Stair training;Functional mobility training;Therapeutic exercise;Therapeutic activities;Balance training;Neuromuscular re-education;Patient/family education;Prosthetic Training   PT Next Visit Plan midline HEP at sink, review prosthetic care, gait with RW including barriers   Consulted and Agree with Plan of Care Patient        Problem List Patient Active Problem List   Diagnosis Date Noted  . Seizure ([place]) 11/13/2014  . Acute renal  failure ([place]) 10/29/2014  . Cellulitis of foot 10/24/2014  . Hyponatremia 10/24/2014  . Hypokalemia 10/24/2014  . Diabetic infection of right foot ([place]) 10/24/2014  . DM (diabetes mellitus), type 2, uncontrolled ([place])   . Epilepsy ([place])   . COPD (chronic obstructive pulmonary disease) ([place])   . Bipolar 1 disorder ([place])   . OCD (  obsessive compulsive disorder)   . Hypertension   . COLD (chronic obstructive lung disease) ([place])   . Essential hypertension   . Other epilepsy without status epilepticus, not intractable ([place])     Ashford Clouse PT, DPT 02/24/2015, 7:35 PM  [facility] [address] [employer] [place], [place], [address] Phone: [phone]   Fax:  [phone]

## 2015-03-01 ENCOUNTER — Ambulatory Visit: Payer: Medicare Other | Admitting: Physical Therapy

## 2015-03-01 ENCOUNTER — Encounter: Payer: Self-pay | Admitting: Physical Therapy

## 2015-03-01 DIAGNOSIS — R2681 Unsteadiness on feet: Secondary | ICD-10-CM

## 2015-03-01 DIAGNOSIS — R269 Unspecified abnormalities of gait and mobility: Secondary | ICD-10-CM | POA: Diagnosis not present

## 2015-03-01 DIAGNOSIS — R531 Weakness: Secondary | ICD-10-CM

## 2015-03-01 DIAGNOSIS — Z4789 Encounter for other orthopedic aftercare: Secondary | ICD-10-CM

## 2015-03-01 DIAGNOSIS — M2560 Stiffness of unspecified joint, not elsewhere classified: Secondary | ICD-10-CM

## 2015-03-01 DIAGNOSIS — R6889 Other general symptoms and signs: Secondary | ICD-10-CM

## 2015-03-01 DIAGNOSIS — Z7409 Other reduced mobility: Secondary | ICD-10-CM

## 2015-03-01 DIAGNOSIS — R2689 Other abnormalities of gait and mobility: Secondary | ICD-10-CM

## 2015-03-01 DIAGNOSIS — Z89511 Acquired absence of right leg below knee: Secondary | ICD-10-CM

## 2015-03-01 NOTE — Therapy (Signed)
[facility] [address] [place], [place], [address] Phone: [phone]   Fax:  [phone]  Physical Therapy Treatment  Patient Details  Name: Mallory Potts MRN: [id number] Date of Birth: 05-09-1966 Referring Provider:  Arnoldo Morale, MD  Encounter Date: 03/01/2015      PT End of Session - 03/01/15 1100    Visit Number 4   Number of Visits 18   Date for PT Re-Evaluation 04/16/15   Authorization Type G-Code & progress report every 10th visit   PT Start Time 1100   PT Stop Time 1146   PT Time Calculation (min) 46 min   Equipment Utilized During Treatment Gait belt   Activity Tolerance Patient tolerated treatment well   Behavior During Therapy [facility] for tasks assessed/performed      Past Medical History  Diagnosis Date  . Diabetes mellitus   . Asthma   . Arthritis   . Hypertension   . Epilepsy ([place])   . Seizures ([place])   . Thyroid disease   . COPD (chronic obstructive pulmonary disease) ([place])   . Bipolar 1 disorder ([place])   . Peripheral edema   . OCD (obsessive compulsive disorder)   . COPD (chronic obstructive pulmonary disease) ([place])   . Depression   . Anxiety     Past Surgical History  Procedure Laterality Date  . Tonsillectomy    . Knee arthroscopy    . Amputation Right 10/24/2014    Procedure: AMPUTATION RAY RIGHT SECOND TOE;  Surgeon: Netta Cedars, MD;  Location: [facility];  Service: Orthopedics;  Laterality: Right;  . Amputation Right 10/27/2014    Procedure: RIGHT BELOW KNEE AMPUTATION;  Surgeon: [name], MD;  Location: [facility];  Service: Orthopedics;  Laterality: Right;    There were no vitals filed for this visit.  Visit Diagnosis:  Abnormality of gait  Unsteadiness  Balance problems  Decreased functional activity tolerance  Weakness generalized  Status post below knee amputation of right lower extremity ([place])  Encounter for prosthetic gait training  Stiffness due to immobility       Subjective Assessment - 03/01/15 1100    Subjective She is wearing prosthesis 3hrs 2x/day most days but one day 6 hrs straight when she went out with friemds.    Currently in Pain? Yes   Pain Score 2    Pain Location Back   Pain Orientation Lower   Pain Descriptors / Indicators Aching   Pain Type Chronic pain   Pain Onset More than a month ago   Pain Frequency Constant   Aggravating Factors  sitting too long, increasing walking too quickly   Pain Relieving Factors laying on left side   Multiple Pain Sites Yes   Pain Score 1   Pain Location Knee   Pain Orientation Left   Pain Descriptors / Indicators Stabbing;Burning   Pain Onset More than a month ago   Pain Frequency Constant   Aggravating Factors  standing too much   Pain Relieving Factors sitting down with support                         [facility] Adult PT Treatment/Exercise - 03/01/15 1100    Transfers   Transfers Sit to Stand;Stand to Sit   Sit to Stand 5: Supervision;With upper extremity assist;With armrests;From chair/3-in-1  from rollator walker seat   Sit to Stand Details Visual cues/gestures for sequencing;Verbal cues for sequencing;Verbal cues for technique   Sit to Stand  Details (indicate cue type and reason) PT demo, instructed in technique with rollator walker   Stand to Sit 5: Supervision;With upper extremity assist;With armrests;To chair/3-in-1  to rollator walker seat   Stand to Sit Details (indicate cue type and reason) Visual cues/gestures for sequencing;Verbal cues for sequencing;Verbal cues for technique   Stand to Sit Details PT demo, instructed in rollator walker   Ambulation/Gait   Ambulation/Gait Yes   Ambulation/Gait Assistance 5: Supervision   Ambulation/Gait Assistance Details PT demo, instructed in rollator walker safety, use. Verbal cues on posture.   Ambulation Distance (Feet) 250 Feet  250' with std RW, 200' X 2 with rollator   Assistive device Prosthesis;Rolling walker;Rollator    Gait Pattern Step-through pattern;Decreased step length - left;Decreased stance time - right;Decreased hip/knee flexion - right;Decreased weight shift to right;Right hip hike;Right foot flat;Decreased dorsiflexion - left;Trunk flexed;Abducted- right;Poor foot clearance - right   Ambulation Surface Indoor;Level   Stairs --   Stairs Assistance --   Stair Management Technique --   Number of Stairs --   Ramp 5: Supervision  Rollator & prosthesis   Ramp Details (indicate cue type and reason) PT demo, instructed in rollator technique   Curb 5: Supervision  Rollator walker & prosthesis   Curb Details (indicate cue type and reason) PT demo, instructed in rollator technique   High Level Balance   High Level Balance Activities --   High Level Balance Comments --   Prosthetics   Prosthetic Care Comments  increasing activity level   Current prosthetic wear tolerance (days/week)  daily   Current prosthetic wear tolerance (#hours/day)  increase to 4 hrs 2x/day drying half way   Residual limb condition  normal   Education Provided Correct ply sock adjustment;Proper wear schedule/adjustment;Proper weight-bearing schedule/adjustment   Person(s) Educated Patient   Education Method Explanation;Verbal cues   Education Method Verbalized understanding;Needs further instruction;Verbal cues required   Donning Prosthesis Supervision                  PT Short Term Goals - 02/15/15 1100    PT SHORT TERM GOAL #1   Title Patient tolerates wear of prosthesis >10hrs total /day without skin issues or residual limb discomfort. (Target Date: 03/17/2015)   Time 1   Period Months   Status New   PT SHORT TERM GOAL #2   Title Patient performs standing balance activities with RW & prosthesis reaching 10" all directions, to floor and managing clothes modified independent. (Target Date: 03/17/2015)   Time 1   Period Months   Status New   PT SHORT TERM GOAL #3   Title Patient verbalizes proper cleaning &  residual limb checking and demonstrates proper donning of prosthesis modified indepedent. (Target Date: 03/17/2015)   Time 1   Period Months   Status New   PT SHORT TERM GOAL #4   Title Patient ambulates 300' with RW & prosthesis with cues only safely. (Target Date: 03/17/2015)   Time 1   Period Months   Status New   PT SHORT TERM GOAL #5   Title Patient negotiates ramps & curbs with RW & prosthesis with cues only safely. (Target Date: 03/17/2015)   Time 1   Period Months   Status New   Additional Short Term Goals   Additional Short Term Goals Yes   PT SHORT TERM GOAL #6   Title Patient reports chronic pain in back & left knee increase < 2 increments from beginning of that session with above activities. (Target  Date: 03/17/2015)   Time 1   Period Months   Status New           PT Long Term Goals - 02/15/15 1100    PT LONG TERM GOAL #1   Title Patient tolerates prosthesis wear >90% of awake hours without skin issues or undue tenderness of limb. (Target Date: 04/16/2015)   Time 2   Period Months   Status New   PT LONG TERM GOAL #2   Title Patient verbalizes proper prosthetic care including managing sweat & adjusting ply socks correctly.  (Target Date: 04/16/2015)   Time 2   Period Months   Status New   PT LONG TERM GOAL #3   Title Berg Balance >30/56  (Target Date: 04/16/2015)   Time 2   Period Months   Status New   PT LONG TERM GOAL #4   Title Patient ambulates at household level with cane or less & prosthesis carrying cup or plate modified independent.  (Target Date: 04/16/2015)   Time 2   Period Months   Status New   PT LONG TERM GOAL #5   Title Patient ambulates 400' with LRAD & prosthesis for community moblity modified independent.  (Target Date: 04/16/2015)   Time 2   Period Months   Status New   Additional Long Term Goals   Additional Long Term Goals Yes   PT LONG TERM GOAL #6   Title Patient negotiates stairs, ramps & curbs with LRAD & prosthesis for  community mobility modified independent.  (Target Date: 04/16/2015)   Time 2   Period Months   Status New   PT LONG TERM GOAL #7   Title Patient reports chronic back & left knee pain increase </= 2 increments on 0-10 scale with above standing activiities.  (Target Date: 04/16/2015)   Time 2   Period Months   Status New               Plan - 03/01/15 1100    Clinical Impression Statement Patient seems safe with rollator walker with prosthesis with supervision which will permit a seat for resting for increasing community activities without pain.   Pt will benefit from skilled therapeutic intervention in order to improve on the following deficits Abnormal gait;Decreased activity tolerance;Decreased balance;Decreased endurance;Decreased knowledge of use of DME;Decreased mobility;Decreased strength;Impaired flexibility;Postural dysfunction;Prosthetic Dependency;Pain   Rehab Potential Good   PT Frequency 2x / week   PT Duration Other (comment)  9 weeks (60 days)   PT Treatment/Interventions ADLs/Self Care Home Management;DME Instruction;Gait training;Stair training;Functional mobility training;Therapeutic exercise;Therapeutic activities;Balance training;Neuromuscular re-education;Patient/family education;Prosthetic Training   PT Next Visit Plan midline HEP at sink, review prosthetic care, gait with rollator walker including barriers   Consulted and Agree with Plan of Care Patient        Problem List Patient Active Problem List   Diagnosis Date Noted  . Seizure ([place]) 11/13/2014  . Acute renal failure ([place]) 10/29/2014  . Cellulitis of foot 10/24/2014  . Hyponatremia 10/24/2014  . Hypokalemia 10/24/2014  . Diabetic infection of right foot ([place]) 10/24/2014  . DM (diabetes mellitus), type 2, uncontrolled ([place])   . Epilepsy ([place])   . COPD (chronic obstructive pulmonary disease) ([place])   . Bipolar 1 disorder ([place])   . OCD (obsessive compulsive disorder)   . Hypertension   . COLD  (chronic obstructive lung disease) ([place])   . Essential hypertension   . Other epilepsy without status epilepticus, not intractable ([place])     Shamicka Inga PT, DPT  03/01/2015, 12:06 PM  [facility] [address] [place], [place], [address] Phone: [phone]   Fax:  [phone]

## 2015-03-03 ENCOUNTER — Ambulatory Visit: Payer: Medicare Other | Admitting: Physical Therapy

## 2015-03-03 ENCOUNTER — Encounter: Payer: Self-pay | Admitting: Physical Therapy

## 2015-03-03 DIAGNOSIS — R2681 Unsteadiness on feet: Secondary | ICD-10-CM

## 2015-03-03 DIAGNOSIS — R6889 Other general symptoms and signs: Secondary | ICD-10-CM

## 2015-03-03 DIAGNOSIS — R531 Weakness: Secondary | ICD-10-CM

## 2015-03-03 DIAGNOSIS — R269 Unspecified abnormalities of gait and mobility: Secondary | ICD-10-CM | POA: Diagnosis not present

## 2015-03-03 DIAGNOSIS — Z4789 Encounter for other orthopedic aftercare: Secondary | ICD-10-CM

## 2015-03-03 DIAGNOSIS — R2689 Other abnormalities of gait and mobility: Secondary | ICD-10-CM

## 2015-03-03 NOTE — Therapy (Signed)
[facility] [address] [place], [place], [address] Phone: [phone]   Fax:  [phone]  Physical Therapy Treatment  Patient Details  Name: Kitra Arzt MRN: [id number] Date of Birth: 1965-12-16 Referring Provider:  Arnoldo Morale, MD  Encounter Date: 03/03/2015      PT End of Session - 03/03/15 1029    Visit Number 5   Number of Visits 18   Date for PT Re-Evaluation 04/16/15   Authorization Type G-Code & progress report every 10th visit   PT Start Time 1018   PT Stop Time 1100   PT Time Calculation (min) 42 min   Equipment Utilized During Treatment Gait belt   Activity Tolerance Patient tolerated treatment well   Behavior During Therapy [facility] for tasks assessed/performed      Past Medical History  Diagnosis Date  . Diabetes mellitus   . Asthma   . Arthritis   . Hypertension   . Epilepsy ([place])   . Seizures ([place])   . Thyroid disease   . COPD (chronic obstructive pulmonary disease) ([place])   . Bipolar 1 disorder ([place])   . Peripheral edema   . OCD (obsessive compulsive disorder)   . COPD (chronic obstructive pulmonary disease) ([place])   . Depression   . Anxiety     Past Surgical History  Procedure Laterality Date  . Tonsillectomy    . Knee arthroscopy    . Amputation Right 10/24/2014    Procedure: AMPUTATION RAY RIGHT SECOND TOE;  Surgeon: Netta Cedars, MD;  [facility];  Service: Orthopedics;  Laterality: Right;  . Amputation Right 10/27/2014    Procedure: RIGHT BELOW KNEE AMPUTATION;  Surgeon: [name], MD;  Location: [facility];  Service: Orthopedics;  Laterality: Right;    There were no vitals filed for this visit.  Visit Diagnosis:  Abnormality of gait  Unsteadiness  Balance problems  Decreased functional activity tolerance  Weakness generalized  Encounter for prosthetic gait training      Subjective Assessment - 03/03/15 1023    Subjective No new complaints.  Had a fall last Thursday  (did not tell therapist about last visit). She was up trying to fix her clothing, without her prosthesis on. She had her right knee propped in the chair and lost balance. Twisted herself around so she landed on her back, not the limb. Was able to get herself up. She reports no injuries.                                   Currently in Pain? Yes   Pain Score 5    Pain Location Knee   Pain Orientation Right   Pain Descriptors / Indicators Pressure   Pain Type Chronic pain   Pain Onset Yesterday   Pain Frequency Constant   Aggravating Factors  weight bearing throught prosthesis   Pain Relieving Factors removing prosthesis, sitting down            [facility] Adult PT Treatment/Exercise - 03/03/15 1029    Transfers   Sit to Stand 5: Supervision;With upper extremity assist;With armrests;From chair/3-in-1   Sit to Stand Details Verbal cues for technique;Verbal cues for safe use of DME/AE   Stand to Sit 5: Supervision;With upper extremity assist;With armrests;To chair/3-in-1   Stand to Sit Details (indicate cue type and reason) Verbal cues for safe use of DME/AE;Verbal cues for technique   Ambulation/Gait   Ambulation/Gait Yes  Ambulation/Gait Assistance 5: Supervision   Ambulation/Gait Assistance Details cues on posture, decreased UE reliance on rollator, and to correct gait deviations.   Ambulation Distance (Feet) 220 Feet  x1, 250 x2; 100 x1   Assistive device Prosthesis;Rollator   Gait Pattern Step-through pattern;Decreased step length - left;Decreased stance time - right;Decreased hip/knee flexion - right;Decreased weight shift to right;Right hip hike;Right foot flat;Decreased dorsiflexion - left;Trunk flexed;Abducted- right;Poor foot clearance - right   Ambulation Surface Level;Indoor   Ramp 5: Supervision  with prosthesis and rollator x 2 reps   Ramp Details (indicate cue type and reason) cues on rollator use   Curb 5: Supervision  with rollator and prosthesis x 2 reps   Curb Details  (indicate cue type and reason) cues on sequence and rollator use   Prosthetics   Prosthetic Care Comments  worked on adjusting sock ply throughout session.   Current prosthetic wear tolerance (days/week)  daily   Current prosthetic wear tolerance (#hours/day)  4 hours 2 x day   Residual limb condition  intact per pt   Education Provided Ply sock cleaning;Proper wear schedule/adjustment;Proper weight-bearing schedule/adjustment   Person(s) Educated Patient   Education Method Explanation;Demonstration   Education Method Verbalized understanding;Verbal cues required   Donning Prosthesis Supervision   Doffing Prosthesis Supervision           PT Short Term Goals - 02/15/15 1100    PT SHORT TERM GOAL #1   Title Patient tolerates wear of prosthesis >10hrs total /day without skin issues or residual limb discomfort. (Target Date: 03/17/2015)   Time 1   Period Months   Status New   PT SHORT TERM GOAL #2   Title Patient performs standing balance activities with RW & prosthesis reaching 10" all directions, to floor and managing clothes modified independent. (Target Date: 03/17/2015)   Time 1   Period Months   Status New   PT SHORT TERM GOAL #3   Title Patient verbalizes proper cleaning & residual limb checking and demonstrates proper donning of prosthesis modified indepedent. (Target Date: 03/17/2015)   Time 1   Period Months   Status New   PT SHORT TERM GOAL #4   Title Patient ambulates 300' with RW & prosthesis with cues only safely. (Target Date: 03/17/2015)   Time 1   Period Months   Status New   PT SHORT TERM GOAL #5   Title Patient negotiates ramps & curbs with RW & prosthesis with cues only safely. (Target Date: 03/17/2015)   Time 1   Period Months   Status New   Additional Short Term Goals   Additional Short Term Goals Yes   PT SHORT TERM GOAL #6   Title Patient reports chronic pain in back & left knee increase < 2 increments from beginning of that session with above  activities. (Target Date: 03/17/2015)   Time 1   Period Months   Status New           PT Long Term Goals - 02/15/15 1100    PT LONG TERM GOAL #1   Title Patient tolerates prosthesis wear >90% of awake hours without skin issues or undue tenderness of limb. (Target Date: 04/16/2015)   Time 2   Period Months   Status New   PT LONG TERM GOAL #2   Title Patient verbalizes proper prosthetic care including managing sweat & adjusting ply socks correctly.  (Target Date: 04/16/2015)   Time 2   Period Months   Status New   PT  LONG TERM GOAL #3   Title Berg Balance >30/56  (Target Date: 04/16/2015)   Time 2   Period Months   Status New   PT LONG TERM GOAL #4   Title Patient ambulates at household level with cane or less & prosthesis carrying cup or plate modified independent.  (Target Date: 04/16/2015)   Time 2   Period Months   Status New   PT LONG TERM GOAL #5   Title Patient ambulates 400' with LRAD & prosthesis for community moblity modified independent.  (Target Date: 04/16/2015)   Time 2   Period Months   Status New   Additional Long Term Goals   Additional Long Term Goals Yes   PT LONG TERM GOAL #6   Title Patient negotiates stairs, ramps & curbs with LRAD & prosthesis for community mobility modified independent.  (Target Date: 04/16/2015)   Time 2   Period Months   Status New   PT LONG TERM GOAL #7   Title Patient reports chronic back & left knee pain increase </= 2 increments on 0-10 scale with above standing activiities.  (Target Date: 04/16/2015)   Time 2   Period Months   Status New            Plan - 03/03/15 1029    Clinical Impression Statement Pt has better understanding of sock ply management for fit and comfort. Most likely ready for tibial pads to be added to socket. Pt making steady progress toward goals.    Pt will benefit from skilled therapeutic intervention in order to improve on the following deficits Abnormal gait;Decreased activity  tolerance;Decreased balance;Decreased endurance;Decreased knowledge of use of DME;Decreased mobility;Decreased strength;Impaired flexibility;Postural dysfunction;Prosthetic Dependency;Pain   Rehab Potential Good   PT Frequency 2x / week   PT Duration Other (comment)  9 weeks (60 days)   PT Treatment/Interventions ADLs/Self Care Home Management;DME Instruction;Gait training;Stair training;Functional mobility training;Therapeutic exercise;Therapeutic activities;Balance training;Neuromuscular re-education;Patient/family education;Prosthetic Training   PT Next Visit Plan review prosthetic care, gait with rollator walker including barriers and outdoor paved surfaces, balance activities   Consulted and Agree with Plan of Care Patient        Problem List Patient Active Problem List   Diagnosis Date Noted  . Seizure ([place]) 11/13/2014  . Acute renal failure ([place]) 10/29/2014  . Cellulitis of foot 10/24/2014  . Hyponatremia 10/24/2014  . Hypokalemia 10/24/2014  . Diabetic infection of right foot ([place]) 10/24/2014  . DM (diabetes mellitus), type 2, uncontrolled ([place])   . Epilepsy ([place])   . COPD (chronic obstructive pulmonary disease) ([place])   . Bipolar 1 disorder ([place])   . OCD (obsessive compulsive disorder)   . Hypertension   . COLD (chronic obstructive lung disease) ([place])   . Essential hypertension   . Other epilepsy without status epilepticus, not intractable ([place])     Willow Ora 03/03/2015, 12:33 PM  Willow Ora, PTA, [facility] [address] [place], [place] [address] [phone] 03/03/2015, 12:33 PM

## 2015-03-04 NOTE — Therapy (Signed)
[facility] [address] [place], [place], [address] Phone: [phone]   Fax:  [phone]  Physical Therapy Treatment  Patient Details  Name: Mallory Potts MRN: [id number] Date of Birth: 1966/02/08 Referring Provider:  Arnoldo Morale, MD  Encounter Date: 03/03/2015      PT End of Session - 03/03/15 1029    Visit Number 5   Number of Visits 18   Date for PT Re-Evaluation 04/16/15   Authorization Type G-Code & progress report every 10th visit   PT Start Time 1018   PT Stop Time 1100   PT Time Calculation (min) 42 min   Equipment Utilized During Treatment Gait belt   Activity Tolerance Patient tolerated treatment well   Behavior During Therapy [facility] for tasks assessed/performed      Past Medical History  Diagnosis Date  . Diabetes mellitus   . Asthma   . Arthritis   . Hypertension   . Epilepsy ([place])   . Seizures ([place])   . Thyroid disease   . COPD (chronic obstructive pulmonary disease) ([place])   . Bipolar 1 disorder ([place])   . Peripheral edema   . OCD (obsessive compulsive disorder)   . COPD (chronic obstructive pulmonary disease) ([place])   . Depression   . Anxiety     Past Surgical History  Procedure Laterality Date  . Tonsillectomy    . Knee arthroscopy    . Amputation Right 10/24/2014    Procedure: AMPUTATION RAY RIGHT SECOND TOE;  Surgeon: Netta Cedars, MD;  Location: [facility];  Service: Orthopedics;  Laterality: Right;  . Amputation Right 10/27/2014    Procedure: RIGHT BELOW KNEE AMPUTATION;  Surgeon: [name], MD;  Location: [facility];  Service: Orthopedics;  Laterality: Right;    There were no vitals filed for this visit.  Visit Diagnosis:  Abnormality of gait  Unsteadiness  Balance problems  Decreased functional activity tolerance  Weakness generalized  Encounter for prosthetic gait training      Subjective Assessment - 03/03/15 1023    Subjective No new complaints.  Had a fall last Thursday  (did not tell therapist about last visit). She was up trying to fix her clothing, without her prosthesis on. She had her right knee propped in the chair and lost balance. Twisted herself around so she landed on her back, not the limb. Was able to get herself up. She reports no injuries.                                   Currently in Pain? Yes   Pain Score 5    Pain Location Knee   Pain Orientation Right   Pain Descriptors / Indicators Pressure   Pain Type Chronic pain   Pain Onset Yesterday   Pain Frequency Constant   Aggravating Factors  weight bearing throught prosthesis   Pain Relieving Factors removing prosthesis, sitting down            [facility] Adult PT Treatment/Exercise - 03/03/15 1029    Transfers   Sit to Stand 5: Supervision;With upper extremity assist;With armrests;From chair/3-in-1   Sit to Stand Details Verbal cues for technique;Verbal cues for safe use of DME/AE   Stand to Sit 5: Supervision;With upper extremity assist;With armrests;To chair/3-in-1   Stand to Sit Details (indicate cue type and reason) Verbal cues for safe use of DME/AE;Verbal cues for technique   Ambulation/Gait   Ambulation/Gait Yes  Ambulation/Gait Assistance 5: Supervision   Ambulation/Gait Assistance Details cues on posture, decreased UE reliance on rollator, and to correct gait deviations.   Ambulation Distance (Feet) 220 Feet  x1, 250 x2; 100 x1   Assistive device Prosthesis;Rollator   Gait Pattern Step-through pattern;Decreased step length - left;Decreased stance time - right;Decreased hip/knee flexion - right;Decreased weight shift to right;Right hip hike;Right foot flat;Decreased dorsiflexion - left;Trunk flexed;Abducted- right;Poor foot clearance - right   Ambulation Surface Level;Indoor   Ramp 5: Supervision  with prosthesis and rollator x 2 reps   Ramp Details (indicate cue type and reason) cues on rollator use   Curb 5: Supervision  with rollator and prosthesis x 2 reps   Curb Details  (indicate cue type and reason) cues on sequence and rollator use   Prosthetics   Prosthetic Care Comments  worked on adjusting sock ply throughout session.   Current prosthetic wear tolerance (days/week)  daily   Current prosthetic wear tolerance (#hours/day)  4 hours 2 x day   Residual limb condition  intact per pt   Education Provided Ply sock cleaning;Proper wear schedule/adjustment;Proper weight-bearing schedule/adjustment   Person(s) Educated Patient   Education Method Explanation;Demonstration   Education Method Verbalized understanding;Verbal cues required   Donning Prosthesis Supervision   Doffing Prosthesis Supervision           PT Short Term Goals - 02/15/15 1100    PT SHORT TERM GOAL #1   Title Patient tolerates wear of prosthesis >10hrs total /day without skin issues or residual limb discomfort. (Target Date: 03/17/2015)   Time 1   Period Months   Status New   PT SHORT TERM GOAL #2   Title Patient performs standing balance activities with RW & prosthesis reaching 10" all directions, to floor and managing clothes modified independent. (Target Date: 03/17/2015)   Time 1   Period Months   Status New   PT SHORT TERM GOAL #3   Title Patient verbalizes proper cleaning & residual limb checking and demonstrates proper donning of prosthesis modified indepedent. (Target Date: 03/17/2015)   Time 1   Period Months   Status New   PT SHORT TERM GOAL #4   Title Patient ambulates 300' with RW & prosthesis with cues only safely. (Target Date: 03/17/2015)   Time 1   Period Months   Status New   PT SHORT TERM GOAL #5   Title Patient negotiates ramps & curbs with RW & prosthesis with cues only safely. (Target Date: 03/17/2015)   Time 1   Period Months   Status New   Additional Short Term Goals   Additional Short Term Goals Yes   PT SHORT TERM GOAL #6   Title Patient reports chronic pain in back & left knee increase < 2 increments from beginning of that session with above  activities. (Target Date: 03/17/2015)   Time 1   Period Months   Status New           PT Long Term Goals - 02/15/15 1100    PT LONG TERM GOAL #1   Title Patient tolerates prosthesis wear >90% of awake hours without skin issues or undue tenderness of limb. (Target Date: 04/16/2015)   Time 2   Period Months   Status New   PT LONG TERM GOAL #2   Title Patient verbalizes proper prosthetic care including managing sweat & adjusting ply socks correctly.  (Target Date: 04/16/2015)   Time 2   Period Months   Status New   PT  LONG TERM GOAL #3   Title Berg Balance >30/56  (Target Date: 04/16/2015)   Time 2   Period Months   Status New   PT LONG TERM GOAL #4   Title Patient ambulates at household level with cane or less & prosthesis carrying cup or plate modified independent.  (Target Date: 04/16/2015)   Time 2   Period Months   Status New   PT LONG TERM GOAL #5   Title Patient ambulates 400' with LRAD & prosthesis for community moblity modified independent.  (Target Date: 04/16/2015)   Time 2   Period Months   Status New   Additional Long Term Goals   Additional Long Term Goals Yes   PT LONG TERM GOAL #6   Title Patient negotiates stairs, ramps & curbs with LRAD & prosthesis for community mobility modified independent.  (Target Date: 04/16/2015)   Time 2   Period Months   Status New   PT LONG TERM GOAL #7   Title Patient reports chronic back & left knee pain increase </= 2 increments on 0-10 scale with above standing activiities.  (Target Date: 04/16/2015)   Time 2   Period Months   Status New           Plan - 03/03/15 1029    Clinical Impression Statement Pt has better understanding of sock ply management for fit and comfort. Most likely ready for tibial pads to be added to socket. Pt making steady progress toward goals.    Pt will benefit from skilled therapeutic intervention in order to improve on the following deficits Abnormal gait;Decreased activity  tolerance;Decreased balance;Decreased endurance;Decreased knowledge of use of DME;Decreased mobility;Decreased strength;Impaired flexibility;Postural dysfunction;Prosthetic Dependency;Pain   Rehab Potential Good   PT Frequency 2x / week   PT Duration Other (comment)  9 weeks (60 days)   PT Treatment/Interventions ADLs/Self Care Home Management;DME Instruction;Gait training;Stair training;Functional mobility training;Therapeutic exercise;Therapeutic activities;Balance training;Neuromuscular re-education;Patient/family education;Prosthetic Training   PT Next Visit Plan review prosthetic care, gait with rollator walker including barriers and outdoor paved surfaces, balance activities   Consulted and Agree with Plan of Care Patient        Problem List Patient Active Problem List   Diagnosis Date Noted  . Seizure ([place]) 11/13/2014  . Acute renal failure ([place]) 10/29/2014  . Cellulitis of foot 10/24/2014  . Hyponatremia 10/24/2014  . Hypokalemia 10/24/2014  . Diabetic infection of right foot ([place]) 10/24/2014  . DM (diabetes mellitus), type 2, uncontrolled ([place])   . Epilepsy ([place])   . COPD (chronic obstructive pulmonary disease) ([place])   . Bipolar 1 disorder ([place])   . OCD (obsessive compulsive disorder)   . Hypertension   . COLD (chronic obstructive lung disease) ([place])   . Essential hypertension   . Other epilepsy without status epilepticus, not intractable ([place])     Willow Ora 03/04/2015, 4:30 PM  Willow Ora, PTA, [facility] [address], [place], [place] [address] [phone] 03/04/2015, 4:30 PM

## 2015-03-08 ENCOUNTER — Ambulatory Visit: Payer: Medicare Other | Admitting: Physical Therapy

## 2015-03-10 ENCOUNTER — Ambulatory Visit: Payer: Medicare Other | Admitting: Physical Therapy

## 2015-03-10 ENCOUNTER — Encounter: Payer: Self-pay | Admitting: Physical Therapy

## 2015-03-10 DIAGNOSIS — R269 Unspecified abnormalities of gait and mobility: Secondary | ICD-10-CM

## 2015-03-10 DIAGNOSIS — R2681 Unsteadiness on feet: Secondary | ICD-10-CM

## 2015-03-10 DIAGNOSIS — R2689 Other abnormalities of gait and mobility: Secondary | ICD-10-CM

## 2015-03-10 DIAGNOSIS — Z4789 Encounter for other orthopedic aftercare: Secondary | ICD-10-CM

## 2015-03-10 DIAGNOSIS — R531 Weakness: Secondary | ICD-10-CM

## 2015-03-10 DIAGNOSIS — R6889 Other general symptoms and signs: Secondary | ICD-10-CM

## 2015-03-10 NOTE — Therapy (Signed)
[facility] [address] [place], [place], [address] Phone: [phone]   Fax:  [phone]  Physical Therapy Treatment  Patient Details  Name: Mallory Potts MRN: [id number] Date of Birth: 08-26-1965 No Data Recorded  Encounter Date: 03/10/2015      PT End of Session - 03/10/15 1108    Visit Number 5   Number of Visits 18   Date for PT Re-Evaluation 04/16/15   Authorization Type G-Code & progress report every 10th visit   PT Start Time 1102   PT Stop Time 1144   PT Time Calculation (min) 42 min   Equipment Utilized During Treatment Gait belt   Activity Tolerance Patient tolerated treatment well   Behavior During Therapy [facility] for tasks assessed/performed      Past Medical History  Diagnosis Date  . Diabetes mellitus   . Asthma   . Arthritis   . Hypertension   . Epilepsy ([place])   . Seizures ([place])   . Thyroid disease   . COPD (chronic obstructive pulmonary disease) ([place])   . Bipolar 1 disorder ([place])   . Peripheral edema   . OCD (obsessive compulsive disorder)   . COPD (chronic obstructive pulmonary disease) ([place])   . Depression   . Anxiety     Past Surgical History  Procedure Laterality Date  . Tonsillectomy    . Knee arthroscopy    . Amputation Right 10/24/2014    Procedure: AMPUTATION RAY RIGHT SECOND TOE;  Surgeon: Netta Cedars, MD;  [facility];  Service: Orthopedics;  Laterality: Right;  . Amputation Right 10/27/2014    Procedure: RIGHT BELOW KNEE AMPUTATION;  Surgeon: [name], MD;  Location: [facility];  Service: Orthopedics;  Laterality: Right;    There were no vitals filed for this visit.  Visit Diagnosis:  Abnormality of gait  Unsteadiness  Balance problems  Decreased functional activity tolerance  Weakness generalized  Encounter for prosthetic gait training      Subjective Assessment - 03/10/15 1105    Subjective No new complaints. No falls, on near fall in kitchen while reaching  for a fying pan, caught herself on counter top. It was her left knee "locking up" that caused it, did not have her knee brace on.    Currently in Pain? Yes   Pain Score 5    Pain Location Knee   Pain Orientation Left   Pain Descriptors / Indicators Aching;Sore   Pain Type Chronic pain   Pain Onset More than a month ago   Pain Frequency Intermittent   Aggravating Factors  OA, movement and activity   Pain Relieving Factors rest, brace helps with activity/movement           [facility] Adult PT Treatment/Exercise - 03/10/15 1109    Transfers   Sit to Stand 5: Supervision;With upper extremity assist;With armrests;From chair/3-in-1   Sit to Stand Details Verbal cues for technique;Verbal cues for safe use of DME/AE   Stand to Sit 5: Supervision;With upper extremity assist;With armrests;To chair/3-in-1   Stand to Sit Details (indicate cue type and reason) Verbal cues for safe use of DME/AE;Verbal cues for technique   Ambulation/Gait   Ambulation/Gait Yes   Ambulation/Gait Assistance 4: Min guard   Ambulation/Gait Assistance Details cues on posture, to clear right foot with swing phase and for equal step length.   Ambulation Distance (Feet) 500 Feet  x1 indoor/outdoor combined; 220 x2   Assistive device Prosthesis;Rollator   Gait Pattern Step-through pattern;Decreased step length -  left;Decreased stance time - right;Decreased hip/knee flexion - right;Decreased weight shift to right;Right hip hike;Right foot flat;Decreased dorsiflexion - left;Trunk flexed;Abducted- right;Poor foot clearance - right   Ambulation Surface Level;Indoor;Unlevel;Outdoor;Paved   Stairs Yes   Stairs Assistance 5: Supervision   Stairs Assistance Details (indicate cue type and reason) cues on technique;pt leads with her left leg down due to left knee pain and right leg is her strongest   Stair Management Technique Two rails;Step to pattern;Forwards   Number of Stairs 4   Ramp 5: Supervision  with prosthesis/rollator    Ramp Details (indicate cue type and reason) cues on posture and technique/sequence   Curb Other (comment)  min guard assist with rollator/prosthesis   Curb Details (indicate cue type and reason) cues on posture and rollator management  x 1 outdoors and x1 indoors   Prosthetics   Prosthetic Care Comments  cues on sock ply adjustment and pt reporting improved fit afterwards   Current prosthetic wear tolerance (days/week)  daily   Current prosthetic wear tolerance (#hours/day)  5 hours 2x day   Residual limb condition  intact per pt   Education Provided Residual limb care  drying    Person(s) Educated Patient   Education Method Explanation   Education Method Verbalized understanding;Verbal cues required   Donning Prosthesis Supervision   Doffing Prosthesis Supervision          PT Short Term Goals - 02/15/15 1100    PT SHORT TERM GOAL #1   Title Patient tolerates wear of prosthesis >10hrs total /day without skin issues or residual limb discomfort. (Target Date: 03/17/2015)   Time 1   Period Months   Status New   PT SHORT TERM GOAL #2   Title Patient performs standing balance activities with RW & prosthesis reaching 10" all directions, to floor and managing clothes modified independent. (Target Date: 03/17/2015)   Time 1   Period Months   Status New   PT SHORT TERM GOAL #3   Title Patient verbalizes proper cleaning & residual limb checking and demonstrates proper donning of prosthesis modified indepedent. (Target Date: 03/17/2015)   Time 1   Period Months   Status New   PT SHORT TERM GOAL #4   Title Patient ambulates 300' with RW & prosthesis with cues only safely. (Target Date: 03/17/2015)   Time 1   Period Months   Status New   PT SHORT TERM GOAL #5   Title Patient negotiates ramps & curbs with RW & prosthesis with cues only safely. (Target Date: 03/17/2015)   Time 1   Period Months   Status New   Additional Short Term Goals   Additional Short Term Goals Yes   PT SHORT  TERM GOAL #6   Title Patient reports chronic pain in back & left knee increase < 2 increments from beginning of that session with above activities. (Target Date: 03/17/2015)   Time 1   Period Months   Status New           PT Long Term Goals - 02/15/15 1100    PT LONG TERM GOAL #1   Title Patient tolerates prosthesis wear >90% of awake hours without skin issues or undue tenderness of limb. (Target Date: 04/16/2015)   Time 2   Period Months   Status New   PT LONG TERM GOAL #2   Title Patient verbalizes proper prosthetic care including managing sweat & adjusting ply socks correctly.  (Target Date: 04/16/2015)   Time 2   Period  Months   Status New   PT LONG TERM GOAL #3   Title Berg Balance >30/56  (Target Date: 04/16/2015)   Time 2   Period Months   Status New   PT LONG TERM GOAL #4   Title Patient ambulates at household level with cane or less & prosthesis carrying cup or plate modified independent.  (Target Date: 04/16/2015)   Time 2   Period Months   Status New   PT LONG TERM GOAL #5   Title Patient ambulates 400' with LRAD & prosthesis for community moblity modified independent.  (Target Date: 04/16/2015)   Time 2   Period Months   Status New   Additional Long Term Goals   Additional Long Term Goals Yes   PT LONG TERM GOAL #6   Title Patient negotiates stairs, ramps & curbs with LRAD & prosthesis for community mobility modified independent.  (Target Date: 04/16/2015)   Time 2   Period Months   Status New   PT LONG TERM GOAL #7   Title Patient reports chronic back & left knee pain increase </= 2 increments on 0-10 scale with above standing activiities.  (Target Date: 04/16/2015)   Time 2   Period Months   Status New           Plan - 03/10/15 1109    Clinical Impression Statement Pt making steady progress toward goals.   Pt will benefit from skilled therapeutic intervention in order to improve on the following deficits Abnormal gait;Decreased activity  tolerance;Decreased balance;Decreased endurance;Decreased knowledge of use of DME;Decreased mobility;Decreased strength;Impaired flexibility;Postural dysfunction;Prosthetic Dependency;Pain   Rehab Potential Good   PT Frequency 2x / week   PT Duration Other (comment)  9 weeks (60 days)   PT Treatment/Interventions ADLs/Self Care Home Management;DME Instruction;Gait training;Stair training;Functional mobility training;Therapeutic exercise;Therapeutic activities;Balance training;Neuromuscular re-education;Patient/family education;Prosthetic Training   PT Next Visit Plan continue with gait training, ? try straight cane. balance activities   Consulted and Agree with Plan of Care Patient      Problem List Patient Active Problem List   Diagnosis Date Noted  . Seizure ([place]) 11/13/2014  . Acute renal failure ([place]) 10/29/2014  . Cellulitis of foot 10/24/2014  . Hyponatremia 10/24/2014  . Hypokalemia 10/24/2014  . Diabetic infection of right foot ([place]) 10/24/2014  . DM (diabetes mellitus), type 2, uncontrolled ([place])   . Epilepsy ([place])   . COPD (chronic obstructive pulmonary disease) ([place])   . Bipolar 1 disorder ([place])   . OCD (obsessive compulsive disorder)   . Hypertension   . COLD (chronic obstructive lung disease) ([place])   . Essential hypertension   . Other epilepsy without status epilepticus, not intractable ([place])     Willow Ora 03/10/2015, 11:47 AM  Willow Ora, PTA, [facility] [address] [place], [place] [address] [phone] 03/10/2015, 11:47 AM   Name: Mallory Potts MRN: [id number] Date of Birth: 11-25-1965

## 2015-03-11 ENCOUNTER — Encounter: Payer: Self-pay | Admitting: Physical Therapy

## 2015-03-11 ENCOUNTER — Ambulatory Visit: Payer: Medicare Other | Admitting: Physical Therapy

## 2015-03-11 DIAGNOSIS — Z4789 Encounter for other orthopedic aftercare: Secondary | ICD-10-CM

## 2015-03-11 DIAGNOSIS — R2689 Other abnormalities of gait and mobility: Secondary | ICD-10-CM

## 2015-03-11 DIAGNOSIS — R269 Unspecified abnormalities of gait and mobility: Secondary | ICD-10-CM | POA: Diagnosis not present

## 2015-03-11 DIAGNOSIS — R2681 Unsteadiness on feet: Secondary | ICD-10-CM

## 2015-03-11 DIAGNOSIS — M2560 Stiffness of unspecified joint, not elsewhere classified: Secondary | ICD-10-CM

## 2015-03-11 DIAGNOSIS — R6889 Other general symptoms and signs: Secondary | ICD-10-CM

## 2015-03-11 DIAGNOSIS — Z7409 Other reduced mobility: Secondary | ICD-10-CM

## 2015-03-11 DIAGNOSIS — R531 Weakness: Secondary | ICD-10-CM

## 2015-03-11 DIAGNOSIS — Z89511 Acquired absence of right leg below knee: Secondary | ICD-10-CM

## 2015-03-12 NOTE — Therapy (Signed)
[facility] [address] [place], [place], [address] Phone: [phone]   Fax:  [phone]  Physical Therapy Treatment  Patient Details  Name: Mallory Potts MRN: [id number] Date of Birth: 06-27-1965 Referring Provider: [name], MD  Encounter Date: 03/11/2015      PT End of Session - 03/11/15 1315    Visit Number 7   Number of Visits 18   Date for PT Re-Evaluation 04/16/15   Authorization Type G-Code & progress report every 10th visit   PT Start Time 1320   PT Stop Time 1404   PT Time Calculation (min) 44 min   Equipment Utilized During Treatment Gait belt   Activity Tolerance Patient tolerated treatment well   Behavior During Therapy [facility] for tasks assessed/performed      Past Medical History  Diagnosis Date  . Diabetes mellitus   . Asthma   . Arthritis   . Hypertension   . Epilepsy ([place])   . Seizures ([place])   . Thyroid disease   . COPD (chronic obstructive pulmonary disease) ([place])   . Bipolar 1 disorder ([place])   . Peripheral edema   . OCD (obsessive compulsive disorder)   . COPD (chronic obstructive pulmonary disease) ([place])   . Depression   . Anxiety     Past Surgical History  Procedure Laterality Date  . Tonsillectomy    . Knee arthroscopy    . Amputation Right 10/24/2014    Procedure: AMPUTATION RAY RIGHT SECOND TOE;  Surgeon: Netta Cedars, MD;  Location: [facility];  Service: Orthopedics;  Laterality: Right;  . Amputation Right 10/27/2014    Procedure: RIGHT BELOW KNEE AMPUTATION;  Surgeon: [name], MD;  Location: [facility];  Service: Orthopedics;  Laterality: Right;    There were no vitals filed for this visit.  Visit Diagnosis:  Abnormality of gait  Unsteadiness  Balance problems  Decreased functional activity tolerance  Weakness generalized  Encounter for prosthetic gait training  Status post below knee amputation of right lower extremity (HCC)  Stiffness due to immobility       Subjective Assessment - 03/11/15 1315    Subjective Patient reports no falls or near falls. She has started using her knee brace as advised last session.   Currently in Pain? Yes   Pain Score 5    Pain Location Knee   Pain Orientation Left   Pain Descriptors / Indicators Aching;Sore   Pain Type Chronic pain   Pain Onset More than a month ago   Pain Frequency Intermittent   Aggravating Factors  OA, movement and activity   Pain Relieving Factors rest, brace with activity            [facility] PT Assessment - 03/11/15 1315    Assessment   Referring Provider [name], MD                     [facility] Adult PT Treatment/Exercise - 03/11/15 1315    Transfers   Sit to Stand 5: Supervision;With upper extremity assist;With armrests;From chair/3-in-1   Sit to Stand Details Verbal cues for technique;Verbal cues for safe use of DME/AE   Sit to Stand Details (indicate cue type and reason) verbal cues on engaging prosthesis   Stand to Sit 5: Supervision;With upper extremity assist;With armrests;To chair/3-in-1   Stand to Sit Details (indicate cue type and reason) Verbal cues for safe use of DME/AE;Verbal cues for technique   Stand to Sit Details verbal cues on engaging the  prosthesis   Ambulation/Gait   Ambulation/Gait Yes   Ambulation/Gait Assistance 4: Min assist;4: Min guard;5: Supervision  MinA with cane progressed to Min guard, SBA RW   Ambulation/Gait Assistance Details Verbal cues on rollator walker management / brakes, safety; ambulated with single point cane with std tip & quad tip; Verbal & tactile cues on sequence and step length- pt does better with cane contralateral to OA knee over prosthesis.    Ambulation Distance (Feet) 500 Feet  500' with rollator walker, 79' X 5 with cane   Assistive device Prosthesis;Rollator;Straight cane   Gait Pattern Step-through pattern;Decreased step length - left;Decreased stance time - right;Decreased hip/knee flexion - right;Decreased  weight shift to right;Right hip hike;Right foot flat;Decreased dorsiflexion - left;Trunk flexed;Abducted- right;Poor foot clearance - right   Ambulation Surface Indoor;Level;Outdoor;Paved   Stairs Yes   Stairs Assistance 5: Supervision   Stairs Assistance Details (indicate cue type and reason) cues on protectin left knee with OA pain using modified technique with left knee as weaker knee, cues on ascending with prosthesis first technique and descending with left leg using prosthesis eccentrically to control motion   Stair Management Technique Two rails;Step to pattern;Forwards;Alternating pattern   Number of Stairs 4  5 reps   Ramp 5: Supervision  with prosthesis/rollator   Ramp Details (indicate cue type and reason) cues on rollator walker control and technique   Curb Other (comment)  min guard assist with rollator/prosthesis   Curb Details (indicate cue type and reason) cues on technique with rollator walker & prosthesis   Prosthetics   Prosthetic Care Comments  PT instructed in signs of sweating and drying to decrease skin issues   Current prosthetic wear tolerance (days/week)  daily   Current prosthetic wear tolerance (#hours/day)  Increase to all awake hours except 2 hours mid-day   Residual limb condition  intact without issues   Education Provided Residual limb care;Correct ply sock adjustment  drying    Person(s) Educated Patient   Education Method Explanation;Verbal cues   Education Method Verbalized understanding;Verbal cues required;Needs further instruction                  PT Short Term Goals - 02/15/15 1100    PT SHORT TERM GOAL #1   Title Patient tolerates wear of prosthesis >10hrs total /day without skin issues or residual limb discomfort. (Target Date: 03/17/2015)   Time 1   Period Months   Status New   PT SHORT TERM GOAL #2   Title Patient performs standing balance activities with RW & prosthesis reaching 10" all directions, to floor and managing clothes  modified independent. (Target Date: 03/17/2015)   Time 1   Period Months   Status New   PT SHORT TERM GOAL #3   Title Patient verbalizes proper cleaning & residual limb checking and demonstrates proper donning of prosthesis modified indepedent. (Target Date: 03/17/2015)   Time 1   Period Months   Status New   PT SHORT TERM GOAL #4   Title Patient ambulates 300' with RW & prosthesis with cues only safely. (Target Date: 03/17/2015)   Time 1   Period Months   Status New   PT SHORT TERM GOAL #5   Title Patient negotiates ramps & curbs with RW & prosthesis with cues only safely. (Target Date: 03/17/2015)   Time 1   Period Months   Status New   Additional Short Term Goals   Additional Short Term Goals Yes   PT SHORT TERM GOAL #6  Title Patient reports chronic pain in back & left knee increase < 2 increments from beginning of that session with above activities. (Target Date: 03/17/2015)   Time 1   Period Months   Status New           PT Long Term Goals - 02/15/15 1100    PT LONG TERM GOAL #1   Title Patient tolerates prosthesis wear >90% of awake hours without skin issues or undue tenderness of limb. (Target Date: 04/16/2015)   Time 2   Period Months   Status New   PT LONG TERM GOAL #2   Title Patient verbalizes proper prosthetic care including managing sweat & adjusting ply socks correctly.  (Target Date: 04/16/2015)   Time 2   Period Months   Status New   PT LONG TERM GOAL #3   Title Berg Balance >30/56  (Target Date: 04/16/2015)   Time 2   Period Months   Status New   PT LONG TERM GOAL #4   Title Patient ambulates at household level with cane or less & prosthesis carrying cup or plate modified independent.  (Target Date: 04/16/2015)   Time 2   Period Months   Status New   PT LONG TERM GOAL #5   Title Patient ambulates 400' with LRAD & prosthesis for community moblity modified independent.  (Target Date: 04/16/2015)   Time 2   Period Months   Status New    Additional Long Term Goals   Additional Long Term Goals Yes   PT LONG TERM GOAL #6   Title Patient negotiates stairs, ramps & curbs with LRAD & prosthesis for community mobility modified independent.  (Target Date: 04/16/2015)   Time 2   Period Months   Status New   PT LONG TERM GOAL #7   Title Patient reports chronic back & left knee pain increase </= 2 increments on 0-10 scale with above standing activiities.  (Target Date: 04/16/2015)   Time 2   Period Months   Status New               Plan - 03/11/15 1315    Clinical Impression Statement Patient appears to have greater issues with gait and standing activities with weakness & pain from OA left knee now than with her right Transtibial prosthesis. Therefore she does better using cane in contralateral UE to left LE and changing stair sequence to protect her left knee.   Pt will benefit from skilled therapeutic intervention in order to improve on the following deficits Abnormal gait;Decreased activity tolerance;Decreased balance;Decreased endurance;Decreased knowledge of use of DME;Decreased mobility;Decreased strength;Impaired flexibility;Postural dysfunction;Prosthetic Dependency;Pain   Rehab Potential Good   PT Frequency 2x / week   PT Duration Other (comment)  9 weeks (60 days)   PT Treatment/Interventions ADLs/Self Care Home Management;DME Instruction;Gait training;Stair training;Functional mobility training;Therapeutic exercise;Therapeutic activities;Balance training;Neuromuscular re-education;Patient/family education;Prosthetic Training   PT Next Visit Plan continue with gait training community with rollator walker and household with straight cane. balance activities   Consulted and Agree with Plan of Care Patient        Problem List Patient Active Problem List   Diagnosis Date Noted  . Seizure ([place]) 11/13/2014  . Acute renal failure ([place]) 10/29/2014  . Cellulitis of foot 10/24/2014  . Hyponatremia 10/24/2014  .  Hypokalemia 10/24/2014  . Diabetic infection of right foot ([place]) 10/24/2014  . DM (diabetes mellitus), type 2, uncontrolled ([place])   . Epilepsy ([place])   . COPD (chronic obstructive pulmonary disease) ([place])   .  Bipolar 1 disorder ([place])   . OCD (obsessive compulsive disorder)   . Hypertension   . COLD (chronic obstructive lung disease) ([place])   . Essential hypertension   . Other epilepsy without status epilepticus, not intractable ([place])     Josephus Harriger PT, DPT 03/12/2015, 10:13 AM  [facility] [address] [place], [place], [address] Phone: [phone]   Fax:  [phone]  Name: Andia Perrin MRN: [id number] Date of Birth: 1965/10/23

## 2015-03-15 ENCOUNTER — Ambulatory Visit: Payer: Medicare Other | Admitting: Physical Therapy

## 2015-03-15 ENCOUNTER — Encounter: Payer: Self-pay | Admitting: Physical Therapy

## 2015-03-15 DIAGNOSIS — R2689 Other abnormalities of gait and mobility: Secondary | ICD-10-CM

## 2015-03-15 DIAGNOSIS — R269 Unspecified abnormalities of gait and mobility: Secondary | ICD-10-CM

## 2015-03-15 DIAGNOSIS — R2681 Unsteadiness on feet: Secondary | ICD-10-CM

## 2015-03-15 DIAGNOSIS — Z4789 Encounter for other orthopedic aftercare: Secondary | ICD-10-CM

## 2015-03-15 DIAGNOSIS — R6889 Other general symptoms and signs: Secondary | ICD-10-CM

## 2015-03-15 DIAGNOSIS — R531 Weakness: Secondary | ICD-10-CM

## 2015-03-15 NOTE — Therapy (Signed)
[facility] [address] [place], [place], [address] Phone: [phone]   Fax:  [phone]  Physical Therapy Treatment  Patient Details  Name: Mallory Potts MRN: [id number] Date of Birth: 06-12-1965 Referring Provider: [name], MD  Encounter Date: 03/15/2015      PT End of Session - 03/15/15 1344    Visit Number 8   Number of Visits 18   Date for PT Re-Evaluation 04/16/15   Authorization Type G-Code & progress report every 10th visit   PT Start Time 1115   PT Stop Time 1200   PT Time Calculation (min) 45 min   Equipment Utilized During Treatment Gait belt   Activity Tolerance Patient tolerated treatment well   Behavior During Therapy [facility] for tasks assessed/performed      Past Medical History  Diagnosis Date  . Diabetes mellitus   . Asthma   . Arthritis   . Hypertension   . Epilepsy ([place])   . Seizures ([place])   . Thyroid disease   . COPD (chronic obstructive pulmonary disease) ([place])   . Bipolar 1 disorder ([place])   . Peripheral edema   . OCD (obsessive compulsive disorder)   . COPD (chronic obstructive pulmonary disease) ([place])   . Depression   . Anxiety     Past Surgical History  Procedure Laterality Date  . Tonsillectomy    . Knee arthroscopy    . Amputation Right 10/24/2014    Procedure: AMPUTATION RAY RIGHT SECOND TOE;  Surgeon: Netta Cedars, MD;  Location: [facility];  Service: Orthopedics;  Laterality: Right;  . Amputation Right 10/27/2014    Procedure: RIGHT BELOW KNEE AMPUTATION;  Surgeon: [name], MD;  Location: [facility];  Service: Orthopedics;  Laterality: Right;    There were no vitals filed for this visit.  Visit Diagnosis:  Abnormality of gait  Unsteadiness  Balance problems  Decreased functional activity tolerance  Weakness generalized  Encounter for prosthetic gait training      Subjective Assessment - 03/15/15 1300    Subjective Pt. stated that her pain reached an 8/10 after  extended WB at a party on Sunday. Pt. stated 5/10 pain before treatment and a 7/10 pain during treatment. Pt. stated that she feels more comfortable on ambulating with her cane. Pt. has a goal of walking her dog with a leash when she has completed treatment.    Currently in Pain? Yes   Pain Score 5    Pain Location Knee   Pain Orientation Left   Pain Descriptors / Indicators Aching;Sore   Pain Type Chronic pain   Pain Onset More than a month ago   Pain Frequency Constant   Aggravating Factors  weight bearing   Pain Relieving Factors removing prosthesis           [facility] Adult PT Treatment/Exercise - 03/15/15 0001    Transfers   Sit to Stand 6: Modified independent (Device/Increase time);With upper extremity assist;From chair/3-in-1;With armrests   Stand to Sit 6: Modified independent (Device/Increase time);With upper extremity assist;With armrests;To chair/3-in-1   Ambulation/Gait   Ambulation/Gait Yes   Ambulation/Gait Assistance 4: Min guard  Pt. tripped over her prosthesis 2 times during treatment.   Ambulation/Gait Assistance Details Verbal cues to stay inside walker and for Right foot clearance.   Ambulation Distance (Feet) 330 Feet  x1, 500 x1 RW, 438f. x1 with cane rest breaks x1.   Assistive device Rolling walker;Prosthesis;Straight cane  reported that she feels more comfortable with cane  Gait Pattern Step-through pattern;Decreased stride length;Decreased stance time - right;Trunk flexed;Narrow base of support;Poor foot clearance - right   Ambulation Surface Level;Indoor;Outdoor;Paved   Stairs Yes   Stairs Assistance 5: Supervision   Stairs Assistance Details (indicate cue type and reason) Pt. mod. I with using modified techniques on stairs to protect left knee.   Stair Management Technique Two rails, step to pattern, forward.   Number of Stairs 4   Ramp 5: Supervision   Ramp Details (indicate cue type and reason) cues to stay closer to walker.   Curb --  Pt. min guard  assist with RW on curb.   Curb Details (indicate cue type and reason) Cues for one foot at a time when descending the curb.   Balance   Balance Assessed No   Standardized Balance Assessment   Standardized Balance Assessment --  Pt. achieved goal of reaching 10 in. with B UE's with RW sup   Prosthetics   Prosthetic Care Comments  Pt. able to stand with RW and reach 10 inches in all directions with B UE's, and also demonstrated I with clothing management.   Current prosthetic wear tolerance (days/week)  daily   Current prosthetic wear tolerance (#hours/day)  Increase to all awake hours except 2 hours mid-day   Education Provided --  Pt. was instructed on how wear tall socks with prosthesis.   Person(s) Educated Patient   Education Method Explanation;Verbal cues   Education Method Verbalized understanding   Donning Prosthesis Supervision   Doffing Prosthesis Supervision            PT Short Term Goals - 03/15/15 1348    PT SHORT TERM GOAL #1   Title Patient tolerates wear of prosthesis >10hrs total /day without skin issues or residual limb discomfort. (Target Date: 03/17/2015)   Time 1   Period Months   Status On-going   PT SHORT TERM GOAL #2   Title Patient performs standing balance activities with RW & prosthesis reaching 10" all directions, to floor and managing clothes modified independent. (Target Date: 03/17/2015)   Time 1   Period Months   Status Achieved   PT SHORT TERM GOAL #3   Title Patient verbalizes proper cleaning & residual limb checking and demonstrates proper donning of prosthesis modified indepedent. (Target Date: 03/17/2015)   Time 1   Period Months   Status Achieved   PT SHORT TERM GOAL #4   Title Patient ambulates 300' with RW & prosthesis with cues only safely. (Target Date: 03/17/2015)   Time 1   Period Months   Status Achieved   PT SHORT TERM GOAL #5   Title Patient negotiates ramps & curbs with RW & prosthesis with cues only safely. (Target Date:  03/17/2015)   Time 1   Period Months   Status Achieved   PT SHORT TERM GOAL #6   Title Patient reports chronic pain in back & left knee increase < 2 increments from beginning of that session with above activities. (Target Date: 03/17/2015)   Time 1   Period Months   Status New           PT Long Term Goals - 02/15/15 1100    PT LONG TERM GOAL #1   Title Patient tolerates prosthesis wear >90% of awake hours without skin issues or undue tenderness of limb. (Target Date: 04/16/2015)   Time 2   Period Months   Status New   PT LONG TERM GOAL #2   Title Patient verbalizes proper prosthetic  care including managing sweat & adjusting ply socks correctly.  (Target Date: 04/16/2015)   Time 2   Period Months   Status New   PT LONG TERM GOAL #3   Title Berg Balance >30/56  (Target Date: 04/16/2015)   Time 2   Period Months   Status New   PT LONG TERM GOAL #4   Title Patient ambulates at household level with cane or less & prosthesis carrying cup or plate modified independent.  (Target Date: 04/16/2015)   Time 2   Period Months   Status New   PT LONG TERM GOAL #5   Title Patient ambulates 400' with LRAD & prosthesis for community moblity modified independent.  (Target Date: 04/16/2015)   Time 2   Period Months   Status New   Additional Long Term Goals   Additional Long Term Goals Yes   PT LONG TERM GOAL #6   Title Patient negotiates stairs, ramps & curbs with LRAD & prosthesis for community mobility modified independent.  (Target Date: 04/16/2015)   Time 2   Period Months   Status New   PT LONG TERM GOAL #7   Title Patient reports chronic back & left knee pain increase </= 2 increments on 0-10 scale with above standing activiities.  (Target Date: 04/16/2015)   Time 2   Period Months   Status New           Plan - 03/15/15 1309    Clinical Impression Statement Pt. has met 4 out of 5 of her STG's. Pt. making progress towards all others goals. Pt. still needs cues for safety.    Pt will benefit from skilled therapeutic intervention in order to improve on the following deficits Abnormal gait;Decreased activity tolerance;Decreased balance;Decreased endurance;Decreased knowledge of use of DME;Decreased mobility;Decreased strength;Impaired flexibility;Postural dysfunction;Prosthetic Dependency;Pain   Rehab Potential Good   PT Frequency 2x / week   PT Duration Other (comment)   PT Treatment/Interventions ADLs/Self Care Home Management;DME Instruction;Gait training;Stair training;Functional mobility training;Therapeutic exercise;Therapeutic activities;Balance training;Neuromuscular re-education;Patient/family education;Prosthetic Training   PT Next Visit Plan Start working towards LTG's. Ambulation with cane while carrying household items and emphasis on balance activities.   Consulted and Agree with Plan of Care Patient        Problem List Patient Active Problem List   Diagnosis Date Noted  . Seizure ([place]) 11/13/2014  . Acute renal failure ([place]) 10/29/2014  . Cellulitis of foot 10/24/2014  . Hyponatremia 10/24/2014  . Hypokalemia 10/24/2014  . Diabetic infection of right foot ([place]) 10/24/2014  . DM (diabetes mellitus), type 2, uncontrolled ([place])   . Epilepsy ([place])   . COPD (chronic obstructive pulmonary disease) ([place])   . Bipolar 1 disorder ([place])   . OCD (obsessive compulsive disorder)   . Hypertension   . COLD (chronic obstructive lung disease) ([place])   . Essential hypertension   . Other epilepsy without status epilepticus, not intractable ([place])     Laney Potash 03/15/2015, 1:54 PM  Laney Potash SPTA  Name: Judithe Keetch MRN: [id number] Date of Birth: 06/09/1965  Note reviewed with student and edits made under CI guidance.   Willow Ora, PTA, [facility] [address], [place], [place] [address] [phone] 03/15/2015, 9:53 PM

## 2015-03-17 ENCOUNTER — Encounter: Payer: Self-pay | Admitting: Physical Therapy

## 2015-03-17 ENCOUNTER — Ambulatory Visit: Payer: Medicare Other | Admitting: Physical Therapy

## 2015-03-17 DIAGNOSIS — R269 Unspecified abnormalities of gait and mobility: Secondary | ICD-10-CM

## 2015-03-17 DIAGNOSIS — R2689 Other abnormalities of gait and mobility: Secondary | ICD-10-CM

## 2015-03-17 DIAGNOSIS — Z4789 Encounter for other orthopedic aftercare: Secondary | ICD-10-CM

## 2015-03-17 DIAGNOSIS — R2681 Unsteadiness on feet: Secondary | ICD-10-CM

## 2015-03-17 DIAGNOSIS — R6889 Other general symptoms and signs: Secondary | ICD-10-CM

## 2015-03-17 NOTE — Therapy (Signed)
[facility] [address] [place], [place], [address] Phone: [phone]   Fax:  [phone]  Physical Therapy Treatment  Patient Details  Name: Mallory Potts MRN: [id number] Date of Birth: August 07, 1965 Referring Provider: [name], MD  Encounter Date: 03/17/2015      PT End of Session - 03/17/15 1125    Visit Number 9   Number of Visits 19   Date for PT Re-Evaluation 04/16/15   Authorization Type G-Code & progress report every 10th visit   PT Start Time 1021   PT Stop Time 1100   PT Time Calculation (min) 39 min   Equipment Utilized During Treatment Gait belt   Activity Tolerance Patient limited by pain   Behavior During Therapy [facility] for tasks assessed/performed      Past Medical History  Diagnosis Date  . Diabetes mellitus   . Asthma   . Arthritis   . Hypertension   . Epilepsy ([place])   . Seizures ([place])   . Thyroid disease   . COPD (chronic obstructive pulmonary disease) ([place])   . Bipolar 1 disorder ([place])   . Peripheral edema   . OCD (obsessive compulsive disorder)   . COPD (chronic obstructive pulmonary disease) ([place])   . Depression   . Anxiety     Past Surgical History  Procedure Laterality Date  . Tonsillectomy    . Knee arthroscopy    . Amputation Right 10/24/2014    Procedure: AMPUTATION RAY RIGHT SECOND TOE;  Surgeon: Netta Cedars, MD;  Location: [facility];  Service: Orthopedics;  Laterality: Right;  . Amputation Right 10/27/2014    Procedure: RIGHT BELOW KNEE AMPUTATION;  Surgeon: [name], MD;  Location: [facility];  Service: Orthopedics;  Laterality: Right;    There were no vitals filed for this visit.  Visit Diagnosis:  Abnormality of gait  Balance problems  Unsteadiness  Encounter for prosthetic gait training  Decreased functional activity tolerance      Subjective Assessment - 03/17/15 1113    Subjective Pt. reported her therapy was going well. Pt. showed understanding of her residual  limb care. Pt. stated that she needs more training on curbs and ramps to feel confident. Pt reported 5/10 pain upon arrival and 8/10 pain after curbs. "Coming down off of the curb makes my left knee hurt."   Patient Stated Goals to be able to walk in home & community, possibly dance   Currently in Pain? Yes   Pain Score 5    Pain Location Knee   Pain Orientation Left   Pain Descriptors / Indicators Aching;Sore   Pain Type Chronic pain   Pain Onset More than a month ago   Pain Frequency Constant   Aggravating Factors  weight bearing   Effect of Pain on Daily Activities limits her time in Weight bearing with prosthesis.   Multiple Pain Sites No          [facility] Adult PT Treatment/Exercise - 03/17/15 0001    Transfers   Sit to Stand 6: Modified independent (Device/Increase time)   Stand to Sit 6: Modified independent (Device/Increase time)   Ambulation/Gait   Ambulation/Gait Yes   Ambulation/Gait Assistance 4: Min guard   Ambulation Distance (Feet) 500 Feet  500 ft. outside, 100 ft., around track, 100 ft.around track   Assistive device Rollator;Straight cane  Rollator was used for outside ambulation.   Gait Pattern Step-through pattern   Ambulation Surface Indoor;Level;Outdoor;Paved;Unlevel   Stairs Yes   Stairs Assistance 4: Min  guard  stairs were completed with straight cane.   Stair Management Technique One rail Left;With cane   Number of Stairs 16   Ramp 4: Min assist with cane/prosthesis, blocked practice   Ramp Details (indicate cue type and reason) --  verbal cues for toe walking up and heel walking down.   Curb 3: Mod assist with cane/prosthesis, blocked practice   Curb Details (indicate cue type and reason) --  verbal cues to advance R toes off of curb to provide ROM.   Prosthetics   Current prosthetic wear tolerance (days/week)  daily   Current prosthetic wear tolerance (#hours/day)  Pt. is up to 7hrs. per day now.   Residual limb condition  Normal per pt. comments    Education Provided Residual limb care   Person(s) Educated Patient   Education Method Explanation   Education Method Verbalized understanding           PT Education - 03/17/15 1120    Education provided Yes   Education Details Therapist checked with pt. to make sure that she was drying prosthesis sleave/limb during wear hours to avoid sweat build up and not have heat loss in the R LE. Pt. stated that she was. Pt. also educated on proper curb and ramp tech for ascending and descending. Therapist explained to pt. where to install grab bar on her bedroom doorway to aid in ascending and descending ramp in her house.   Person(s) Educated Patient   Methods Explanation   Comprehension Verbalized understanding          PT Short Term Goals - 03/17/15 1129    PT SHORT TERM GOAL #1   Title Patient tolerates wear of prosthesis >10hrs total /day without skin issues or residual limb discomfort. (Target Date: 03/17/2015)   Time 1   Period Months   Status On-going   PT SHORT TERM GOAL #2   Title Patient performs standing balance activities with RW & prosthesis reaching 10" all directions, to floor and managing clothes modified independent. (Target Date: 03/17/2015)   Time 1   Period Months   Status Achieved   PT SHORT TERM GOAL #3   Title Patient verbalizes proper cleaning & residual limb checking and demonstrates proper donning of prosthesis modified indepedent. (Target Date: 03/17/2015)   Time 1   Period Months   Status Achieved   PT SHORT TERM GOAL #4   Title Patient ambulates 300' with RW & prosthesis with cues only safely. (Target Date: 03/17/2015)   Time 1   Period Months   Status Achieved   PT SHORT TERM GOAL #5   Title Patient negotiates ramps & curbs with RW & prosthesis with cues only safely. (Target Date: 03/17/2015)   Time 1   Period Months   Status Achieved   PT SHORT TERM GOAL #6   Title Patient reports chronic pain in back & left knee increase < 2 increments from  beginning of that session with above activities. (Target Date: 03/17/2015)   Time 1   Period Months   Status On-going           PT Long Term Goals - 02/15/15 1100    PT LONG TERM GOAL #1   Title Patient tolerates prosthesis wear >90% of awake hours without skin issues or undue tenderness of limb. (Target Date: 04/16/2015)   Time 2   Period Months   Status New   PT LONG TERM GOAL #2   Title Patient verbalizes proper prosthetic care including managing sweat &  adjusting ply socks correctly.  (Target Date: 04/16/2015)   Time 2   Period Months   Status New   PT LONG TERM GOAL #3   Title Berg Balance >30/56  (Target Date: 04/16/2015)   Time 2   Period Months   Status New   PT LONG TERM GOAL #4   Title Patient ambulates at household level with cane or less & prosthesis carrying cup or plate modified independent.  (Target Date: 04/16/2015)   Time 2   Period Months   Status New   PT LONG TERM GOAL #5   Title Patient ambulates 400' with LRAD & prosthesis for community moblity modified independent.  (Target Date: 04/16/2015)   Time 2   Period Months   Status New   Additional Long Term Goals   Additional Long Term Goals Yes   PT LONG TERM GOAL #6   Title Patient negotiates stairs, ramps & curbs with LRAD & prosthesis for community mobility modified independent.  (Target Date: 04/16/2015)   Time 2   Period Months   Status New   PT LONG TERM GOAL #7   Title Patient reports chronic back & left knee pain increase </= 2 increments on 0-10 scale with above standing activiities.  (Target Date: 04/16/2015)   Time 2   Period Months   Status New           Plan - 03/18/15 1242    Clinical Impression Statement Pt. is progressing towards STG's 1 and 6 and has achieved all other STG's.    Pt will benefit from skilled therapeutic intervention in order to improve on the following deficits Abnormal gait;Pain;Decreased endurance;Decreased strength;Decreased balance   Rehab Potential Good    PT Frequency 2x / week   PT Duration 8 weeks   PT Treatment/Interventions ADLs/Self Care Home Management;DME Instruction;Gait training;Stair training;Functional mobility training;Therapeutic exercise;Therapeutic activities;Balance training;Neuromuscular re-education;Patient/family education;Prosthetic Training   PT Next Visit Plan Start working towards LTG's. Ambulation with cane while carrying household items and emphasis on balance activities.        Problem List Patient Active Problem List   Diagnosis Date Noted  . Seizure ([place]) 11/13/2014  . Acute renal failure ([place]) 10/29/2014  . Cellulitis of foot 10/24/2014  . Hyponatremia 10/24/2014  . Hypokalemia 10/24/2014  . Diabetic infection of right foot ([place]) 10/24/2014  . DM (diabetes mellitus), type 2, uncontrolled ([place])   . Epilepsy ([place])   . COPD (chronic obstructive pulmonary disease) ([place])   . Bipolar 1 disorder ([place])   . OCD (obsessive compulsive disorder)   . Hypertension   . COLD (chronic obstructive lung disease) ([place])   . Essential hypertension   . Other epilepsy without status epilepticus, not intractable ([place])     Laney Potash 03/18/2015, 12:50 PM  Laney Potash, [place]  Name: Lorieann Heart MRN: [id number] Date of Birth: Aug 09, 1965  This note has been reviewed and edited by supervision CI.  Willow Ora, PTA, [facility] [address], [place], [place] [address] [phone] 03/18/2015, 2:32 PM

## 2015-03-22 ENCOUNTER — Ambulatory Visit: Payer: Medicare Other | Admitting: Physical Therapy

## 2015-03-22 ENCOUNTER — Encounter: Payer: Self-pay | Admitting: Physical Therapy

## 2015-03-22 DIAGNOSIS — R531 Weakness: Secondary | ICD-10-CM

## 2015-03-22 DIAGNOSIS — Z89511 Acquired absence of right leg below knee: Secondary | ICD-10-CM

## 2015-03-22 DIAGNOSIS — R269 Unspecified abnormalities of gait and mobility: Secondary | ICD-10-CM | POA: Diagnosis not present

## 2015-03-22 DIAGNOSIS — R2689 Other abnormalities of gait and mobility: Secondary | ICD-10-CM

## 2015-03-22 DIAGNOSIS — M2560 Stiffness of unspecified joint, not elsewhere classified: Secondary | ICD-10-CM

## 2015-03-22 DIAGNOSIS — R6889 Other general symptoms and signs: Secondary | ICD-10-CM

## 2015-03-22 DIAGNOSIS — Z7409 Other reduced mobility: Secondary | ICD-10-CM

## 2015-03-22 DIAGNOSIS — Z4789 Encounter for other orthopedic aftercare: Secondary | ICD-10-CM

## 2015-03-22 DIAGNOSIS — R2681 Unsteadiness on feet: Secondary | ICD-10-CM

## 2015-03-22 NOTE — Therapy (Addendum)
[facility] [address] [place], [place], [address] Phone: [phone]   Fax:  [phone]  Physical Therapy Treatment  Patient Details  Name: Mallory Potts MRN: [id number] Date of Birth: 1965-10-16 Referring Provider: [name], MD  Encounter Date: 03/22/2015   03/22/15 2312  PT Visits / Re-Eval  Visit Number 10  Number of Visits 19  Date for PT Re-Evaluation 04/16/15  Authorization  Authorization Type G-Code & progress report every 10th visit  PT Time Calculation  PT Start Time 1015  PT Stop Time 1100  PT Time Calculation (min) 45 min  PT - End of Session  Equipment Utilized During Treatment Gait belt  Activity Tolerance Patient limited by pain  Behavior During Therapy [facility] for tasks assessed/performed     Past Medical History  Diagnosis Date  . Diabetes mellitus   . Asthma   . Arthritis   . Hypertension   . Epilepsy ([place])   . Seizures ([place])   . Thyroid disease   . COPD (chronic obstructive pulmonary disease) ([place])   . Bipolar 1 disorder ([place])   . Peripheral edema   . OCD (obsessive compulsive disorder)   . COPD (chronic obstructive pulmonary disease) ([place])   . Depression   . Anxiety     Past Surgical History  Procedure Laterality Date  . Tonsillectomy    . Knee arthroscopy    . Amputation Right 10/24/2014    Procedure: AMPUTATION RAY RIGHT SECOND TOE;  Surgeon: Netta Cedars, MD;  Location[facility];  Service: Orthopedics;  Laterality: Right;  . Amputation Right 10/27/2014    Procedure: RIGHT BELOW KNEE AMPUTATION;  Surgeon: [name], MD;  Location: [facility];  Service: Orthopedics;  Laterality: Right;    There were no vitals filed for this visit.  Visit Diagnosis:  Abnormality of gait  Balance problems  Unsteadiness  Encounter for prosthetic gait training  Decreased functional activity tolerance  Weakness generalized  Status post below knee amputation of right lower extremity  (HCC)  Stiffness due to immobility      Subjective Assessment - 03/22/15 1109    Subjective Pt. reported no new falls or any other pertinent information. Pt said that she had not put up a grab bar in the bedroom doorway, and that she was comfortable doing it her current way. Pt. reported that her pain in left knee was 0 when she arrived, but that during hurdles and grass with cane the pain increased to an 8/10.   Patient Stated Goals to be able to walk in home & community, possibly dance   Currently in Pain? Yes   Pain Score 8    Pain Location Knee   Pain Orientation Left   Pain Descriptors / Indicators Aching;Sore   Pain Type Chronic pain   Pain Onset More than a month ago   Pain Frequency Intermittent   Aggravating Factors  weight bearing   Pain Relieving Factors removing prosthesis   Effect of Pain on Daily Activities limits time in weight bearing.   Multiple Pain Sites No           PT Education - 03/22/15 1117    Education Details Pt. educated on indoor and outdoor ambulation with assistive device. Pt. was educated to walk with cane inside. Pt. stated that sometimes she had to hurry to restroom and didnt have time for the cane, but therapist advised against this to increase safety.  Therapist also made sure that pt. understood to  walk outside  with rolling walker.   Person(s) Educated Patient   Methods Explanation   Comprehension Verbalized understanding     Prosthetic Training: To improve balance, coordination, and decrease functional limitations.  Standing at mat in walker: 2 pound ball tosses in different direction X20 reps. Then on AirX X20 to increase challenge. Pt. Was able to catch her balance with UE support on RW.  Parallel bar support: Lateral stepping with 2 pound ball tosses.  4 Black foam hurdles: X4 laps forward and X4 laps lateral. Alternating leading foot. Verbal cues for extra knee flexion on Left to avoid tripping over cane.  Walking: Outdoor, grass.  For a total of 240 ft. With cane in order to challenge balance. Pt. advised not to use cane for  outdoor ambulation at home.      PT Short Term Goals - 03/22/15 1122    PT SHORT TERM GOAL #1   Title Patient tolerates wear of prosthesis >10hrs total /day without skin issues or residual limb discomfort. (Target Date: 03/17/2015)   Baseline achieved on 03/22/15   Time 1   Period Months   Status Achieved   PT SHORT TERM GOAL #2   Title Patient performs standing balance activities with RW & prosthesis reaching 10" all directions, to floor and managing clothes modified independent. (Target Date: 03/17/2015)   Time 1   Period Months   Status Achieved   PT SHORT TERM GOAL #3   Title Patient verbalizes proper cleaning & residual limb checking and demonstrates proper donning of prosthesis modified indepedent. (Target Date: 03/17/2015)   Time 1   Period Months   Status Achieved   PT SHORT TERM GOAL #4   Title Patient ambulates 300' with RW & prosthesis with cues only safely. (Target Date: 03/17/2015)   Time 1   Period Months   Status Achieved   PT SHORT TERM GOAL #5   Title Patient negotiates ramps & curbs with RW & prosthesis with cues only safely. (Target Date: 03/17/2015)   Time 1   Period Months   Status Achieved   PT SHORT TERM GOAL #6   Title Patient reports chronic pain in back & left knee increase < 2 increments from beginning of that session with above activities. (Target Date: 03/17/2015)   Time 1   Period Months   Status On-going           PT Long Term Goals - 02/15/15 1100    PT LONG TERM GOAL #1   Title Patient tolerates prosthesis wear >90% of awake hours without skin issues or undue tenderness of limb. (Target Date: 04/16/2015)   Time 2   Period Months   Status New   PT LONG TERM GOAL #2   Title Patient verbalizes proper prosthetic care including managing sweat & adjusting ply socks correctly.  (Target Date: 04/16/2015)   Time 2   Period Months   Status New    PT LONG TERM GOAL #3   Title Berg Balance >30/56  (Target Date: 04/16/2015)   Time 2   Period Months   Status New   PT LONG TERM GOAL #4   Title Patient ambulates at household level with cane or less & prosthesis carrying cup or plate modified independent.  (Target Date: 04/16/2015)   Time 2   Period Months   Status New   PT LONG TERM GOAL #5   Title Patient ambulates 400' with LRAD & prosthesis for community moblity modified independent.  (Target Date: 04/16/2015)   Time 2  Period Months   Status New   Additional Long Term Goals   Additional Long Term Goals Yes   PT LONG TERM GOAL #6   Title Patient negotiates stairs, ramps & curbs with LRAD & prosthesis for community mobility modified independent.  (Target Date: 04/16/2015)   Time 2   Period Months   Status New   PT LONG TERM GOAL #7   Title Patient reports chronic back & left knee pain increase </= 2 increments on 0-10 scale with above standing activiities.  (Target Date: 04/16/2015)   Time 2   Period Months   Status New           Plan - 03/22/15 1122    Clinical Impression Statement Pt. has achieved STG #1 and LTG #1. Pt. still limited in activity by balance, but is progressing steadily. Pt. still limited by left knee pain. This treatment the pain was the worst so far. Pt. continues to be motivated to improve.    Pt will benefit from skilled therapeutic intervention in order to improve on the following deficits Abnormal gait;Pain;Decreased endurance;Decreased strength;Decreased balance   Rehab Potential Good   PT Frequency 2x / week   PT Duration 8 weeks   PT Treatment/Interventions ADLs/Self Care Home Management;DME Instruction;Gait training;Stair training;Functional mobility training;Therapeutic exercise;Therapeutic activities;Balance training;Neuromuscular re-education;Patient/family education;Prosthetic Training   PT Next Visit Plan Start working towards LTG's. Ambulation with cane while carrying household items and  emphasis on balance activities. Continue working on dynamic balance activities to decrease risk of falls. Educate on pacing to reduce pain in the left knee   Consulted and Agree with Plan of Care Patient        Problem List Patient Active Problem List   Diagnosis Date Noted  . Seizure ([place]) 11/13/2014  . Acute renal failure ([place]) 10/29/2014  . Cellulitis of foot 10/24/2014  . Hyponatremia 10/24/2014  . Hypokalemia 10/24/2014  . Diabetic infection of right foot ([place]) 10/24/2014  . DM (diabetes mellitus), type 2, uncontrolled ([place])   . Epilepsy ([place])   . COPD (chronic obstructive pulmonary disease) ([place])   . Bipolar 1 disorder ([place])   . OCD (obsessive compulsive disorder)   . Hypertension   . COLD (chronic obstructive lung disease) ([place])   . Essential hypertension   . Other epilepsy without status epilepticus, not intractable ([place])     Laney Potash 03/22/2015, 11:34 AM  Laney Potash, SPTA  Name: Mallory Potts MRN: [id number] Date of Birth: [birth year]  This note has been reviewed and edited by supervision CI.  This entire session was performed under direct supervision and direction of a licensed therapist/therapist assistant . I have personally read, edited and approve of the note as written.  Willow Ora, PTA, [facility] [address] [place], [place] [address] [phone] 03/22/2015, 11:11 PM

## 2015-03-24 ENCOUNTER — Encounter: Payer: Self-pay | Admitting: Physical Therapy

## 2015-03-24 ENCOUNTER — Ambulatory Visit: Payer: Medicare Other | Attending: Orthopedic Surgery | Admitting: Physical Therapy

## 2015-03-24 DIAGNOSIS — Z89511 Acquired absence of right leg below knee: Secondary | ICD-10-CM | POA: Insufficient documentation

## 2015-03-24 DIAGNOSIS — M623 Immobility syndrome (paraplegic): Secondary | ICD-10-CM | POA: Insufficient documentation

## 2015-03-24 DIAGNOSIS — Z5189 Encounter for other specified aftercare: Secondary | ICD-10-CM | POA: Insufficient documentation

## 2015-03-24 DIAGNOSIS — R2681 Unsteadiness on feet: Secondary | ICD-10-CM | POA: Insufficient documentation

## 2015-03-24 DIAGNOSIS — R29818 Other symptoms and signs involving the nervous system: Secondary | ICD-10-CM | POA: Diagnosis present

## 2015-03-24 DIAGNOSIS — R2689 Other abnormalities of gait and mobility: Secondary | ICD-10-CM

## 2015-03-24 DIAGNOSIS — R6889 Other general symptoms and signs: Secondary | ICD-10-CM | POA: Insufficient documentation

## 2015-03-24 DIAGNOSIS — Z4789 Encounter for other orthopedic aftercare: Secondary | ICD-10-CM

## 2015-03-24 DIAGNOSIS — R269 Unspecified abnormalities of gait and mobility: Secondary | ICD-10-CM | POA: Diagnosis present

## 2015-03-24 DIAGNOSIS — M2560 Stiffness of unspecified joint, not elsewhere classified: Secondary | ICD-10-CM

## 2015-03-24 DIAGNOSIS — R531 Weakness: Secondary | ICD-10-CM | POA: Diagnosis present

## 2015-03-24 DIAGNOSIS — Z7409 Other reduced mobility: Secondary | ICD-10-CM

## 2015-03-24 NOTE — Therapy (Addendum)
[facility] [address] [place], [place], [address] Phone: [phone]   Fax:  [phone]  Physical Therapy Treatment  Patient Details  Name: Mallory Potts MRN: [id number] Date of Birth: [birth year] Referring Provider: [name], MD  Encounter Date: 03/24/2015      PT End of Session - 03/24/15 1217    Visit Number 11   Number of Visits 19   Date for PT Re-Evaluation 04/16/15   Authorization Type G-Code & progress report every 10th visit   PT Start Time 1100   PT Stop Time 1145   PT Time Calculation (min) 45 min   Equipment Utilized During Treatment Gait belt   Activity Tolerance Patient limited by pain   Behavior During Therapy [facility] for tasks assessed/performed      Past Medical History  Diagnosis Date  . Diabetes mellitus   . Asthma   . Arthritis   . Hypertension   . Epilepsy ([place])   . Seizures ([place])   . Thyroid disease   . COPD (chronic obstructive pulmonary disease) ([place])   . Bipolar 1 disorder ([place])   . Peripheral edema   . OCD (obsessive compulsive disorder)   . COPD (chronic obstructive pulmonary disease) ([place])   . Depression   . Anxiety     Past Surgical History  Procedure Laterality Date  . Tonsillectomy    . Knee arthroscopy    . Amputation Right 10/24/2014    Procedure: AMPUTATION RAY RIGHT SECOND TOE;  Surgeon: Netta Cedars, MD;  Location: [facility];  Service: Orthopedics;  Laterality: Right;  . Amputation Right 10/27/2014    Procedure: RIGHT BELOW KNEE AMPUTATION;  Surgeon: [name], MD;  Location: [facility];  Service: Orthopedics;  Laterality: Right;    There were no vitals filed for this visit.  Visit Diagnosis:  Abnormality of gait  Balance problems  Unsteadiness  Encounter for prosthetic gait training  Decreased functional activity tolerance  Weakness generalized  Status post below knee amputation of right lower extremity (HCC)  Stiffness due to immobility      Subjective  Assessment - 03/24/15 1158    Subjective Pt. reported no new falls. "I walked to the mailbox and back yesterday." 0/10 pain in lower back at beginning of treatment.  5/10 pain in L knee at beginning of treatment. Pt's pain increased to 7/10 with walking 557ft. Outdoors with cane.   Patient Stated Goals to be able to walk in home & community, possibly dance. "I want to walk my dog down the street."   Currently in Pain? Yes   Pain Score 5    Pain Location Knee   Pain Orientation Left   Pain Type Chronic pain   Pain Onset More than a month ago   Pain Frequency Intermittent   Aggravating Factors  weight bearing   Pain Relieving Factors rest and removing prsthesis   Effect of Pain on Daily Activities limits time in weight bearing activity.   Multiple Pain Sites Yes   Pain Score --  Pain score not taken. Right hip pain initiated during  tandem walking.   Pain Location Hip   Pain Orientation Right   Pain Descriptors / Indicators Burning;Stabbing   Pain Type Chronic pain;Acute pain   Pain Onset Today   Pain Frequency Rarely   Aggravating Factors  tandem walking   Effect of Pain on Daily Activities none           PT Education - 03/24/15 1209  Education provided Yes   Education Details Pt. educated on skin care of residual limb (use of baby oil and deoderant), and increasing daily prosthesis wear to 90% of awake hours, and removing and drying every 4 hours. Pt. educated on fall prevention (having AD close by, bed and shower being the only place to have prosthesis of)f.    Person(s) Educated Patient   Methods Explanation   Comprehension Verbalized understanding     Prosthetics training: See education. To increase strength balance and endurance to increase pt. Functional independence with R below knee prosthesis.   Prosthesis was removed for skin check and education. Pt's skin WNL's. Pt. Wore the prosthesis for the duration of session after initial education.  Standing balance with  Parallel Bar support: Tandem walking forward and back X4 laps with min guard assist. Cues to limit hip external rotation to avoid excess ankle inversion.  Side stepping X4 laps with min guard assist. Static standing with eyes closed with supervision X3sets 10 sec., 10 sec., and 20 sec. Pt. with LOB, but was able to right herself with parallel bar support.  Walking with eyes shut 142ft. (to simulate walking in limited light). Tandem walking with eyes closed. Exercise was terminated to due to pain.  Gait: Pt. Ambulated 57ft. with min guard assist with cane on outdoor, paved, and grass surfaces with tactile & verbal cues on balance reactions and foot clearance Pt's gait quality declined ~400' due to pain, weakness and lack of endurance with increased assist to MinA. Therapist also simulated walking the dog with leash with pt with intermittent pulling and tactile cues for reaction for balance. Patient requires further work before attempting with her dog at home.  Pt. Was educated on rest breaks for pain management to assure that pain does not increase in the low back and left knee more than two increments from baseline.       PT Short Term Goals - 03/24/15 1227    PT SHORT TERM GOAL #1   Title Patient tolerates wear of prosthesis >10hrs total /day without skin issues or residual limb discomfort. (Target Date: 03/17/2015)   Baseline achieved on 03/22/15   Time 1   Period Months   Status Achieved   PT SHORT TERM GOAL #2   Title Patient performs standing balance activities with RW & prosthesis reaching 10" all directions, to floor and managing clothes modified independent. (Target Date: 03/17/2015)   Time 1   Period Months   Status Achieved   PT SHORT TERM GOAL #3   Title Patient verbalizes proper cleaning & residual limb checking and demonstrates proper donning of prosthesis modified indepedent. (Target Date: 03/17/2015)   Time 1   Period Months   Status Achieved   PT SHORT TERM GOAL #4    Title Patient ambulates 300' with RW & prosthesis with cues only safely. (Target Date: 03/17/2015)   Time 1   Period Months   Status Achieved   PT SHORT TERM GOAL #5   Title Patient negotiates ramps & curbs with RW & prosthesis with cues only safely. (Target Date: 03/17/2015)   Time 1   Period Months   Status Achieved   PT SHORT TERM GOAL #6   Title Patient reports chronic pain in back & left knee increase < 2 increments from beginning of that session with above activities. (Target Date: 03/17/2015)   Time 1   Period Months   Status On-going           PT Long Term  Goals - 03/24/15 1227    PT LONG TERM GOAL #1   Title Patient tolerates prosthesis wear >90% of awake hours without skin issues or undue tenderness of limb. (Target Date: 04/16/2015)   Time 2   Period Months   Status New   PT LONG TERM GOAL #2   Title Patient verbalizes proper prosthetic care including managing sweat & adjusting ply socks correctly.  (Target Date: 04/16/2015)   Time 2   Period Months   Status New   PT LONG TERM GOAL #3   Title Berg Balance >30/56  (Target Date: 04/16/2015)   Time 2   Period Months   Status New   PT LONG TERM GOAL #4   Title Patient ambulates at household level with cane or less & prosthesis carrying cup or plate modified independent.  (Target Date: 04/16/2015)   Time 2   Period Months   Status New   PT LONG TERM GOAL #5   Title Patient ambulates 400' with LRAD & prosthesis for community moblity modified independent.  (Target Date: 04/16/2015)   Time 2   Period Months   Status New   PT LONG TERM GOAL #6   Title Patient negotiates stairs, ramps & curbs with LRAD & prosthesis for community mobility modified independent.  (Target Date: 04/16/2015)   Time 2   Period Months   Status New   PT LONG TERM GOAL #7   Title Patient reports chronic back & left knee pain increase </= 2 increments on 0-10 scale with above standing activiities.  (Target Date: 04/16/2015)   Time 2    Period Months   Status New           Plan - 03/24/15 1218    Clinical Impression Statement Pt. is progressing toward all long term goals. Pt. still limited by endurance and Left knee pain. Pt. is able to ambulate 534ft. on outdoor, paved and unlevel surfaces, but does present with gait abnormalities due to weakness at 476ft.   Pt will benefit from skilled therapeutic intervention in order to improve on the following deficits Abnormal gait;Pain;Decreased endurance;Decreased strength;Decreased balance   Rehab Potential Good   PT Frequency 2x / week   PT Duration 8 weeks   PT Treatment/Interventions ADLs/Self Care Home Management;DME Instruction;Gait training;Stair training;Functional mobility training;Therapeutic exercise;Therapeutic activities;Balance training;Neuromuscular re-education;Patient/family education;Prosthetic Training   PT Next Visit Plan Start working towards LTG's. Ambulation with cane while carrying household items and emphasis on balance activities. Continue working on dynamic balance activities to decrease risk of falls. Educate on pacing to reduce pain in the left knee.  Work on going up ramp on wooden ramp and treadmill to increase independence with home ambulation.    Consulted and Agree with Plan of Care Patient        Problem List Patient Active Problem List   Diagnosis Date Noted  . Seizure ([place]) 11/13/2014  . Acute renal failure ([place]) 10/29/2014  . Cellulitis of foot 10/24/2014  . Hyponatremia 10/24/2014  . Hypokalemia 10/24/2014  . Diabetic infection of right foot ([place]) 10/24/2014  . DM (diabetes mellitus), type 2, uncontrolled ([place])   . Epilepsy ([place])   . COPD (chronic obstructive pulmonary disease) ([place])   . Bipolar 1 disorder ([place])   . OCD (obsessive compulsive disorder)   . Hypertension   . COLD (chronic obstructive lung disease) ([place])   . Essential hypertension   . Other epilepsy without status epilepticus, not intractable ([place])     Laney Potash 03/24/2015, 12:45  PM  Laney Potash, [place]  Name: Xianna Lupe MRN: [id number] Date of Birth: 1965-08-06

## 2015-03-25 ENCOUNTER — Encounter: Payer: Self-pay | Admitting: Physical Therapy

## 2015-03-25 NOTE — Therapy (Signed)
[facility] [address] [place], [place], [address] Phone: [phone]   Fax:  [phone]  Patient Details  Name: Mallory Potts MRN: [id number] Date of Birth: 01/17/1966 Referring Provider:  No ref. provider found  Encounter Date: 03/25/2015  Physical Therapy Progress Note  Dates of Reporting Period: 02/15/2015 to 03/22/2015  Objective Reports of Subjective Statement: Patient reports wearing prosthesis and able to use it with her walker in home and to enter/exit her home.  Objective Measurements: Patient tolerating wear 6 hrs 2x/day (~75% of her awake hours) with skin issues or limb pain. She requires cues on managing skin issues and adjusting ply socks.  Goal Update:      PT Short Term Goals - 03/24/15 1227    PT SHORT TERM GOAL #1   Title Patient tolerates wear of prosthesis >10hrs total /day without skin issues or residual limb discomfort. (Target Date: 03/17/2015)   Baseline achieved on 03/22/15   Time 1   Period Months   Status Achieved   PT SHORT TERM GOAL #2   Title Patient performs standing balance activities with RW & prosthesis reaching 10" all directions, to floor and managing clothes modified independent. (Target Date: 03/17/2015)   Time 1   Period Months   Status Achieved   PT SHORT TERM GOAL #3   Title Patient verbalizes proper cleaning & residual limb checking and demonstrates proper donning of prosthesis modified indepedent. (Target Date: 03/17/2015)   Time 1   Period Months   Status Achieved   PT SHORT TERM GOAL #4   Title Patient ambulates 300' with RW & prosthesis with cues only safely. (Target Date: 03/17/2015)   Time 1   Period Months   Status Achieved   PT SHORT TERM GOAL #5   Title Patient negotiates ramps & curbs with RW & prosthesis with cues only safely. (Target Date: 03/17/2015)   Time 1   Period Months   Status Achieved   PT SHORT TERM GOAL #6   Title Patient reports chronic pain  in back & left knee increase < 2 increments from beginning of that session with above activities. (Target Date: 03/17/2015)   Time 1   Period Months   Status On-going      Plan: Prosthetic training and cont plan of care towards LTGs  Reason Skilled Services are Required: Patient requires skilled instruction for safe prosthesis wear & function including gait & balance.    Jamey Reas PT, DPT 03/25/2015, 1:26 PM  [facility] [address] [place], [place], [address] Phone: [phone]   Fax:  [phone]

## 2015-03-29 ENCOUNTER — Ambulatory Visit: Payer: Medicare Other | Admitting: Physical Therapy

## 2015-03-29 DIAGNOSIS — M2560 Stiffness of unspecified joint, not elsewhere classified: Secondary | ICD-10-CM

## 2015-03-29 DIAGNOSIS — R2689 Other abnormalities of gait and mobility: Secondary | ICD-10-CM

## 2015-03-29 DIAGNOSIS — R6889 Other general symptoms and signs: Secondary | ICD-10-CM

## 2015-03-29 DIAGNOSIS — Z89511 Acquired absence of right leg below knee: Secondary | ICD-10-CM

## 2015-03-29 DIAGNOSIS — Z7409 Other reduced mobility: Secondary | ICD-10-CM

## 2015-03-29 DIAGNOSIS — R2681 Unsteadiness on feet: Secondary | ICD-10-CM

## 2015-03-29 DIAGNOSIS — R269 Unspecified abnormalities of gait and mobility: Secondary | ICD-10-CM | POA: Diagnosis not present

## 2015-03-29 DIAGNOSIS — R531 Weakness: Secondary | ICD-10-CM

## 2015-03-29 DIAGNOSIS — Z4789 Encounter for other orthopedic aftercare: Secondary | ICD-10-CM

## 2015-03-29 NOTE — Therapy (Signed)
[facility] [address] [place], [place], [address] Phone: [phone]   Fax:  [phone]  Physical Therapy Treatment  Patient Details  Name: Mallory Potts MRN: [id number] Date of Birth: 1966/02/17 Referring Provider: [name], MD  Encounter Date: 03/29/2015      PT End of Session - 03/29/15 1215    Visit Number 12   Number of Visits 19   Date for PT Re-Evaluation 04/16/15   Authorization Type G-Code & progress report every 10th visit   PT Start Time 1100   PT Stop Time 1145   PT Time Calculation (min) 45 min   Equipment Utilized During Treatment Gait belt   Activity Tolerance Patient tolerated treatment well   Behavior During Therapy [facility] for tasks assessed/performed      Past Medical History  Diagnosis Date  . Diabetes mellitus   . Asthma   . Arthritis   . Hypertension   . Epilepsy ([place])   . Seizures ([place])   . Thyroid disease   . COPD (chronic obstructive pulmonary disease) ([place])   . Bipolar 1 disorder ([place])   . Peripheral edema   . OCD (obsessive compulsive disorder)   . COPD (chronic obstructive pulmonary disease) ([place])   . Depression   . Anxiety     Past Surgical History  Procedure Laterality Date  . Tonsillectomy    . Knee arthroscopy    . Amputation Right 10/24/2014    Procedure: AMPUTATION RAY RIGHT SECOND TOE;  Surgeon: Netta Cedars, MD;  Location: [facility];  Service: Orthopedics;  Laterality: Right;  . Amputation Right 10/27/2014    Procedure: RIGHT BELOW KNEE AMPUTATION;  Surgeon: [name], MD;  Location: [facility];  Service: Orthopedics;  Laterality: Right;    There were no vitals filed for this visit.  Visit Diagnosis:  Abnormality of gait  Balance problems  Unsteadiness  Encounter for prosthetic gait training  Decreased functional activity tolerance  Weakness generalized  Status post below knee amputation of right lower extremity (HCC)  Stiffness due to immobility       Subjective Assessment - 03/29/15 1156    Subjective No reports of new falls. "My family does not trust me to walk by myself with my RW". Pt. reported 5/10 pain before treatment, and 7/10 pain after treatment. Pt is now wearing prosthesis all day except to clean and dry, napping on bed, or bathing.    Patient Stated Goals to be able to walk in home & community, possibly dance. "I want to walk my dog down the street.   Currently in Pain? Yes   Pain Score 7    Pain Location Knee   Pain Orientation Left   Pain Descriptors / Indicators Aching;Sore   Pain Type Chronic pain   Pain Onset More than a month ago   Pain Frequency Constant   Aggravating Factors  weight bearing   Pain Relieving Factors rest and removing prosthesis   Effect of Pain on Daily Activities limits time in weight bearing activity.   Multiple Pain Sites Yes   Pain Score --  Pt. did not give rating.   Pain Location Knee  Pt. stated that pain in R knee was rare.   Pain Orientation Right   Pain Descriptors / Indicators Aching   Pain Type Acute pain   Pain Onset More than a month ago   Pain Frequency Rarely   Aggravating Factors  Pt. felt pain after working on incline, steps, and curb  Pain Relieving Factors Intermittent rest breaks.   Effect of Pain on Daily Activities None.           PT Education - 03/29/15 1204    Education provided Yes   Education Details Pt. educated on technique for steps with no hand rails like at her home with family assistance. PT also instructed with demo foot position with wt shifts to stabilize against resistance like walking her dog. PT instructed not to attempt at home until further practice in PT.     Person(s) Educated Patient   Methods Explanation, demonstration, verbal cues, tactile cues   Comprehension Verbalized understanding;Returned demonstration, needs further instruction     Prosthetic Training: Pt completed simulated uphill and downhill walking on treadmill with B UE  support, (Pt was min. Guard assist for duration of this intervention) to increase proprioceptive feedback with cues on step length and weight shift over feet to increase knee stability while ambulating on ramps. Pt was provided visual feedback with mirror during downhill ambulation. Pt required verbal cues to increase stride length, initial contact with heel and to focus on weight shift onto the prosthesis in both directions of ambulation. Pt. Educated on proper dismount from the treadmill for safety.  Pt. negotiated ramp ascend & descend x5 reps with Min. guard assist with verbal cues to carryover cues / technique from treadmill activity noted above.  Pt negotiated 2 reps on curb. Pt min guard assist with curbs with verbal cues on technique and min guard for safety (verbal cues for pain management)  Pt was trained to ascend and descend stairs with various rail options: wide rails so only able to use 1 rail, right rail / left cane, left rail / right cane. Progressed to 2 steps with no rails with single point cane and hand hold assist while instructing how family to assist safely. Patient verbalized understanding but needs further practice before attempting with family outside of PT. No hand rail with cane and family assist (see education),  Pt completed 43min of stationary stance weight shifting to facilitate ability to control her dog if he is pulling. PT instructed in proper foot position to improve stability and shifting weight between her feet. Pt received demonstration from PT and SPTA. Pt was instructed on using staggered stance and weight shifting forward and back as dog is pulling on leash. Pt was min guard assist with cues for weight shifting. Pt demonstrated competency with adjusting to pulls in different directions by automatically reorienting her stance to the direction of the pull. Pt advised not to try this at home as of yet.  Pt supervision to min guard with 371ft of gait total during  treatment on indoor, level surfaces. Patient had gait deviation of right pelvic drop with lateral trunk lean towards prosthesis indicating prosthesis was too short. PT used lifts in static stance to determine prosthesis ~3/8" too short. PT notified prosthetist who will correct at appointment on 04/02/2015. Total distance occurred intermittently during treatment.         PT Short Term Goals - 03/29/15 1319    PT SHORT TERM GOAL #1   Title Patient tolerates wear of prosthesis >10hrs total /day without skin issues or residual limb discomfort. (Target Date: 03/17/2015)   Baseline achieved on 03/22/15   Time 1   Period Months   Status Achieved   PT SHORT TERM GOAL #2   Title Patient performs standing balance activities with RW & prosthesis reaching 10" all directions, to floor and managing clothes  modified independent. (Target Date: 03/17/2015)   Time 1   Period Months   Status Achieved   PT SHORT TERM GOAL #3   Title Patient verbalizes proper cleaning & residual limb checking and demonstrates proper donning of prosthesis modified indepedent. (Target Date: 03/17/2015)   Time 1   Period Months   Status Achieved   PT SHORT TERM GOAL #4   Title Patient ambulates 300' with RW & prosthesis with cues only safely. (Target Date: 03/17/2015)   Time 1   Period Months   Status Achieved   PT SHORT TERM GOAL #5   Title Patient negotiates ramps & curbs with RW & prosthesis with cues only safely. (Target Date: 03/17/2015)   Time 1   Period Months   Status Achieved   PT SHORT TERM GOAL #6   Title Patient reports chronic pain in back & left knee increase < 2 increments from beginning of that session with above activities. (Target Date: 03/17/2015)   Time 1   Period Months   Status On-going           PT Long Term Goals - 03/29/15 1217    PT LONG TERM GOAL #1   Title Patient tolerates prosthesis wear >90% of awake hours without skin issues or undue tenderness of limb. (Target Date: 04/16/2015)    Time 2   Period Months   Status New   PT LONG TERM GOAL #2   Title Patient verbalizes proper prosthetic care including managing sweat & adjusting ply socks correctly.  (Target Date: 04/16/2015)   Time 2   Period Months   Status New   PT LONG TERM GOAL #3   Title Berg Balance >30/56  (Target Date: 04/16/2015)   Time 2   Period Months   Status New   PT LONG TERM GOAL #4   Title Patient ambulates at household level with cane or less & prosthesis carrying cup or plate modified independent.  (Target Date: 04/16/2015)   Time 2   Period Months   Status New   PT LONG TERM GOAL #5   Title Patient ambulates 400' with LRAD & prosthesis for community moblity modified independent.  (Target Date: 04/16/2015)   Time 2   Period Months   Status New   PT LONG TERM GOAL #6   Title Patient negotiates stairs, ramps & curbs with LRAD & prosthesis for community mobility modified independent.  (Target Date: 04/16/2015)   Time 2   Period Months   Status New   PT LONG TERM GOAL #7   Title Patient reports chronic back & left knee pain increase </= 2 increments on 0-10 scale with above standing activiities.  (Target Date: 04/16/2015)   Time 2   Period Months   Status New               Plan - 03/29/15 1216    Clinical Impression Statement Pt. is progressing toward remaining STG concerned with pain management (see subjective). Todays session focused on increasing proprioception to increase R knee stability for fall prevention (prosthetic training). Pt.'s R knee control improved throughout treatment. Pt. is supervision on steps with cues for weight shifting and hand placement. Pt required one seated rest break during treatment due to decreased activity tolerance and pain in left knee.   Pt will benefit from skilled therapeutic intervention in order to improve on the following deficits Abnormal gait;Pain;Decreased endurance;Decreased strength;Decreased balance   Rehab Potential Good   PT Frequency  2x / week  PT Duration 8 weeks   PT Treatment/Interventions ADLs/Self Care Home Management;DME Instruction;Gait training;Stair training;Functional mobility training;Therapeutic exercise;Therapeutic activities;Balance training;Neuromuscular re-education;Patient/family education;Prosthetic Training   PT Next Visit Plan Continue educating on pain management (continuing through pain can lead to increased inflammation and further damage. Walking dog in grass. Continue ramp and stair training to eventually meet goal of Mod. I assist.   Consulted and Agree with Plan of Care Patient        Problem List Patient Active Problem List   Diagnosis Date Noted  . Seizure ([place]) 11/13/2014  . Acute renal failure ([place]) 10/29/2014  . Cellulitis of foot 10/24/2014  . Hyponatremia 10/24/2014  . Hypokalemia 10/24/2014  . Diabetic infection of right foot ([place]) 10/24/2014  . DM (diabetes mellitus), type 2, uncontrolled ([place])   . Epilepsy ([place])   . COPD (chronic obstructive pulmonary disease) ([place])   . Bipolar 1 disorder ([place])   . OCD (obsessive compulsive disorder)   . Hypertension   . COLD (chronic obstructive lung disease) ([place])   . Essential hypertension   . Other epilepsy without status epilepticus, not intractable ([place])     Laney Potash 03/29/2015, 1:22 PM  [place], [place]  Name: Pearlean Soland MRN: [id number] Date of Birth: 1965/12/02   Jamey Reas, PT, DPT PT Specializing in [place] 03/30/2015 5:36 PM Phone:  [phone]  Fax:  [phone] [facility] [address] [place],  [address]

## 2015-03-31 ENCOUNTER — Ambulatory Visit: Payer: Medicare Other | Admitting: Physical Therapy

## 2015-03-31 ENCOUNTER — Encounter: Payer: Self-pay | Admitting: Physical Therapy

## 2015-03-31 DIAGNOSIS — R2681 Unsteadiness on feet: Secondary | ICD-10-CM

## 2015-03-31 DIAGNOSIS — R2689 Other abnormalities of gait and mobility: Secondary | ICD-10-CM

## 2015-03-31 DIAGNOSIS — R531 Weakness: Secondary | ICD-10-CM

## 2015-03-31 DIAGNOSIS — R6889 Other general symptoms and signs: Secondary | ICD-10-CM

## 2015-03-31 DIAGNOSIS — R269 Unspecified abnormalities of gait and mobility: Secondary | ICD-10-CM

## 2015-03-31 DIAGNOSIS — Z89511 Acquired absence of right leg below knee: Secondary | ICD-10-CM

## 2015-03-31 DIAGNOSIS — Z4789 Encounter for other orthopedic aftercare: Secondary | ICD-10-CM

## 2015-03-31 NOTE — Therapy (Signed)
[facility] [address] [place], [place], [address] Phone: [phone]   Fax:  [phone]  Physical Therapy Treatment  Patient Details  Name: Mallory Potts MRN: [id number] Date of Birth: April 27, 1966 Referring Provider: [name], MD  Encounter Date: 03/31/2015      PT End of Session - 03/31/15 1214    Visit Number 13   Number of Visits 19   Date for PT Re-Evaluation 04/16/15   Authorization Type G-Code & progress report every 10th visit   PT Start Time 1100   PT Stop Time 1145   PT Time Calculation (min) 45 min   Equipment Utilized During Treatment Gait belt   Activity Tolerance Patient tolerated treatment well   Behavior During Therapy [facility] for tasks assessed/performed      Past Medical History  Diagnosis Date  . Diabetes mellitus   . Asthma   . Arthritis   . Hypertension   . Epilepsy ([place])   . Seizures ([place])   . Thyroid disease   . COPD (chronic obstructive pulmonary disease) ([place])   . Bipolar 1 disorder ([place])   . Peripheral edema   . OCD (obsessive compulsive disorder)   . COPD (chronic obstructive pulmonary disease) ([place])   . Depression   . Anxiety     Past Surgical History  Procedure Laterality Date  . Tonsillectomy    . Knee arthroscopy    . Amputation Right 10/24/2014    Procedure: AMPUTATION RAY RIGHT SECOND TOE;  Surgeon: Netta Cedars, MD;  Location: [facility];  Service: Orthopedics;  Laterality: Right;  . Amputation Right 10/27/2014    Procedure: RIGHT BELOW KNEE AMPUTATION;  Surgeon: [name], MD;  Location: [facility];  Service: Orthopedics;  Laterality: Right;    There were no vitals filed for this visit.  Visit Diagnosis:  Abnormality of gait  Balance problems  Unsteadiness  Encounter for prosthetic gait training  Decreased functional activity tolerance  Weakness generalized  Status post below knee amputation of right lower extremity (HCC)      Subjective Assessment - 03/31/15  1111    Subjective Pt reports walking short distance to bathroom without her cane. Her doctor has increase neuroton prescription. Pt reports walking up her exterior ramp for the first time. "The ramp is very steep". Pt. reported 5/10 pain upon arrival, 7/10 pain after second gait trial, and 5/10 at end of treatment after seated rest break..   Patient Stated Goals to be able to walk in home & community, possibly dance. "I want to walk my dog down the street.   Currently in Pain? Yes   Pain Score 7    Pain Location Knee   Pain Orientation Left   Pain Descriptors / Indicators Aching;Sore   Pain Type Chronic pain   Pain Onset More than a month ago   Pain Frequency Constant   Aggravating Factors  weight bearing   Pain Relieving Factors rest and removing prosthesis.   Effect of Pain on Daily Activities limits time in weight bearing activity   Multiple Pain Sites Yes   Pain Score 7   Pain Location Back   Pain Orientation Lower  low back pain possibly related to short R prosthesis. Adjustment by prosthetist scheduled for Friday.   Pain Descriptors / Indicators Aching   Pain Type Chronic pain   Pain Onset More than a month ago   Pain Frequency Intermittent     Prosthetic Training: Pt completed 556ft of gait on outdoor, paved, and  grass surfaces with dog walking simulation (CI providing the stimulus of pulling at random intervals throughout). Pt min guard assist from SPTA. Pt required cues for weight shifting back for stability when dog pulls.  Pt. Ambulated 690 feet total on outdoor paved and grass surfaces, including uphill and downhill walking to simulate steep exterior ramp at her house. Pt min guard assist +2 during 2 trials of uphill/downhill gait. Also had pt close eyes on level surfaces intermittently  to challenge balance reactions by simulating dark/night environment.      PT Short Term Goals - 03/31/15 1218    PT SHORT TERM GOAL #1   Title Patient tolerates wear of prosthesis  >10hrs total /day without skin issues or residual limb discomfort. (Target Date: 03/17/2015)   Baseline achieved on 03/22/15   Time 1   Period Months   Status Achieved   PT SHORT TERM GOAL #2   Title Patient performs standing balance activities with RW & prosthesis reaching 10" all directions, to floor and managing clothes modified independent. (Target Date: 03/17/2015)   Time 1   Period Months   Status Achieved   PT SHORT TERM GOAL #3   Title Patient verbalizes proper cleaning & residual limb checking and demonstrates proper donning of prosthesis modified indepedent. (Target Date: 03/17/2015)   Time 1   Period Months   Status Achieved   PT SHORT TERM GOAL #4   Title Patient ambulates 300' with RW & prosthesis with cues only safely. (Target Date: 03/17/2015)   Time 1   Period Months   Status Achieved   PT SHORT TERM GOAL #5   Title Patient negotiates ramps & curbs with RW & prosthesis with cues only safely. (Target Date: 03/17/2015)   Time 1   Period Months   Status Achieved   PT SHORT TERM GOAL #6   Title Patient reports chronic pain in back & left knee increase < 2 increments from beginning of that session with above activities. (Target Date: 03/17/2015)   Time 1   Period Months   Status On-going           PT Long Term Goals - 03/31/15 1219    PT LONG TERM GOAL #1   Title Patient tolerates prosthesis wear >90% of awake hours without skin issues or undue tenderness of limb. (Target Date: 04/16/2015)   Time 2   Period Months   Status New   PT LONG TERM GOAL #2   Title Patient verbalizes proper prosthetic care including managing sweat & adjusting ply socks correctly.  (Target Date: 04/16/2015)   Time 2   Period Months   Status New   PT LONG TERM GOAL #3   Title Berg Balance >30/56  (Target Date: 04/16/2015)   Time 2   Period Months   Status New   PT LONG TERM GOAL #4   Title Patient ambulates at household level with cane or less & prosthesis carrying cup or plate  modified independent.  (Target Date: 04/16/2015)   Time 2   Period Months   Status New   PT LONG TERM GOAL #5   Title Patient ambulates 400' with LRAD & prosthesis for community moblity modified independent.  (Target Date: 04/16/2015)   Time 2   Period Months   Status New   PT LONG TERM GOAL #6   Title Patient negotiates stairs, ramps & curbs with LRAD & prosthesis for community mobility modified independent.  (Target Date: 04/16/2015)   Time 2   Period Months  Status New   PT LONG TERM GOAL #7   Title Patient reports chronic back & left knee pain increase </= 2 increments on 0-10 scale with above standing activiities.  (Target Date: 04/16/2015)   Time 2   Period Months   Status New         Plan - 03/31/15 1214    Clinical Impression Statement Pt. continues to progress towards her functional LTG's. Pt is gaining stability on level and unlevel surfaces with challenges. Pt continues to understand wear schedule and residual limb care.   Pt will benefit from skilled therapeutic intervention in order to improve on the following deficits Abnormal gait;Pain;Decreased endurance;Decreased strength;Decreased balance   Rehab Potential Good   PT Frequency 2x / week   PT Duration 8 weeks   PT Treatment/Interventions ADLs/Self Care Home Management;DME Instruction;Gait training;Stair training;Functional mobility training;Therapeutic exercise;Therapeutic activities;Balance training;Neuromuscular re-education;Patient/family education;Prosthetic Training   PT Next Visit Plan Pt supposed to bring dog for dog walking practice. Continue gait on multi surfaces with challenges to increase balance and righting ability.   Consulted and Agree with Plan of Care Patient            Problem List Patient Active Problem List   Diagnosis Date Noted  . Seizure ([place]) 11/13/2014  . Acute renal failure ([place]) 10/29/2014  . Cellulitis of foot 10/24/2014  . Hyponatremia 10/24/2014  . Hypokalemia 10/24/2014   . Diabetic infection of right foot ([place]) 10/24/2014  . DM (diabetes mellitus), type 2, uncontrolled ([place])   . Epilepsy ([place])   . COPD (chronic obstructive pulmonary disease) ([place])   . Bipolar 1 disorder ([place])   . OCD (obsessive compulsive disorder)   . Hypertension   . COLD (chronic obstructive lung disease) ([place])   . Essential hypertension   . Other epilepsy without status epilepticus, not intractable ([place])     Mallory Potts 04/01/2015, 3:23 PM  Mallory Potts, [place]  Name: Mallory Potts MRN: [id number] Date of Birth: [birth year]  This note has been reviewed and edited by supervising CI.  Willow Ora, PTA, [facility] [address], [place], [place] [address] [phone] 04/02/2015, 9:52 AM

## 2015-04-05 ENCOUNTER — Ambulatory Visit: Payer: Medicare Other | Admitting: Physical Therapy

## 2015-04-05 ENCOUNTER — Encounter: Payer: Self-pay | Admitting: Physical Therapy

## 2015-04-05 DIAGNOSIS — R6889 Other general symptoms and signs: Secondary | ICD-10-CM

## 2015-04-05 DIAGNOSIS — Z4789 Encounter for other orthopedic aftercare: Secondary | ICD-10-CM

## 2015-04-05 DIAGNOSIS — R269 Unspecified abnormalities of gait and mobility: Secondary | ICD-10-CM

## 2015-04-05 DIAGNOSIS — R2681 Unsteadiness on feet: Secondary | ICD-10-CM

## 2015-04-05 DIAGNOSIS — R2689 Other abnormalities of gait and mobility: Secondary | ICD-10-CM

## 2015-04-05 DIAGNOSIS — R531 Weakness: Secondary | ICD-10-CM

## 2015-04-05 DIAGNOSIS — Z89511 Acquired absence of right leg below knee: Secondary | ICD-10-CM

## 2015-04-05 NOTE — Therapy (Signed)
[facility] [address] [place], [place], [address] Phone: [phone]   Fax:  [phone]  Physical Therapy Treatment  Patient Details  Name: Mallory Potts MRN: [id number] Date of Birth: 1965/11/16 Referring Provider: [name], MD  Encounter Date: 04/05/2015      PT End of Session - 04/05/15 1156    Visit Number 14   Number of Visits 19   Date for PT Re-Evaluation 04/16/15   Authorization Type G-Code & progress report every 10th visit   PT Start Time 1100   PT Stop Time 1145   PT Time Calculation (min) 45 min   Equipment Utilized During Treatment Gait belt   Activity Tolerance Patient tolerated treatment well   Behavior During Therapy [facility] for tasks assessed/performed      Past Medical History  Diagnosis Date  . Diabetes mellitus   . Asthma   . Arthritis   . Hypertension   . Epilepsy ([place])   . Seizures ([place])   . Thyroid disease   . COPD (chronic obstructive pulmonary disease) ([place])   . Bipolar 1 disorder ([place])   . Peripheral edema   . OCD (obsessive compulsive disorder)   . COPD (chronic obstructive pulmonary disease) ([place])   . Depression   . Anxiety     Past Surgical History  Procedure Laterality Date  . Tonsillectomy    . Knee arthroscopy    . Amputation Right 10/24/2014    Procedure: AMPUTATION RAY RIGHT SECOND TOE;  Surgeon: Netta Cedars, MD;  Location: [facility];  Service: Orthopedics;  Laterality: Right;  . Amputation Right 10/27/2014    Procedure: RIGHT BELOW KNEE AMPUTATION;  Surgeon: [name], MD;  Location[facility];  Service: Orthopedics;  Laterality: Right;    There were no vitals filed for this visit.  Visit Diagnosis:  Abnormality of gait  Balance problems  Unsteadiness  Encounter for prosthetic gait training  Decreased functional activity tolerance  Weakness generalized  Status post below knee amputation of right lower extremity (HCC)      Subjective Assessment - 04/05/15  1112    Subjective Pt reports 6/10 pain at beginning of treatment and 8/10 at end. Pt stepped in shallow hole in grass and Pain increased.   Patient Stated Goals to be able to walk in home & community, possibly dance. "I want to walk my dog down the street.   Currently in Pain? Yes   Pain Score 8    Pain Location Knee   Pain Orientation Left   Pain Descriptors / Indicators Aching;Sore   Pain Type Chronic pain   Pain Onset More than a month ago   Pain Frequency Constant   Aggravating Factors  weight bearing   Pain Relieving Factors rest and removing prosthesis   Effect of Pain on Daily Activities limits time in weight bearing activity.   Multiple Pain Sites Yes   Pain Score 5   Pain Location Knee   Pain Orientation Right   Pain Onset More than a month ago   Aggravating Factors  Pt presented with pain in R knee in Single leg stance on AirX.   Pain Relieving Factors seated rest break     NMR: To increase balance and single leg stance control during gait and ADL's. Pt min guard for safety with balance challenges, with verbal and tactile cues for righting balance.  1. Double leg stance on rocker board in parallel bars with practice in both directions. 4 sets with front to back  each rep 30 sec, 2 sets eyes open and 2 sets eyes closed. 3 sets side to side, each rep 30 sec, 1 set eyes open and 2 sets eyes closed.   2. Alternating single leg stance on Air X with cone tip over and recovers. Pt min assist with pain management with verbal cues to stop when pain begins to increase.   3. Pt completed 565ft of ambulation on outdoor unlevel surfaces (pavement, grass, rubber mulch) with straight cane used. Pt min guard assist with verbal and tactile cues for body posture and environment awareness (scan ahead for barriers, obstacles).       [facility] Adult PT Treatment/Exercise - 04/05/15 0001    Prosthetics   Prosthetic Care Comments  skin intact   Current prosthetic wear tolerance (days/week)  daily    Current prosthetic wear tolerance (#hours/day)  all awake hours   Residual limb condition  WNL   Donning Prosthesis Modified independent (device/increased time)   Doffing Prosthesis Modified independent (device/increased time)           PT Short Term Goals - 04/05/15 1204    PT SHORT TERM GOAL #1   Title Patient tolerates wear of prosthesis >10hrs total /day without skin issues or residual limb discomfort. (Target Date: 03/17/2015)   Baseline achieved on 03/22/15   Time 1   Period Months   Status Achieved   PT SHORT TERM GOAL #2   Title Patient performs standing balance activities with RW & prosthesis reaching 10" all directions, to floor and managing clothes modified independent. (Target Date: 03/17/2015)   Time 1   Period Months   Status Achieved   PT SHORT TERM GOAL #3   Title Patient verbalizes proper cleaning & residual limb checking and demonstrates proper donning of prosthesis modified indepedent. (Target Date: 03/17/2015)   Time 1   Period Months   Status Achieved   PT SHORT TERM GOAL #4   Title Patient ambulates 300' with RW & prosthesis with cues only safely. (Target Date: 03/17/2015)   Time 1   Period Months   Status Achieved   PT SHORT TERM GOAL #5   Title Patient negotiates ramps & curbs with RW & prosthesis with cues only safely. (Target Date: 03/17/2015)   Time 1   Period Months   Status Achieved   PT SHORT TERM GOAL #6   Title Patient reports chronic pain in back & left knee increase < 2 increments from beginning of that session with above activities. (Target Date: 03/17/2015)   Time 1   Period Months   Status On-going           PT Long Term Goals - 04/05/15 1204    PT LONG TERM GOAL #1   Title Patient tolerates prosthesis wear >90% of awake hours without skin issues or undue tenderness of limb. (Target Date: 04/16/2015)   Time 2   Period Months   Status New   PT LONG TERM GOAL #2   Title Patient verbalizes proper prosthetic care including  managing sweat & adjusting ply socks correctly.  (Target Date: 04/16/2015)   Time 2   Period Months   Status New   PT LONG TERM GOAL #3   Title Berg Balance >30/56  (Target Date: 04/16/2015)   Time 2   Period Months   Status New   PT LONG TERM GOAL #4   Title Patient ambulates at household level with cane or less & prosthesis carrying cup or plate modified independent.  (Target Date:  04/16/2015)   Time 2   Period Months   Status New   PT LONG TERM GOAL #5   Title Patient ambulates 400' with LRAD & prosthesis for community moblity modified independent.  (Target Date: 04/16/2015)   Time 2   Period Months   Status New   PT LONG TERM GOAL #6   Title Patient negotiates stairs, ramps & curbs with LRAD & prosthesis for community mobility modified independent.  (Target Date: 04/16/2015)   Time 2   Period Months   Status New   PT LONG TERM GOAL #7   Title Patient reports chronic back & left knee pain increase </= 2 increments on 0-10 scale with above standing activiities.  (Target Date: 04/16/2015)   Time 2   Period Months   Status New            Plan - 04/05/15 1157    Clinical Impression Statement Pt continues to progress towards LTGs. Pt did receive adjustment by prosthetist to R prothesis and gait presented without hip drop on right. Pt is unsteady on unlevel surfaces. Patient presented with increased pain in R knee during single leg stance activities and increased pain in L knee with outdoor, unlevel surfaces. Pt requires cues for stopping activity with increased pain.   Pt will benefit from skilled therapeutic intervention in order to improve on the following deficits Abnormal gait;Pain;Decreased endurance;Decreased strength;Decreased balance   Rehab Potential Good   PT Frequency 2x / week   PT Duration 8 weeks   PT Treatment/Interventions ADLs/Self Care Home Management;DME Instruction;Gait training;Stair training;Functional mobility training;Therapeutic exercise;Therapeutic  activities;Balance training;Neuromuscular re-education;Patient/family education;Prosthetic Training   PT Next Visit Plan Pt to bring dog for dog walking training next visit.  Continue to work on dynamic and static balance and single leg stance (monitor for increased pain with single leg stance.   Consulted and Agree with Plan of Care Patient        Problem List Patient Active Problem List   Diagnosis Date Noted  . Seizure ([place]) 11/13/2014  . Acute renal failure ([place]) 10/29/2014  . Cellulitis of foot 10/24/2014  . Hyponatremia 10/24/2014  . Hypokalemia 10/24/2014  . Diabetic infection of right foot ([place]) 10/24/2014  . DM (diabetes mellitus), type 2, uncontrolled ([place])   . Epilepsy ([place])   . COPD (chronic obstructive pulmonary disease) ([place])   . Bipolar 1 disorder ([place])   . OCD (obsessive compulsive disorder)   . Hypertension   . COLD (chronic obstructive lung disease) ([place])   . Essential hypertension   . Other epilepsy without status epilepticus, not intractable ([place])     Mallory Potts 04/05/2015, 12:33 PM  Mallory Potts, [place]  Name: Mallory Potts MRN: [id number] Date of Birth: 1965-08-19   This note has been reviewed and edited by supervising CI.  Willow Ora, PTA, [facility] [address] [place], [place] [address] [phone] 04/05/2015, 3:25 PM

## 2015-04-07 ENCOUNTER — Ambulatory Visit: Payer: Medicare Other | Admitting: Physical Therapy

## 2015-04-07 ENCOUNTER — Encounter: Payer: Self-pay | Admitting: Physical Therapy

## 2015-04-07 DIAGNOSIS — Z89511 Acquired absence of right leg below knee: Secondary | ICD-10-CM

## 2015-04-07 DIAGNOSIS — R2689 Other abnormalities of gait and mobility: Secondary | ICD-10-CM

## 2015-04-07 DIAGNOSIS — R269 Unspecified abnormalities of gait and mobility: Secondary | ICD-10-CM

## 2015-04-07 DIAGNOSIS — Z4789 Encounter for other orthopedic aftercare: Secondary | ICD-10-CM

## 2015-04-07 DIAGNOSIS — R6889 Other general symptoms and signs: Secondary | ICD-10-CM

## 2015-04-07 DIAGNOSIS — R531 Weakness: Secondary | ICD-10-CM

## 2015-04-07 DIAGNOSIS — R2681 Unsteadiness on feet: Secondary | ICD-10-CM

## 2015-04-07 NOTE — Therapy (Signed)
[facility] [address] [place], [place], [address] Phone: [phone]   Fax:  [phone]  Physical Therapy Treatment  Patient Details  Name: Mallory Potts MRN: [id number] Date of Birth: 1966-04-13 Referring Provider: [name], MD  Encounter Date: 04/07/2015      PT End of Session - 04/07/15 1154    Visit Number 15   Number of Visits 19   Date for PT Re-Evaluation 04/16/15   Authorization Type G-Code & progress report every 10th visit   PT Start Time 1100   PT Stop Time 1145   PT Time Calculation (min) 45 min   Equipment Utilized During Treatment Gait belt   Activity Tolerance Patient tolerated treatment well   Behavior During Therapy [facility] for tasks assessed/performed      Past Medical History  Diagnosis Date  . Diabetes mellitus   . Asthma   . Arthritis   . Hypertension   . Epilepsy ([place])   . Seizures ([place])   . Thyroid disease   . COPD (chronic obstructive pulmonary disease) ([place])   . Bipolar 1 disorder ([place])   . Peripheral edema   . OCD (obsessive compulsive disorder)   . COPD (chronic obstructive pulmonary disease) ([place])   . Depression   . Anxiety     Past Surgical History  Procedure Laterality Date  . Tonsillectomy    . Knee arthroscopy    . Amputation Right 10/24/2014    Procedure: AMPUTATION RAY RIGHT SECOND TOE;  Surgeon: Netta Cedars, MD;  Location: [facility];  Service: Orthopedics;  Laterality: Right;  . Amputation Right 10/27/2014    Procedure: RIGHT BELOW KNEE AMPUTATION;  Surgeon: [name], MD;  Location: [facility];  Service: Orthopedics;  Laterality: Right;    There were no vitals filed for this visit.  Visit Diagnosis:  Abnormality of gait  Balance problems  Unsteadiness  Encounter for prosthetic gait training  Decreased functional activity tolerance  Weakness generalized  Status post below knee amputation of right lower extremity (HCC)      Subjective Assessment - 04/07/15  1118    Subjective Pt reports 4/10 pain. "My pain is good today, that why I walked in and didn't use my wheel chair." Pt inquired about strengthening exercise for L knee.   Patient Stated Goals to be able to walk in home & community, possibly dance. "I want to walk my dog down the street.   Currently in Pain? Yes   Pain Score 4    Pain Location Knee   Pain Orientation Left   Pain Descriptors / Indicators Aching;Sore   Pain Type Chronic pain   Pain Onset More than a month ago   Pain Frequency Constant   Aggravating Factors  weight bearing   Pain Relieving Factors rest and removing prosthesis   Effect of Pain on Daily Activities limits time in weight bearing activity   Multiple Pain Sites Yes   Pain Score 4   Pain Location Hip   Pain Orientation Left   Pain Onset More than a month ago   Pain Frequency Rarely   Aggravating Factors  weight bearing   Pain Relieving Factors rest     Prosthetic training: To increase pt's functional independence with R prothesis and single tip cane.  1. 512ft of ambulation on outdoor unlevel surfaces with dog leash challenges. 226ft without challenges and 289ft with dog leash challenge. Pt presents steady on unlevel surfaces with supervision assist without challenges and min guard assist with challenges.  Verbal and tactile cues for weight shifting to resist pull from dog leash. 2. Stairs: 4 with handrail on left and 4 with handrail on right, 1 rep each side. Pt supervision assist without verbal cues.  NMR: To encourage equal weight bearing on B LE's and to reduce her risk of falls with various functional challenges. 1. Double leg stance on BOSU ball in parallel bars: 1 min, eyes closed, and 1 min, eyes open. Min guard assist with verbal and tactile cues for righting balance. 2. Narrow BOS in parallel bars with eyes closed: 1 min, eyes closed. 3. 269ft of ambulation on indoor, level surfaces with eyes closed to challenge vestibular system. Pt min guard assist  with verbal and tactile cues for directions. Pt's gait speed only decreased slightly. There was some veering, but overall        pt remained steady during the exercise. 4. Turning in response to stimulus during gait on level surfaces: Pt walked unitl SPTA gave the cue to turn. First 5 reps pt turned to look back with staggered stance without turning feet. Second 5 reps pt turned 180 degrees to face in the     opposite direction. Pt min guard assist for safety. Pt without loss of balance, cues for weight shifting and stances stability after turning.       PT Education - 04/07/15 1152    Education provided Yes   Education Details Pt educated on donning prosthesis with long pants on. (To put prosthesis in pants before donning prosthesis). Pt educated on rationale of closing eyes during balance training.   Person(s) Educated Patient   Methods Explanation   Comprehension Verbalized understanding          PT Short Term Goals - 04/07/15 1202    PT SHORT TERM GOAL #1   Title Patient tolerates wear of prosthesis >10hrs total /day without skin issues or residual limb discomfort. (Target Date: 03/17/2015)   Baseline achieved on 03/22/15   Time 1   Period Months   Status Achieved   PT SHORT TERM GOAL #2   Title Patient performs standing balance activities with RW & prosthesis reaching 10" all directions, to floor and managing clothes modified independent. (Target Date: 03/17/2015)   Time 1   Period Months   Status Achieved   PT SHORT TERM GOAL #3   Title Patient verbalizes proper cleaning & residual limb checking and demonstrates proper donning of prosthesis modified indepedent. (Target Date: 03/17/2015)   Time 1   Period Months   Status Achieved   PT SHORT TERM GOAL #4   Title Patient ambulates 300' with RW & prosthesis with cues only safely. (Target Date: 03/17/2015)   Time 1   Period Months   Status Achieved   PT SHORT TERM GOAL #5   Title Patient negotiates ramps & curbs with RW &  prosthesis with cues only safely. (Target Date: 03/17/2015)   Time 1   Period Months   Status Achieved   PT SHORT TERM GOAL #6   Title Patient reports chronic pain in back & left knee increase < 2 increments from beginning of that session with above activities. (Target Date: 03/17/2015)   Time 1   Period Months   Status On-going           PT Long Term Goals - 04/07/15 1202    PT LONG TERM GOAL #1   Title Patient tolerates prosthesis wear >90% of awake hours without skin issues or undue tenderness of limb. (Target  Date: 04/16/2015)   Time 2   Period Months   Status New   PT LONG TERM GOAL #2   Title Patient verbalizes proper prosthetic care including managing sweat & adjusting ply socks correctly.  (Target Date: 04/16/2015)   Time 2   Period Months   Status New   PT LONG TERM GOAL #3   Title Berg Balance >30/56  (Target Date: 04/16/2015)   Time 2   Period Months   Status New   PT LONG TERM GOAL #4   Title Patient ambulates at household level with cane or less & prosthesis carrying cup or plate modified independent.  (Target Date: 04/16/2015)   Time 2   Period Months   Status New   PT LONG TERM GOAL #5   Title Patient ambulates 400' with LRAD & prosthesis for community moblity modified independent.  (Target Date: 04/16/2015)   Time 2   Period Months   Status New   PT LONG TERM GOAL #6   Title Patient negotiates stairs, ramps & curbs with LRAD & prosthesis for community mobility modified independent.  (Target Date: 04/16/2015)   Time 2   Period Months   Status New   PT LONG TERM GOAL #7   Title Patient reports chronic back & left knee pain increase </= 2 increments on 0-10 scale with above standing activiities.  (Target Date: 04/16/2015)   Time 2   Period Months   Status New           Plan - 04/07/15 1155    Clinical Impression Statement Pt progressing towards LTG's. Pt supervision on outdoor unlevel surfaces without balance challenges. Pt progressing in balance  exercises. Pt able to turn and react to stimulus while walking without LOB. Pt with 3 seated rest breaks during treatment for pain management. Pain increased 2 levels during treatment from 4/10 to 6/10. Pt is becoming more conscious of pain and the need for rest breaks to control inflammation.   Pt will benefit from skilled therapeutic intervention in order to improve on the following deficits Abnormal gait;Pain;Decreased endurance;Decreased strength;Decreased balance   Rehab Potential Good   PT Frequency 2x / week   PT Duration 8 weeks   PT Treatment/Interventions ADLs/Self Care Home Management;DME Instruction;Gait training;Stair training;Functional mobility training;Therapeutic exercise;Therapeutic activities;Balance training;Neuromuscular re-education;Patient/family education;Prosthetic Training   PT Next Visit Plan Work on Designer, television/film set, static and dynamic balalnce, dodging obastacles and quick changes in direction.   Consulted and Agree with Plan of Care Patient        Problem List Patient Active Problem List   Diagnosis Date Noted  . Seizure ([place]) 11/13/2014  . Acute renal failure ([place]) 10/29/2014  . Cellulitis of foot 10/24/2014  . Hyponatremia 10/24/2014  . Hypokalemia 10/24/2014  . Diabetic infection of right foot ([place]) 10/24/2014  . DM (diabetes mellitus), type 2, uncontrolled ([place])   . Epilepsy ([place])   . COPD (chronic obstructive pulmonary disease) ([place])   . Bipolar 1 disorder ([place])   . OCD (obsessive compulsive disorder)   . Hypertension   . COLD (chronic obstructive lung disease) ([place])   . Essential hypertension   . Other epilepsy without status epilepticus, not intractable ([place])     Laney Potash 04/07/2015, 12:04 PM  Laney Potash, [place]  Name: Mallory Potts MRN: [id number] Date of Birth: [birth year]  This note has been reviewed and edited by supervising CI.  Willow Ora, PTA, [facility] [address] [place], [place]  [phone]  04/08/2015, 3:25 PM

## 2015-04-12 ENCOUNTER — Encounter: Payer: No Typology Code available for payment source | Admitting: Physical Therapy

## 2015-04-13 ENCOUNTER — Ambulatory Visit: Payer: Medicare Other | Admitting: Physical Therapy

## 2015-04-13 ENCOUNTER — Encounter: Payer: Self-pay | Admitting: Physical Therapy

## 2015-04-13 DIAGNOSIS — R269 Unspecified abnormalities of gait and mobility: Secondary | ICD-10-CM | POA: Diagnosis not present

## 2015-04-13 DIAGNOSIS — Z89511 Acquired absence of right leg below knee: Secondary | ICD-10-CM

## 2015-04-13 DIAGNOSIS — Z4789 Encounter for other orthopedic aftercare: Secondary | ICD-10-CM

## 2015-04-13 DIAGNOSIS — R6889 Other general symptoms and signs: Secondary | ICD-10-CM

## 2015-04-13 DIAGNOSIS — R2689 Other abnormalities of gait and mobility: Secondary | ICD-10-CM

## 2015-04-13 DIAGNOSIS — R531 Weakness: Secondary | ICD-10-CM

## 2015-04-13 DIAGNOSIS — R2681 Unsteadiness on feet: Secondary | ICD-10-CM

## 2015-04-13 NOTE — Therapy (Signed)
[facility] [address] [place], [place], [address] Phone: [phone]   Fax:  [phone]  Physical Therapy Treatment  Patient Details  Name: Mallory Potts MRN: [id number] Date of Birth: 1965/08/10 Referring Provider: [name], MD  Encounter Date: 04/13/2015      PT End of Session - 04/13/15 0845    Visit Number 16   Number of Visits 19   Date for PT Re-Evaluation 04/16/15   Authorization Type G-Code & progress report every 10th visit   PT Start Time 0801   PT Stop Time 0845   PT Time Calculation (min) 44 min   Activity Tolerance Patient tolerated treatment well   Behavior During Therapy [facility] for tasks assessed/performed      Past Medical History  Diagnosis Date  . Diabetes mellitus   . Asthma   . Arthritis   . Hypertension   . Epilepsy ([place])   . Seizures ([place])   . Thyroid disease   . COPD (chronic obstructive pulmonary disease) ([place])   . Bipolar 1 disorder ([place])   . Peripheral edema   . OCD (obsessive compulsive disorder)   . COPD (chronic obstructive pulmonary disease) ([place])   . Depression   . Anxiety     Past Surgical History  Procedure Laterality Date  . Tonsillectomy    . Knee arthroscopy    . Amputation Right 10/24/2014    Procedure: AMPUTATION RAY RIGHT SECOND TOE;  Surgeon: Netta Cedars, MD;  Location: [facility];  Service: Orthopedics;  Laterality: Right;  . Amputation Right 10/27/2014    Procedure: RIGHT BELOW KNEE AMPUTATION;  Surgeon: [name], MD;  Location: [facility];  Service: Orthopedics;  Laterality: Right;    There were no vitals filed for this visit.  Visit Diagnosis:  Abnormality of gait  Balance problems  Unsteadiness  Encounter for prosthetic gait training  Decreased functional activity tolerance  Weakness generalized  Status post below knee amputation of right lower extremity (HCC)      Subjective Assessment - 04/13/15 0804    Subjective She had an issue with  dizziness that was related to her medications. So she was able to correct with taking her medication.    Currently in Pain? Yes   Pain Score 5    Pain Location Knee   Pain Orientation Left   Pain Descriptors / Indicators Aching;Sore   Pain Type Chronic pain   Pain Onset More than a month ago   Pain Frequency Constant   Aggravating Factors  weather, overdoing activities   Pain Relieving Factors rest and removing prosthesis    Multiple Pain Sites Yes   Pain Score 4   Pain Location Back   Pain Orientation Lower   Pain Descriptors / Indicators Aching   Pain Type Chronic pain   Pain Onset More than a month ago   Pain Frequency Constant   Aggravating Factors  overdoing activities and stiffness with staying in same position too long   Pain Relieving Factors rest after activities and moving if stiff             Prosthetics Assessment - 04/13/15 0845    Prosthetics   Prosthetic Care Independent with Skin check;Residual limb care;Prosthetic cleaning;Care of non-amputated limb;Ply sock cleaning;Correct ply sock adjustment;Proper wear schedule/adjustment;Proper weight-bearing schedule/adjustment                    [facility] Adult PT Treatment/Exercise - 04/13/15 0845    Transfers   Sit to Stand  6: Modified independent (Device/Increase time)   Stand to Sit 6: Modified independent (Device/Increase time)   Ambulation/Gait   Ambulation/Gait Yes   Ambulation/Gait Assistance 6: Modified independent (Device/Increase time)   Ambulation/Gait Assistance Details household without device carrying cup of water around furniture and community mobility with single point cane    Ambulation Distance (Feet) 500 Feet  500' with cane, 110' without device   Assistive device Prosthesis;None;Straight cane  Rollator was used for outside ambulation.   Gait Pattern Step-through pattern   Ambulation Surface Indoor;Level   Stairs Yes   Stairs Assistance 6: Modified independent (Device/Increase time)    Stair Management Technique One rail Left;With cane   Number of Stairs 8   Ramp 6: Modified independent (Device)  single point cane & prosthesis   Curb 6: Modified independent (Device/increase time)  single point cane & prosthesis   Prosthetics   Prosthetic Care Comments  PT instructed with demo on patient use of cut-off socks and changing shoes. Patient verbalized understanding.    Current prosthetic wear tolerance (days/week)  daily   Current prosthetic wear tolerance (#hours/day)  >90% of awake hours   Residual limb condition  normal WNL   Education Provided --   Donning Prosthesis Modified independent (device/increased time)   Doffing Prosthesis Modified independent (device/increased time)                  PT Short Term Goals - 04/07/15 1202    PT SHORT TERM GOAL #1   Title Patient tolerates wear of prosthesis >10hrs total /day without skin issues or residual limb discomfort. (Target Date: 03/17/2015)   Baseline achieved on 03/22/15   Time 1   Period Months   Status Achieved   PT SHORT TERM GOAL #2   Title Patient performs standing balance activities with RW & prosthesis reaching 10" all directions, to floor and managing clothes modified independent. (Target Date: 03/17/2015)   Time 1   Period Months   Status Achieved   PT SHORT TERM GOAL #3   Title Patient verbalizes proper cleaning & residual limb checking and demonstrates proper donning of prosthesis modified indepedent. (Target Date: 03/17/2015)   Time 1   Period Months   Status Achieved   PT SHORT TERM GOAL #4   Title Patient ambulates 300' with RW & prosthesis with cues only safely. (Target Date: 03/17/2015)   Time 1   Period Months   Status Achieved   PT SHORT TERM GOAL #5   Title Patient negotiates ramps & curbs with RW & prosthesis with cues only safely. (Target Date: 03/17/2015)   Time 1   Period Months   Status Achieved   PT SHORT TERM GOAL #6   Title Patient reports chronic pain in back & left  knee increase < 2 increments from beginning of that session with above activities. (Target Date: 03/17/2015)   Time 1   Period Months   Status On-going           PT Long Term Goals - 04/13/15 0845    PT LONG TERM GOAL #1   Title Patient tolerates prosthesis wear >90% of awake hours without skin issues or undue tenderness of limb. (Target Date: 04/16/2015)   Baseline MET 04/13/2015   Time 2   Period Months   Status Achieved   PT LONG TERM GOAL #2   Title Patient verbalizes proper prosthetic care including managing sweat & adjusting ply socks correctly.  (Target Date: 04/16/2015)   Baseline MET 04/13/2015   Time  2   Period Months   Status Achieved   PT LONG TERM GOAL #3   Title Berg Balance >30/56  (Target Date: 04/16/2015)   Time 2   Period Months   Status On-going   PT LONG TERM GOAL #4   Title Patient ambulates at household level with cane or less & prosthesis carrying cup or plate modified independent.  (Target Date: 04/16/2015)   Baseline MET 04/13/2015 with only prosthesis   Time 2   Period Months   Status Achieved   PT LONG TERM GOAL #5   Title Patient ambulates 400' with LRAD & prosthesis for community moblity modified independent.  (Target Date: 04/16/2015)   Baseline MET 04/13/2015 with single point cane & prosthesis   Time 2   Period Months   Status Achieved   PT LONG TERM GOAL #6   Title Patient negotiates stairs, ramps & curbs with LRAD & prosthesis for community mobility modified independent.  (Target Date: 04/16/2015)   Baseline MET 04/13/2015 with single point cane on ramps & curbs and stairs with 1 rail & cane   Time 2   Period Months   Status Achieved   PT LONG TERM GOAL #7   Title Patient reports chronic back & left knee pain increase </= 2 increments on 0-10 scale with above standing activiities.  (Target Date: 04/16/2015)   Time 2   Period Months   Status On-going               Plan - 04/13/15 0845    Clinical Impression Statement  Patient met 5 of 7 LTGs and anticipate meeting other 2 at next session. Patient reports no change in knee or back pain with today's activities. Patient seems to understand updated prosthetic care instructions.    Pt will benefit from skilled therapeutic intervention in order to improve on the following deficits Abnormal gait;Pain;Decreased endurance;Decreased strength;Decreased balance   Rehab Potential Good   PT Frequency 2x / week   PT Duration 8 weeks   PT Treatment/Interventions ADLs/Self Care Home Management;DME Instruction;Gait training;Stair training;Functional mobility training;Therapeutic exercise;Therapeutic activities;Balance training;Neuromuscular re-education;Patient/family education;Prosthetic Training   PT Next Visit Plan Assess remaining 2 LTGs (Berg Balance & pain), Work on Designer, television/film set,    Consulted and Agree with Plan of Care Patient        Problem List Patient Active Problem List   Diagnosis Date Noted  . Seizure ([place]) 11/13/2014  . Acute renal failure ([place]) 10/29/2014  . Cellulitis of foot 10/24/2014  . Hyponatremia 10/24/2014  . Hypokalemia 10/24/2014  . Diabetic infection of right foot ([place]) 10/24/2014  . DM (diabetes mellitus), type 2, uncontrolled ([place])   . Epilepsy ([place])   . COPD (chronic obstructive pulmonary disease) ([place])   . Bipolar 1 disorder ([place])   . OCD (obsessive compulsive disorder)   . Hypertension   . COLD (chronic obstructive lung disease) ([place])   . Essential hypertension   . Other epilepsy without status epilepticus, not intractable ([place])     Derrion Tritz PT, DPT 04/13/2015, 11:16 AM  [facility] [address] [place], [place], [address] Phone: [phone]   Fax:  [phone]  Name: Mallory Potts MRN: [id number] Date of Birth: [birth year]

## 2015-04-14 ENCOUNTER — Ambulatory Visit: Payer: Medicare Other | Admitting: Physical Therapy

## 2015-04-14 ENCOUNTER — Encounter: Payer: Self-pay | Admitting: Physical Therapy

## 2015-04-14 ENCOUNTER — Encounter: Payer: No Typology Code available for payment source | Admitting: Physical Therapy

## 2015-04-14 DIAGNOSIS — R269 Unspecified abnormalities of gait and mobility: Secondary | ICD-10-CM | POA: Diagnosis not present

## 2015-04-14 DIAGNOSIS — R531 Weakness: Secondary | ICD-10-CM

## 2015-04-14 DIAGNOSIS — Z89511 Acquired absence of right leg below knee: Secondary | ICD-10-CM

## 2015-04-14 DIAGNOSIS — R2689 Other abnormalities of gait and mobility: Secondary | ICD-10-CM

## 2015-04-14 DIAGNOSIS — Z4789 Encounter for other orthopedic aftercare: Secondary | ICD-10-CM

## 2015-04-14 NOTE — Therapy (Signed)
[facility] [address] [place], [place], [address] Phone: [phone]   Fax:  [phone]  Physical Therapy Treatment  Patient Details  Name: Mallory Potts MRN: [id number] Date of Birth: [birth year] Referring Provider: [name], MD  Encounter Date: 04/14/2015 PHYSICAL THERAPY DISCHARGE SUMMARY  Visits from Start of Care: 17  Current functional level related to goals / functional outcomes: See below   Remaining deficits: See below   Education / Equipment: Prosthetic care, monitoring activities & varying assistive devices to help manage chronic knee & back pain,  Plan: Patient agrees to discharge.  Patient goals were met. Patient is being discharged due to meeting the stated rehab goals.  ?????            PT End of Session - 04/14/15 0845    Visit Number 17   Number of Visits 19   Date for PT Re-Evaluation 04/16/15   Authorization Type G-Code & progress report every 10th visit   PT Start Time 0804   PT Stop Time 0844   PT Time Calculation (min) 40 min   Activity Tolerance Patient tolerated treatment well   Behavior During Therapy [facility] for tasks assessed/performed      Past Medical History  Diagnosis Date  . Diabetes mellitus   . Asthma   . Arthritis   . Hypertension   . Epilepsy ([place])   . Seizures ([place])   . Thyroid disease   . COPD (chronic obstructive pulmonary disease) ([place])   . Bipolar 1 disorder ([place])   . Peripheral edema   . OCD (obsessive compulsive disorder)   . COPD (chronic obstructive pulmonary disease) ([place])   . Depression   . Anxiety     Past Surgical History  Procedure Laterality Date  . Tonsillectomy    . Knee arthroscopy    . Amputation Right 10/24/2014    Procedure: AMPUTATION RAY RIGHT SECOND TOE;  Surgeon: Netta Cedars, MD;  Location: [facility];  Service: Orthopedics;  Laterality: Right;  . Amputation Right 10/27/2014    Procedure: RIGHT BELOW KNEE AMPUTATION;  Surgeon: [name], MD;  Location: [facility];  Service: Orthopedics;  Laterality: Right;    There were no vitals filed for this visit.  Visit Diagnosis:  Abnormality of gait  Balance problems  Encounter for prosthetic gait training  Status post below knee amputation of right lower extremity (HCC)  Weakness generalized      Subjective Assessment - 04/14/15 0809    Subjective No issues.  She was busy yesterday preparing for Thanksgiving but did take breaks as PT suggested.    Currently in Pain? Yes   Pain Score 6    Pain Location Knee   Pain Orientation Left   Pain Descriptors / Indicators Aching;Sore   Pain Type Chronic pain   Pain Onset More than a month ago   Pain Frequency Constant   Pain Score 3   Pain Location Back   Pain Orientation Lower   Pain Descriptors / Indicators Aching   Pain Type Chronic pain   Pain Onset More than a month ago   Pain Frequency Constant            [facility] PT Assessment - 04/14/15 0805    Berg Balance Test   Sit to Stand Able to stand without using hands and stabilize independently   Standing Unsupported Able to stand safely 2 minutes   Sitting with Back Unsupported but Feet Supported on Floor or Stool Able to sit  safely and securely 2 minutes   Stand to Sit Sits safely with minimal use of hands   Transfers Able to transfer safely, minor use of hands   Standing Unsupported with Eyes Closed Able to stand 10 seconds safely   Standing Ubsupported with Feet Together Able to place feet together independently and stand 1 minute safely   From Standing, Reach Forward with Outstretched Arm Can reach confidently >25 cm (10")   From Standing Position, Pick up Object from Floor Able to pick up shoe safely and easily   From Standing Position, Turn to Look Behind Over each Shoulder Looks behind from both sides and weight shifts well   Turn 360 Degrees Able to turn 360 degrees safely in 4 seconds or less   Standing Unsupported, Alternately Place Feet on Step/Stool  Able to stand independently and safely and complete 8 steps in 20 seconds   Standing Unsupported, One Foot in Front Able to take small step independently and hold 30 seconds   Standing on One Leg Tries to lift leg/unable to hold 3 seconds but remains standing independently   Total Score 51         Prosthetics Assessment - 04/13/15 0845    Prosthetics   Prosthetic Care Independent with Skin check;Residual limb care;Prosthetic cleaning;Care of non-amputated limb;Ply sock cleaning;Correct ply sock adjustment;Proper wear schedule/adjustment;Proper weight-bearing schedule/adjustment     Prosthetic Training:  Patient ambulated 500' on grass & paved surfaces with simulated walking her dog with occasional "pull" modified independent. PT recommended initially using a Gentle Lead and having 2nd person with 2nd longer leash as back up. Patient verbalized understanding.                [facility] Adult PT Treatment/Exercise - 04/13/15 0845    Transfers   Sit to Stand 6: Modified independent (Device/Increase time)   Stand to Sit 6: Modified independent (Device/Increase time)   Ambulation/Gait   Ambulation/Gait Yes   Ambulation/Gait Assistance 6: Modified independent (Device/Increase time)   Ambulation/Gait Assistance Details household without device carrying cup of water around furniture and community mobility with single point cane    Ambulation Distance (Feet) 500 Feet  500' with cane, 110' without device   Assistive device Prosthesis;None;Straight cane  Rollator was used for outside ambulation.   Gait Pattern Step-through pattern   Ambulation Surface Indoor;Level   Stairs Yes   Stairs Assistance 6: Modified independent (Device/Increase time)   Stair Management Technique One rail Left;With cane   Number of Stairs 8   Ramp 6: Modified independent (Device)  single point cane & prosthesis   Curb 6: Modified independent (Device/increase time)  single point cane & prosthesis   Prosthetics    Prosthetic Care Comments  PT instructed with demo on patient use of cut-off socks and changing shoes. Patient verbalized understanding.    Current prosthetic wear tolerance (days/week)  daily   Current prosthetic wear tolerance (#hours/day)  >90% of awake hours   Residual limb condition  normal WNL   Education Provided --   Donning Prosthesis Modified independent (device/increased time)   Doffing Prosthesis Modified independent (device/increased time)                PT Education - 04/14/15 1341    Education provided Yes   Education Details Ongoing care / follow-up with prosthetist. Using rest periods and varying assistive device to manage chronic pain issues.    Person(s) Educated Patient   Methods Explanation   Comprehension Verbalized understanding  PT Short Term Goals - 04/07/15 1202    PT SHORT TERM GOAL #1   Title Patient tolerates wear of prosthesis >10hrs total /day without skin issues or residual limb discomfort. (Target Date: 03/17/2015)   Baseline achieved on 03/22/15   Time 1   Period Months   Status Achieved   PT SHORT TERM GOAL #2   Title Patient performs standing balance activities with RW & prosthesis reaching 10" all directions, to floor and managing clothes modified independent. (Target Date: 03/17/2015)   Time 1   Period Months   Status Achieved   PT SHORT TERM GOAL #3   Title Patient verbalizes proper cleaning & residual limb checking and demonstrates proper donning of prosthesis modified indepedent. (Target Date: 03/17/2015)   Time 1   Period Months   Status Achieved   PT SHORT TERM GOAL #4   Title Patient ambulates 300' with RW & prosthesis with cues only safely. (Target Date: 03/17/2015)   Time 1   Period Months   Status Achieved   PT SHORT TERM GOAL #5   Title Patient negotiates ramps & curbs with RW & prosthesis with cues only safely. (Target Date: 03/17/2015)   Time 1   Period Months   Status Achieved   PT SHORT TERM GOAL #6    Title Patient reports chronic pain in back & left knee increase < 2 increments from beginning of that session with above activities. (Target Date: 03/17/2015)   Time 1   Period Months   Status On-going           PT Long Term Goals - 04/14/15 0845    PT LONG TERM GOAL #1   Title Patient tolerates prosthesis wear >90% of awake hours without skin issues or undue tenderness of limb. (Target Date: 04/16/2015)   Baseline MET 04/13/2015   Time 2   Period Months   Status Achieved   PT LONG TERM GOAL #2   Title Patient verbalizes proper prosthetic care including managing sweat & adjusting ply socks correctly.  (Target Date: 04/16/2015)   Baseline MET 04/13/2015   Time 2   Period Months   Status Achieved   PT LONG TERM GOAL #3   Title Berg Balance >30/56  (Target Date: 04/16/2015)   Baseline MET 04/14/2015  Merrilee Jansky Balance 51/56   Time 2   Period Months   Status Achieved   PT LONG TERM GOAL #4   Title Patient ambulates at household level with cane or less & prosthesis carrying cup or plate modified independent.  (Target Date: 04/16/2015)   Baseline MET 04/13/2015 with only prosthesis   Time 2   Period Months   Status Achieved   PT LONG TERM GOAL #5   Title Patient ambulates 400' with LRAD & prosthesis for community moblity modified independent.  (Target Date: 04/16/2015)   Baseline MET 04/13/2015 with single point cane & prosthesis   Time 2   Period Months   Status Achieved   PT LONG TERM GOAL #6   Title Patient negotiates stairs, ramps & curbs with LRAD & prosthesis for community mobility modified independent.  (Target Date: 04/16/2015)   Baseline MET 04/13/2015 with single point cane on ramps & curbs and stairs with 1 rail & cane   Time 2   Period Months   Status Achieved   PT LONG TERM GOAL #7   Title Patient reports chronic back & left knee pain increase </= 2 increments on 0-10 scale with above standing activiities.  (Target Date:  04/16/2015)   Baseline MET 05/12/15    Time 2   Period Months   Status Achieved               Plan - 05-12-15 0845    Clinical Impression Statement Patient met all LTGs. She appears to be using prosthesis safely in her home with cane or no device (depending on pain issues) and community with single point cane. She verbalizes understanding of pain management with monitoring activity level.     Pt will benefit from skilled therapeutic intervention in order to improve on the following deficits Abnormal gait;Pain;Decreased endurance;Decreased strength;Decreased balance   Rehab Potential Good   PT Frequency 2x / week   PT Duration 8 weeks   PT Treatment/Interventions ADLs/Self Care Home Management;DME Instruction;Gait training;Stair training;Functional mobility training;Therapeutic exercise;Therapeutic activities;Balance training;Neuromuscular re-education;Patient/family education;Prosthetic Training   PT Next Visit Plan discharge PT   Consulted and Agree with Plan of Care Patient          G-Codes - May 12, 2015 0845    Functional Assessment Tool Used Patient tolerates wear of prosthesis all awake hours without skin issues or limb pain. She verbalizes & demonstrates proper prosthetic care.    Functional Limitation Self care   Self Care Goal Status [phone]) At least 1 percent but less than 20 percent impaired, limited or restricted   Self Care Discharge Status [phone]) At least 1 percent but less than 20 percent impaired, limited or restricted      Problem List Patient Active Problem List   Diagnosis Date Noted  . Seizure ([place]) 11/13/2014  . Acute renal failure ([place]) 10/29/2014  . Cellulitis of foot 10/24/2014  . Hyponatremia 10/24/2014  . Hypokalemia 10/24/2014  . Diabetic infection of right foot ([place]) 10/24/2014  . DM (diabetes mellitus), type 2, uncontrolled ([place])   . Epilepsy ([place])   . COPD (chronic obstructive pulmonary disease) ([place])   . Bipolar 1 disorder ([place])   . OCD (obsessive compulsive disorder)   .  Hypertension   . COLD (chronic obstructive lung disease) ([place])   . Essential hypertension   . Other epilepsy without status epilepticus, not intractable ([place])     Mallory Potts PT, DPT 05-12-2015, 1:48 PM  [facility] [address] [place], [place], [address] Phone: [phone]   Fax:  [phone]  Name: Mallory Potts MRN: [id number] Date of Birth: 1966-01-27

## 2016-01-25 ENCOUNTER — Encounter (HOSPITAL_COMMUNITY): Payer: Self-pay | Admitting: Student-PharmD

## 2016-03-21 IMAGING — CR DG FOOT COMPLETE 3+V*R*
3 series · 3 of 3 positions shown · non-contrast
Comparison: None.

CLINICAL DATA: Second toe right foot injury 2 weeks ago

EXAM:
RIGHT FOOT COMPLETE - 3+ VIEW

[x foot ap right]
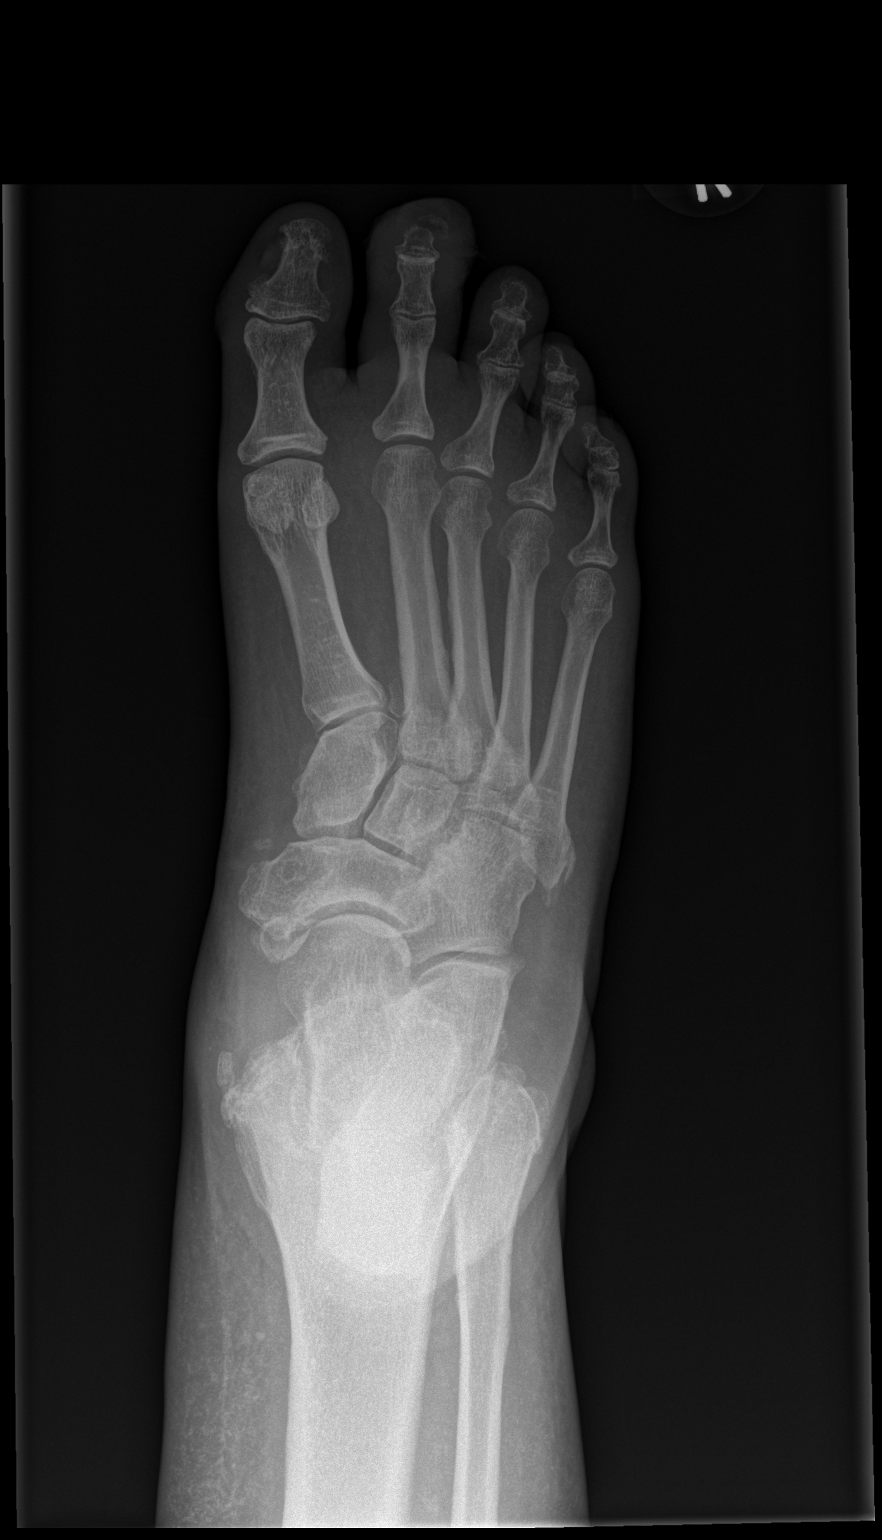

[x foot obl right]
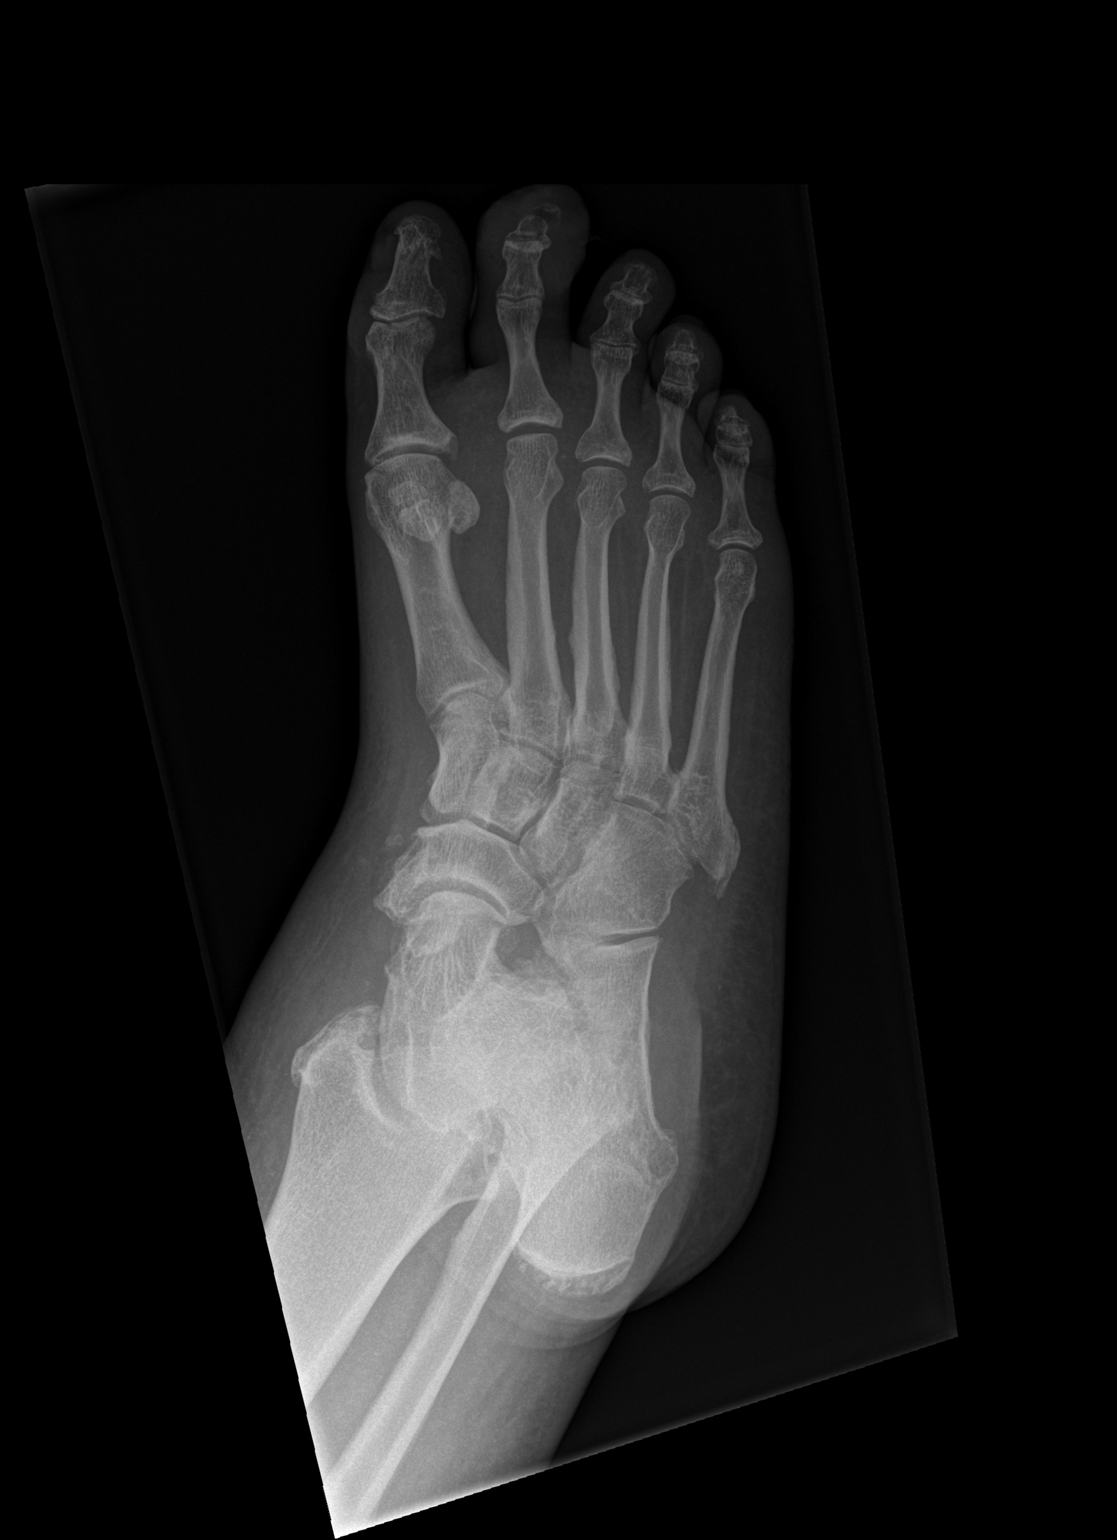

[x foot lat right]
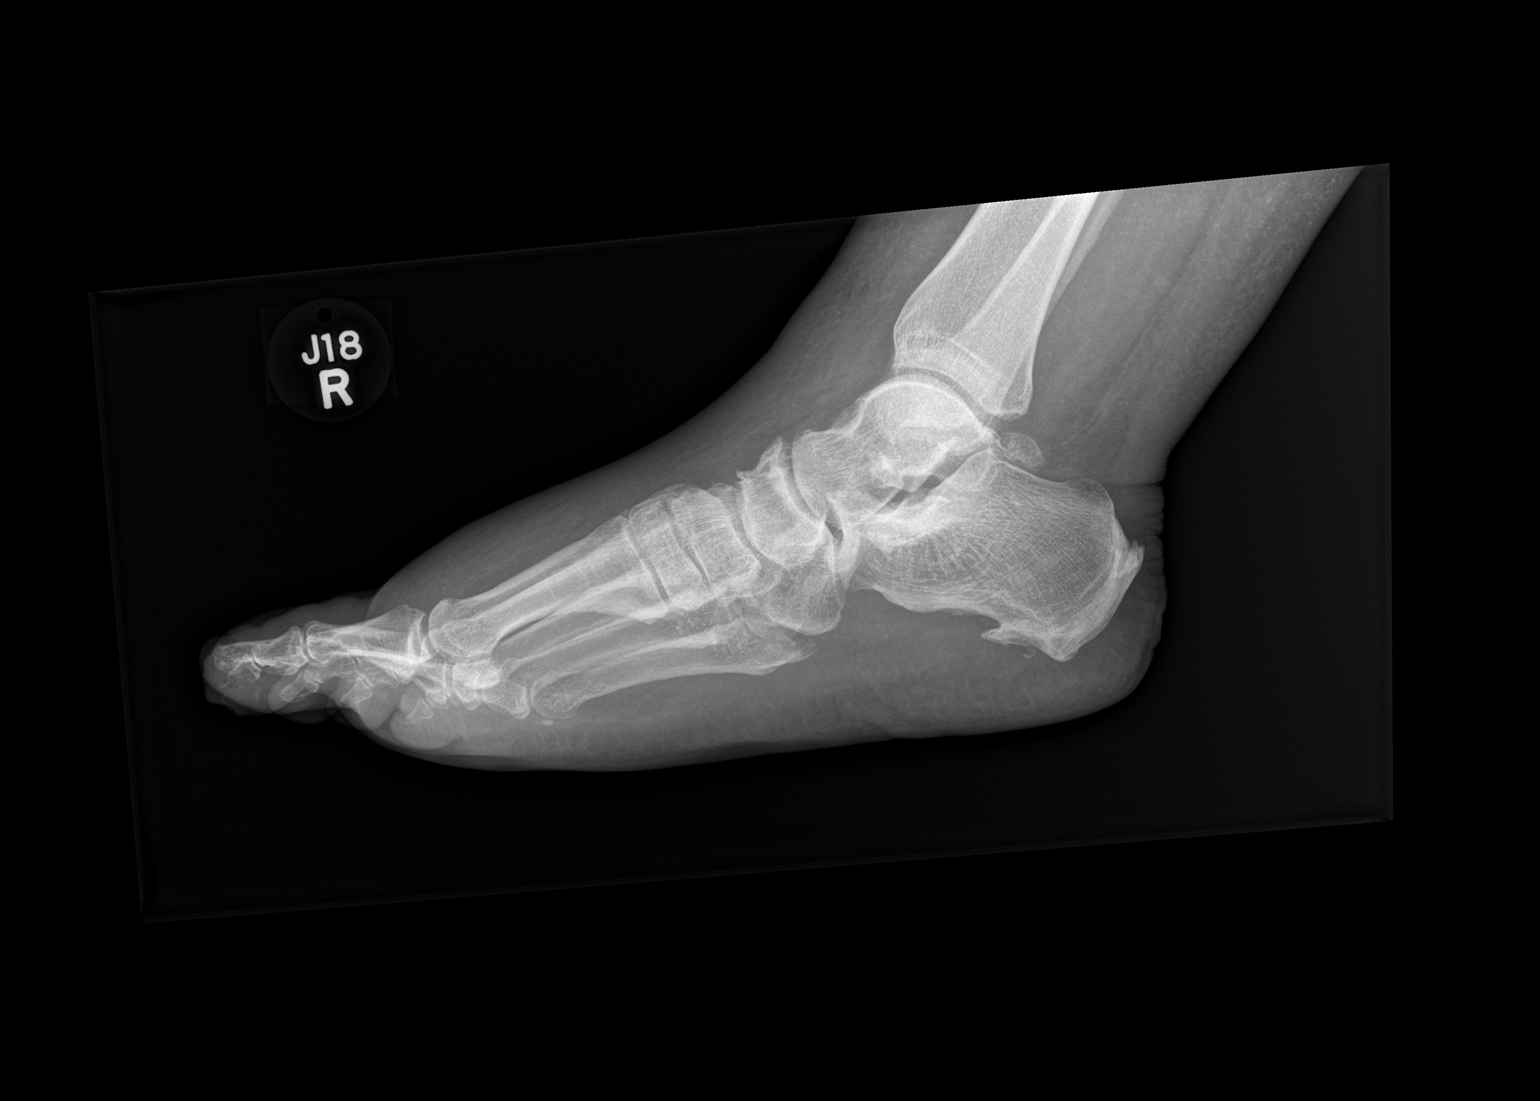

[3 of 3 positions shown; findings below may reference images not displayed]

FINDINGS: Three views of the right foot submitted. There is avulsion fracture
at the tip of distal phalanx second toe. There is soft tissue
swelling and soft tissue irregularity second toe.
IMPRESSION: Avulsion fracture at the tip of distal phalanx second toe. Soft
tissue swelling noted second toe.

## 2020-08-20 DIAGNOSIS — M1990 Unspecified osteoarthritis, unspecified site: Secondary | ICD-10-CM | POA: Insufficient documentation

## 2020-08-20 DIAGNOSIS — M069 Rheumatoid arthritis, unspecified: Secondary | ICD-10-CM | POA: Insufficient documentation

## 2020-08-20 DIAGNOSIS — E039 Hypothyroidism, unspecified: Secondary | ICD-10-CM | POA: Insufficient documentation

## 2020-08-20 DIAGNOSIS — M792 Neuralgia and neuritis, unspecified: Secondary | ICD-10-CM | POA: Insufficient documentation

## 2020-08-20 DIAGNOSIS — N184 Chronic kidney disease, stage 4 (severe): Secondary | ICD-10-CM | POA: Insufficient documentation

## 2020-08-20 DIAGNOSIS — G4700 Insomnia, unspecified: Secondary | ICD-10-CM | POA: Insufficient documentation

## 2020-08-20 DIAGNOSIS — N189 Chronic kidney disease, unspecified: Secondary | ICD-10-CM | POA: Insufficient documentation

## 2021-06-29 DIAGNOSIS — R809 Proteinuria, unspecified: Secondary | ICD-10-CM | POA: Insufficient documentation

## 2021-06-29 DIAGNOSIS — N1832 Chronic kidney disease, stage 3b: Secondary | ICD-10-CM | POA: Insufficient documentation

## 2021-08-08 DIAGNOSIS — Z791 Long term (current) use of non-steroidal anti-inflammatories (NSAID): Secondary | ICD-10-CM | POA: Insufficient documentation

## 2021-08-08 DIAGNOSIS — R809 Proteinuria, unspecified: Secondary | ICD-10-CM | POA: Insufficient documentation

## 2022-01-24 ENCOUNTER — Telehealth: Payer: Self-pay

## 2022-01-24 NOTE — Telephone Encounter (Signed)
Pt is calling inquiring about scheduling a new patient appt as you have been recommended to her by Marylyn Ishihara a current patients of Dr. Quay Burow.  I advised the pt that upon approval I would called her back to set up the first available 11:00 appt.  Please aadvise.

## 2022-01-25 NOTE — Telephone Encounter (Signed)
Judson Roch can you advise me if you are willing to take the New PT

## 2022-01-25 NOTE — Telephone Encounter (Signed)
I can not accept new patients right now - we have a doctor that is leaving in another practice and I am taking on some of her patients.  I would recommend establishing with vickie or sarah

## 2022-01-26 NOTE — Telephone Encounter (Signed)
I reached out to the 2 numbers listed no answer.

## 2022-03-06 ENCOUNTER — Ambulatory Visit: Payer: Medicare HMO | Admitting: Podiatry

## 2022-03-09 ENCOUNTER — Emergency Department (HOSPITAL_COMMUNITY)
Admission: EM | Admit: 2022-03-09 | Discharge: 2022-03-09 | Discharge status: Against Medical Advice | Payer: Medicare HMO

## 2022-03-09 DIAGNOSIS — Z23 Encounter for immunization: Secondary | ICD-10-CM | POA: Diagnosis not present

## 2022-03-09 DIAGNOSIS — Z7984 Long term (current) use of oral hypoglycemic drugs: Secondary | ICD-10-CM | POA: Insufficient documentation

## 2022-03-09 DIAGNOSIS — Z1152 Encounter for screening for COVID-19: Secondary | ICD-10-CM | POA: Insufficient documentation

## 2022-03-09 DIAGNOSIS — R45851 Suicidal ideations: Secondary | ICD-10-CM | POA: Insufficient documentation

## 2022-03-09 DIAGNOSIS — F319 Bipolar disorder, unspecified: Secondary | ICD-10-CM | POA: Diagnosis not present

## 2022-03-09 DIAGNOSIS — E1122 Type 2 diabetes mellitus with diabetic chronic kidney disease: Secondary | ICD-10-CM | POA: Diagnosis not present

## 2022-03-09 DIAGNOSIS — L03116 Cellulitis of left lower limb: Secondary | ICD-10-CM | POA: Insufficient documentation

## 2022-03-09 DIAGNOSIS — S8992XA Unspecified injury of left lower leg, initial encounter: Secondary | ICD-10-CM | POA: Diagnosis present

## 2022-03-09 DIAGNOSIS — F418 Other specified anxiety disorders: Secondary | ICD-10-CM | POA: Insufficient documentation

## 2022-03-09 DIAGNOSIS — Z79899 Other long term (current) drug therapy: Secondary | ICD-10-CM | POA: Diagnosis not present

## 2022-03-09 DIAGNOSIS — W548XXA Other contact with dog, initial encounter: Secondary | ICD-10-CM | POA: Insufficient documentation

## 2022-03-09 DIAGNOSIS — N189 Chronic kidney disease, unspecified: Secondary | ICD-10-CM | POA: Insufficient documentation

## 2022-03-09 DIAGNOSIS — E119 Type 2 diabetes mellitus without complications: Secondary | ICD-10-CM | POA: Diagnosis not present

## 2022-03-09 DIAGNOSIS — N1832 Chronic kidney disease, stage 3b: Secondary | ICD-10-CM | POA: Diagnosis not present

## 2022-03-09 DIAGNOSIS — S81812A Laceration without foreign body, left lower leg, initial encounter: Secondary | ICD-10-CM | POA: Insufficient documentation

## 2022-03-09 DIAGNOSIS — F3130 Bipolar disorder, current episode depressed, mild or moderate severity, unspecified: Secondary | ICD-10-CM | POA: Diagnosis not present

## 2022-03-10 ENCOUNTER — Other Ambulatory Visit: Payer: Self-pay

## 2022-03-10 ENCOUNTER — Emergency Department (HOSPITAL_COMMUNITY)
Admission: EM | Admit: 2022-03-10 | Discharge: 2022-03-10 | Disposition: A | Discharge status: Home / Self Care | Payer: Medicare HMO | Attending: Emergency Medicine | Admitting: Emergency Medicine

## 2022-03-10 ENCOUNTER — Encounter (HOSPITAL_COMMUNITY): Payer: Self-pay

## 2022-03-10 DIAGNOSIS — F3131 Bipolar disorder, current episode depressed, mild: Secondary | ICD-10-CM

## 2022-03-10 DIAGNOSIS — E119 Type 2 diabetes mellitus without complications: Secondary | ICD-10-CM | POA: Diagnosis not present

## 2022-03-10 DIAGNOSIS — R45851 Suicidal ideations: Secondary | ICD-10-CM

## 2022-03-10 DIAGNOSIS — L03116 Cellulitis of left lower limb: Secondary | ICD-10-CM

## 2022-03-10 DIAGNOSIS — N1832 Chronic kidney disease, stage 3b: Secondary | ICD-10-CM

## 2022-03-10 DIAGNOSIS — R739 Hyperglycemia, unspecified: Secondary | ICD-10-CM

## 2022-03-10 DIAGNOSIS — F603 Borderline personality disorder: Secondary | ICD-10-CM | POA: Diagnosis present

## 2022-03-10 DIAGNOSIS — F3130 Bipolar disorder, current episode depressed, mild or moderate severity, unspecified: Secondary | ICD-10-CM | POA: Diagnosis not present

## 2022-03-10 DIAGNOSIS — F419 Anxiety disorder, unspecified: Secondary | ICD-10-CM

## 2022-03-10 DIAGNOSIS — F319 Bipolar disorder, unspecified: Secondary | ICD-10-CM | POA: Diagnosis not present

## 2022-03-10 LAB — I-STAT CHEM 8, ED
BUN: 30 mg/dL — ABNORMAL HIGH (ref 6–20)
Calcium, Ion: 1.16 mmol/L (ref 1.15–1.40)
Chloride: 106 mmol/L (ref 98–111)
Creatinine, Ser: 2.1 mg/dL — ABNORMAL HIGH (ref 0.44–1.00)
Glucose, Bld: 201 mg/dL — ABNORMAL HIGH (ref 70–99)
HCT: 37 % (ref 36.0–46.0)
Hemoglobin: 12.6 g/dL (ref 12.0–15.0)
Potassium: 4.1 mmol/L (ref 3.5–5.1)
Sodium: 138 mmol/L (ref 135–145)
TCO2: 21 mmol/L — ABNORMAL LOW (ref 22–32)

## 2022-03-10 LAB — RESP PANEL BY RT-PCR (FLU A&B, COVID) ARPGX2
Influenza A by PCR: NEGATIVE
Influenza B by PCR: NEGATIVE
SARS Coronavirus 2 by RT PCR: NEGATIVE

## 2022-03-10 MED ORDER — FAMOTIDINE 20 MG PO TABS: 20.0000 mg | ORAL_TABLET | Freq: Two times a day (BID) | ORAL | Status: DC

## 2022-03-10 MED ORDER — ATORVASTATIN CALCIUM 40 MG PO TABS: 40.0000 mg | ORAL_TABLET | Freq: Every day | ORAL | Status: DC

## 2022-03-10 MED ORDER — TETANUS-DIPHTH-ACELL PERTUSSIS 5-2.5-18.5 LF-MCG/0.5 IM SUSY
0.5000 mL | PREFILLED_SYRINGE | Freq: Once | INTRAMUSCULAR | Status: AC
  Administered 2022-03-10: 0.5 mL via INTRAMUSCULAR
  Filled 2022-03-10: qty 0.5

## 2022-03-10 MED ORDER — HYDROCHLOROTHIAZIDE 12.5 MG PO TABS: 12.5000 mg | ORAL_TABLET | Freq: Every day | ORAL | Status: DC

## 2022-03-10 MED ORDER — ACETAMINOPHEN 500 MG PO TABS: 1000.0000 mg | ORAL_TABLET | Freq: Four times a day (QID) | ORAL | Status: DC | PRN

## 2022-03-10 MED ORDER — EMPAGLIFLOZIN 25 MG PO TABS: 25.0000 mg | ORAL_TABLET | Freq: Every day | ORAL | Status: DC

## 2022-03-10 MED ORDER — CEPHALEXIN 250 MG PO CAPS
250.0000 mg | ORAL_CAPSULE | Freq: Three times a day (TID) | ORAL | Status: DC
  Administered 2022-03-10 (×2): 250 mg via ORAL
  Filled 2022-03-10 (×2): qty 1

## 2022-03-10 MED ORDER — LEVOTHYROXINE SODIUM 50 MCG PO TABS: 50.0000 ug | ORAL_TABLET | Freq: Every day | ORAL | Status: DC

## 2022-03-10 MED ORDER — CEPHALEXIN 250 MG PO CAPS: 250.0000 mg | ORAL_CAPSULE | Freq: Three times a day (TID) | ORAL | 0 refills | Status: DC

## 2022-03-10 MED ORDER — HYDROXYZINE HCL 25 MG PO TABS: 25.0000 mg | ORAL_TABLET | Freq: Two times a day (BID) | ORAL | 0 refills | Status: DC | PRN

## 2022-03-10 MED ORDER — VITAMIN D 25 MCG (1000 UNIT) PO TABS: 1000.0000 [IU] | ORAL_TABLET | Freq: Every day | ORAL | Status: DC

## 2022-03-10 MED ORDER — ALBUTEROL SULFATE HFA 108 (90 BASE) MCG/ACT IN AERS: 2.0000 | INHALATION_SPRAY | Freq: Four times a day (QID) | RESPIRATORY_TRACT | Status: DC | PRN

## 2022-03-10 MED ORDER — TRAZODONE HCL 100 MG PO TABS: 50.0000 mg | ORAL_TABLET | Freq: Every day | ORAL | Status: DC

## 2022-03-10 MED ORDER — MOMETASONE FURO-FORMOTEROL FUM 200-5 MCG/ACT IN AERO: 2.0000 | INHALATION_SPRAY | Freq: Two times a day (BID) | RESPIRATORY_TRACT | Status: DC

## 2022-03-10 MED ORDER — GLIPIZIDE 10 MG PO TABS: 10.0000 mg | ORAL_TABLET | Freq: Two times a day (BID) | ORAL | Status: DC

## 2022-03-10 NOTE — ED Triage Notes (Signed)
Pt states has hx of bipolar, borderline personality disorder and depression. Has been having suicidal thoughts lately, has been off meds for years.

## 2022-03-10 NOTE — Discharge Instructions (Addendum)
It was our pleasure to provide your ER care today - we hope that you feel better.  Take antibiotic as prescribed. Keep area very clean/dry.   Your blood sugar is high - drink plenty of fluids, continue meds, monitor sugars, and f/u closely with primary care doctor in the coming week. Also follow up closely with your doctor regarding your blood pressure and your chronic kidney disease. As relates the chronic kidney disease, avoid taking nsaid type pain meds such as ibuprofen/motrin, aleve/naprosyn, mobic, etc.   Follow up closely with behavioral health provider in the coming week. For mental health issues and/or crisis you may also go directly to the [facility] - it is open 24/7 and walk-ins are welcome.  Return to ER if worse, new symptoms, fevers, chest pain, trouble breathing, spreading redness, increased swelling, severe pain, or other emergency concern.     [facility] [address]  Assessment/Therapy Walk-Ins will be available on Monday and Wednesday's Only  Monday/Wednesday: 8 AM until slots are full Every 1st and 2nd Friday of the month: 1 pm - 5 pm  For Monday/Wednesday, it is recommended that patients arrive between 7:30 AM and 7:45 AM because patients will be seen in the order of arrival.  Go to the second floor on arrival and check in. For Fridays, it is recommended that patients arrive between 12 noon and 12:30 pm. Go to the second floor on arrival and check in.  **Availability is limited; therefore, patients may not be seen on the same day.**  Psychiatry/Medication Management Walk-Ins will be available Monday-Friday  Monday-Friday: 8 AM to 11 AM.  It is recommended that patients arrive by 7:30 AM to 7:45 AM because patients will be seen in the order of arrival.  Go to the second floor on arrival and check in.  **Availability is limited; therefore, patients may not be seen on the same day.**   =================================================================================== Discharge recommendations:  Patient is to take medications as prescribed. Please see information for follow-up appointment with psychiatry and therapy. Please follow up with your primary care provider for all medical related needs.   Therapy: We recommend that patient participate in individual therapy to address mental health concerns.  Medications: The patient is to contact a medical professional and/or outpatient provider to address any new side effects that develop. Patient should update outpatient providers of any new medications and/or medication changes.   Atypical antipsychotics: If you are prescribed an atypical antipsychotic, it is recommended that your height, weight, BMI, blood pressure, fasting lipid panel, and fasting blood sugar be monitored by your outpatient providers.  Safety:  The patient should abstain from use of illicit substances/drugs and abuse of any medications. If symptoms worsen or do not continue to improve or if the patient becomes actively suicidal or homicidal then it is recommended that the patient return to the closest hospital emergency department, the [facility], or call 911 for further evaluation and treatment. National Suicide Prevention Lifeline 1-800-SUICIDE or 248-163-3989.  About 988 988 offers 24/7 access to trained crisis counselors who can help people experiencing mental health-related distress. People can call or text 988 or chat 988lifeline.org for themselves or if they are worried about a loved one who may need crisis support.  Crisis Mobile: [facility]:                     9191834858 (for crisis response 24 hours a day) [place]:  1-800-256-2452  
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
## 2022-03-10 NOTE — ED Provider Notes (Signed)
[facility] DEPT Provider Note   CSN: [id number] Arrival date & time: 03/09/22  2213     History  Chief Complaint  Patient presents with   Suicidal    Mallory Potts is a 56 y.o. female with history significant for bipolar 1 disorder, borderline personality disorder, depression, DM2 presents to ER due to concerns of suicidal thoughts.  Patient states she has been having intrusive thoughts for the past year, progressively worsening over the last 6 months.  She states she has outbursts of anger, up-and-down moods, and "feels the need to harm herself to prevent herself from hitting others".  She admits to banging her head against walls, door frames, or punching herself in the head or leg when she feels overstimulated.  She denies having a specific plan, but did state tonight she wanted to jump from her balcony head first.  Denies alcohol use.  She admits to smoking marijuana, states it helps with her moods.  Patient has been off of psychiatric medications due to gaps in psychiatric care and being unable to find a provider in her insurance network.  She also has concerns of possible cellulitis in her left leg.  Her dog caused a small cut to her shin, over the last 4 days her leg is turning red and swelling.  Denies fever, chills, auditory or visual hallucinations,   HPI     Home Medications Prior to Admission medications   Medication Sig Start Date End Date Taking? Authorizing Provider  acetaminophen (TYLENOL) 500 MG tablet Take 1,000 mg by mouth every 6 (six) hours as needed for mild pain, moderate pain or headache.   Yes [provider]  albuterol (PROVENTIL HFA;VENTOLIN HFA) 108 (90 BASE) MCG/ACT inhaler Inhale 2 puffs into the lungs every 6 (six) hours as needed for wheezing. Breathing 11/08/11  Yes Melynda Ripple, MD  atorvastatin (LIPITOR) 40 MG tablet Take 40 mg by mouth daily.   Yes [provider]  empagliflozin (JARDIANCE) 25 MG TABS  tablet Take 25 mg by mouth daily.   Yes [provider]  famotidine (PEPCID) 20 MG tablet Take 20 mg by mouth 2 (two) times daily.   Yes [provider]  fluticasone (FLONASE) 50 MCG/ACT nasal spray Place 1 spray into both nostrils daily.   Yes [provider]  fluticasone-salmeterol (ADVAIR) 250-50 MCG/ACT AEPB Inhale 1 puff into the lungs in the morning and at bedtime.   Yes [provider]  glipiZIDE (GLUCOTROL) 10 MG tablet Take 10 mg by mouth 2 (two) times daily with a meal.   Yes [provider]  hydrochlorothiazide (HYDRODIURIL) 12.5 MG tablet Take 12.5 mg by mouth daily.   Yes [provider]  levothyroxine (SYNTHROID, LEVOTHROID) 50 MCG tablet Take 50 mcg by mouth daily before breakfast.   Yes [provider]  traZODone (DESYREL) 50 MG tablet Take 50 mg by mouth at bedtime.   Yes [provider]  VITAMIN D PO Take 1 tablet by mouth daily.   Yes [provider]  docusate sodium (COLACE) 100 MG capsule Take 1 capsule (100 mg total) by mouth 2 (two) times daily. Patient not taking: Reported on 03/10/2022 10/30/14   Regalado, Jerald Kief A, MD  insulin aspart (NOVOLOG) 100 UNIT/ML injection Inject 4 Units into the skin 3 (three) times daily with meals. Patient not taking: Reported on 02/15/2015 10/30/14   Regalado, Jerald Kief A, MD  insulin glargine (LANTUS) 100 UNIT/ML injection Inject 0.22 mLs (22 Units total) into the skin at  bedtime. Patient not taking: Reported on 02/15/2015 10/30/14   Regalado, Jerald Kief A, MD  spironolactone (ALDACTONE) 25 MG tablet Take 1 tablet (25 mg total) by mouth daily. 06/19/11 10/26/11  Rolland Porter, MD      Allergies    Lithium, Milk-related compounds, Other, Abilify [aripiprazole], and Aripiprazole    Review of Systems   Review of Systems  Constitutional:  Negative for chills and fever.  Skin:  Positive for color change and wound.       Redness and swelling in LLE  Psychiatric/Behavioral:   Positive for behavioral problems, dysphoric mood, self-injury and suicidal ideas. Negative for agitation, confusion and hallucinations. The patient is nervous/anxious.     Physical Exam Updated Vital Signs BP (!) 152/67 (BP Location: Left Arm)   Pulse (!) 59   Temp 97.7 F (36.5 C) (Oral)   Resp 18   Ht 5\' 3"  (1.6 m)   Wt 96.1 kg   SpO2 98%   BMI 37.53 kg/m  Physical Exam Vitals and nursing note reviewed.  Constitutional:      General: She is not in acute distress.    Appearance: Normal appearance. She is not ill-appearing or diaphoretic.  Cardiovascular:     Rate and Rhythm: Normal rate.  Pulmonary:     Effort: Pulmonary effort is normal.  Musculoskeletal:     Left lower leg: Swelling, laceration and tenderness present. 2+ Edema present.     Comments: Erythema and swelling to anterior of left lower extremity, inferior to knee.  No calf tenderness.  Negative Homan's sign.   Neurological:     Mental Status: She is alert. Mental status is at baseline.  Psychiatric:        Attention and Perception: Attention and perception normal. She does not perceive auditory or visual hallucinations.        Mood and Affect: Mood is anxious and depressed.        Speech: Speech is rapid and pressured.        Behavior: Behavior normal. Behavior is cooperative.        Thought Content: Thought content is not paranoid or delusional. Thought content includes suicidal ideation. Thought content does not include homicidal ideation. Thought content includes suicidal plan. Thought content does not include homicidal plan.        Cognition and Memory: Cognition normal.        Judgment: Judgment is impulsive.     ED Results / Procedures / Treatments   Labs (all labs ordered are listed, but only abnormal results are displayed) Labs Reviewed  I-STAT CHEM 8, ED - Abnormal; Notable for the following components:      Result Value   BUN 30 (*)    Creatinine, Ser 2.10 (*)    Glucose, Bld 201 (*)    TCO2 21  (*)    All other components within normal limits  RESP PANEL BY RT-PCR (FLU A&B, COVID) ARPGX2    EKG None  Radiology No results found.  Procedures Procedures    Medications Ordered in ED Medications  acetaminophen (TYLENOL) tablet 1,000 mg (has no administration in time range)  albuterol (VENTOLIN HFA) 108 (90 Base) MCG/ACT inhaler 2 puff (has no administration in time range)  atorvastatin (LIPITOR) tablet 40 mg (has no administration in time range)  empagliflozin (JARDIANCE) tablet 25 mg (has no administration in time range)  famotidine (PEPCID) tablet 20 mg (has no administration in time range)  mometasone-formoterol (DULERA) 200-5 MCG/ACT inhaler 2 puff (has no administration in time  range)  glipiZIDE (GLUCOTROL) tablet 10 mg (has no administration in time range)  hydrochlorothiazide (HYDRODIURIL) tablet 12.5 mg (has no administration in time range)  levothyroxine (SYNTHROID) tablet 50 mcg (has no administration in time range)  traZODone (DESYREL) tablet 50 mg (has no administration in time range)  cephALEXin (KEFLEX) capsule 250 mg (has no administration in time range)  cholecalciferol (VITAMIN D3) 25 MCG (1000 UNIT) tablet 1,000 Units (has no administration in time range)    ED Course/ Medical Decision Making/ A&P                           Medical Decision Making  Patient presents to ER with concerns of SI, worsening over the last 6 months.  Tonight she had thoughts of jumping off her balcony, but stopped herself.  She reports harming herself at least 2 times per week, and is tired of feeling the way she does.  She is not currently followed by behavioral health and has not been on medications in at least 2 years.  Patient reports having relationship issues with her sister, whom she lives with, which may also be contributing to patient's behavior.  I obtained additional history from EMR including visits with nephrology from 08/08/21.   Patient is anxious, rocking back and  forth during patient interview and exam.  She is cooperative, non-violent.  Continues to deny HI.  Has active SI with no specific plan or intent.  Left lower extremity has erythema, swelling and warmth with concerns for cellulitis.  Patient has history of cellulitis.  No purulent drainage or exudate.   Ordered and reviewed I-stat chem 8 results.  Patient has altered kidney function, Cr 2.10, BUN 30 and hyperglycemia.  She has known history of CKD and DM2.  She is asymptomatic.   Due to patient concerns of active suicidal ideation and increased incidences of patient harming herself, plan to consult TTS and place patient on ED pysch hold until further evaluation.  I feel that patient is high risk for suicide and psych hold necessary to maintain patient safety.   Left lower extremity likely developing cellulitis.  Will treat with oral cephalexin x7 days.  Adjusted dose based on kidney impairment.   Discussed HPI, evaluation, assessment and plan with attending Deno Etienne, who agrees with plan.           Final Clinical Impression(s) / ED Diagnoses Final diagnoses:  Suicidal ideation  Cellulitis of left anterior lower leg    Rx / DC Orders ED Discharge Orders     None         Pat Kocher, PA 03/10/22 [place], [place], DO 03/10/22 [phone]

## 2022-03-10 NOTE — ED Provider Notes (Addendum)
Emergency Medicine Observation Re-evaluation Note  Mallory Potts is a 56 y.o. female, seen on rounds today.  Pt initially presented to the ED for complaints of anxiety and intermittent SI.  BH team Adora Fridge) has assessed and indicates pt has been psych cleared for d/c.    Physical Exam  BP (!) 152/67 (BP Location: Left Arm)   Pulse (!) 59   Temp 97.7 F (36.5 C) (Oral)   Resp 18   Ht 1.6 m (5\' 3" )   Wt 96.1 kg   SpO2 98%   BMI 37.53 kg/m  Physical Exam General: alert, content, conversant.  Cardiac: regular rate.  Lungs: breathing comfortably. Psych: pt with normal mood and affect. No current SI/HI. Pt is not responding to internal stimuli - no hallucinations or delusions noted.  MSK: small abrasion left anterior lower leg - healing. Mild surrounding erythema, no abscess. No crepitus. No significant sts.   ED Course / MDM    I have reviewed the labs performed to date as well as medications administered while in observation.  Recent changes in the last 24 hours include ED obs, reassessment.   Plan  Pt has been psych cleared by [facility] team for d/c, and they have provided pt med for anxiety. Pt agreeable w d/c plan.   Will continue keflex for mild cellulitis. Pt unsure of tetanus, recent abrasion to area, tetanus I'm.    Rec close pcp f/u.  Return precautions provided.        Lajean Saver, MD 03/10/22 [id number]

## 2022-03-10 NOTE — ED Notes (Signed)
Pt was changed out into purple scrubs and belongings were placed in the 9-12 nurses station cabinets.

## 2022-03-13 ENCOUNTER — Ambulatory Visit (INDEPENDENT_AMBULATORY_CARE_PROVIDER_SITE_OTHER): Payer: Medicare HMO | Admitting: Psychiatry

## 2022-03-13 ENCOUNTER — Telehealth (HOSPITAL_COMMUNITY): Payer: Self-pay | Admitting: *Deleted

## 2022-03-13 ENCOUNTER — Other Ambulatory Visit (HOSPITAL_COMMUNITY): Payer: Self-pay | Admitting: Psychiatry

## 2022-03-13 ENCOUNTER — Encounter (HOSPITAL_COMMUNITY): Payer: Self-pay | Admitting: Psychiatry

## 2022-03-13 DIAGNOSIS — F4310 Post-traumatic stress disorder, unspecified: Secondary | ICD-10-CM

## 2022-03-13 DIAGNOSIS — F319 Bipolar disorder, unspecified: Secondary | ICD-10-CM

## 2022-03-13 DIAGNOSIS — F411 Generalized anxiety disorder: Secondary | ICD-10-CM

## 2022-03-13 MED ORDER — LURASIDONE HCL 20 MG PO TABS: 20.0000 mg | ORAL_TABLET | Freq: Every evening | ORAL | 3 refills | Status: DC

## 2022-03-13 MED ORDER — HYDROXYZINE HCL 25 MG PO TABS: 25.0000 mg | ORAL_TABLET | Freq: Three times a day (TID) | ORAL | 3 refills | Status: DC

## 2022-03-13 MED ORDER — TRAZODONE HCL 50 MG PO TABS: 50.0000 mg | ORAL_TABLET | Freq: Every day | ORAL | 3 refills | Status: DC

## 2022-03-13 NOTE — Telephone Encounter (Signed)
Medications refilled and sent to preferred pharmacy.
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
## 2022-03-13 NOTE — Telephone Encounter (Signed)
Patient LVM requesting her med's be sent to  [id number] - [place], [place]  Stated it's closer to her house

## 2022-03-15 ENCOUNTER — Telehealth (HOSPITAL_COMMUNITY): Payer: Self-pay | Admitting: *Deleted

## 2022-03-15 NOTE — Telephone Encounter (Signed)
Submitted PA for patients lurasidone via fax to [facility]. Waiting on the determination which can take up to 72 hours.

## 2022-03-16 ENCOUNTER — Telehealth (HOSPITAL_COMMUNITY): Payer: Self-pay | Admitting: *Deleted

## 2022-03-16 NOTE — Telephone Encounter (Signed)
Dr Ronne Binning called patient to see if she had any former use of Quetiapine as [employer] denied the PA for Latuda due to her not having a documented try and fail of it. Patient reported she had tried and failed it because it made her too sedated on it and couldn't function in her day. Appeal faxed in with this new information and the note stating it from provider along with last provider note from 03/13/22. Waiting on the determination.

## 2022-03-16 NOTE — Telephone Encounter (Signed)
Thank you for this update 

## 2022-03-16 NOTE — Telephone Encounter (Signed)
Denial received for patients Lurasidone as she has not tried and failed quetiapine first. I will forward this information to her provider.

## 2022-03-16 NOTE — Telephone Encounter (Signed)
Patient informed Probation officer that she has trailed Seroquel and was unable to wake up. Provider informed Probation officer that she will have it reapplied. No other concerns noted at this time.

## 2022-03-23 ENCOUNTER — Ambulatory Visit: Payer: Medicare HMO | Admitting: Podiatry

## 2022-03-23 DIAGNOSIS — M778 Other enthesopathies, not elsewhere classified: Secondary | ICD-10-CM

## 2022-03-28 ENCOUNTER — Telehealth (HOSPITAL_COMMUNITY): Payer: Self-pay | Admitting: *Deleted

## 2022-03-28 NOTE — Telephone Encounter (Signed)
Followed up with [facility] re appeal on her lurasidone. Stated it was approved on 03/18/22 and then I notified pharmacy. Pharmacy says its only saying its too soon for a refill not pending a auth. PA is good till 06/21/22.

## 2022-03-30 ENCOUNTER — Ambulatory Visit (INDEPENDENT_AMBULATORY_CARE_PROVIDER_SITE_OTHER): Payer: Medicare HMO

## 2022-03-30 ENCOUNTER — Ambulatory Visit: Payer: Medicare HMO | Admitting: Podiatry

## 2022-03-30 ENCOUNTER — Encounter: Payer: Self-pay | Admitting: Podiatry

## 2022-03-30 DIAGNOSIS — B351 Tinea unguium: Secondary | ICD-10-CM

## 2022-03-30 DIAGNOSIS — M778 Other enthesopathies, not elsewhere classified: Secondary | ICD-10-CM

## 2022-03-30 DIAGNOSIS — M7752 Other enthesopathy of left foot: Secondary | ICD-10-CM | POA: Diagnosis not present

## 2022-03-30 DIAGNOSIS — M79676 Pain in unspecified toe(s): Secondary | ICD-10-CM | POA: Diagnosis not present

## 2022-03-30 MED ORDER — TRIAMCINOLONE ACETONIDE 40 MG/ML IJ SUSP
20.0000 mg | Freq: Once | INTRAMUSCULAR | Status: AC
  Administered 2022-03-30: 20 mg

## 2022-04-02 NOTE — Progress Notes (Signed)
Subjective:  Patient ID: Mallory Potts, female    DOB: 08-04-1965,  MRN: [id number] HPI Chief Complaint  Patient presents with   Foot Pain    Latera (5th met base) left - knot x 1 month, having trouble walking now, can't cut toenails well, feeling a lump on the bottom now   Diabetes    Last A1c was 11.1   New Patient (Initial Visit)    56 y.o. female presents with the above complaint.   ROS: Denies fever chills nausea vomit muscle aches pains calf pain back pain chest pain shortness of breath.  Past Medical History:  Diagnosis Date   Anxiety    Arthritis    Asthma    Bipolar 1 disorder (HCC)    COPD (chronic obstructive pulmonary disease) (HCC)    COPD (chronic obstructive pulmonary disease) (HCC)    Depression    Diabetes mellitus    Epilepsy ([place])    Hypertension    OCD (obsessive compulsive disorder)    Peripheral edema    Seizures ([place])    Thyroid disease    Past Surgical History:  Procedure Laterality Date   AMPUTATION Right 10/24/2014   Procedure: AMPUTATION RAY RIGHT SECOND TOE;  Surgeon: Netta Cedars, MD;  Location: [facility];  Service: Orthopedics;  Laterality: Right;   AMPUTATION Right 10/27/2014   Procedure: RIGHT BELOW KNEE AMPUTATION;  Surgeon: [name], MD;  Location: [facility];  Service: Orthopedics;  Laterality: Right;   KNEE ARTHROSCOPY     TONSILLECTOMY      Current Outpatient Medications:    acetaminophen (TYLENOL) 500 MG tablet, Take 1,000 mg by mouth every 6 (six) hours as needed for mild pain, moderate pain or headache., Disp: , Rfl:    albuterol (PROVENTIL HFA;VENTOLIN HFA) 108 (90 BASE) MCG/ACT inhaler, Inhale 2 puffs into the lungs every 6 (six) hours as needed for wheezing. Breathing, Disp: , Rfl:    atorvastatin (LIPITOR) 40 MG tablet, Take 40 mg by mouth daily., Disp: , Rfl:    empagliflozin (JARDIANCE) 25 MG TABS tablet, Take 25 mg by mouth daily., Disp: , Rfl:    famotidine (PEPCID) 20 MG tablet, Take 20 mg by mouth 2 (two) times daily., Disp: ,  Rfl:    fluticasone (FLONASE) 50 MCG/ACT nasal spray, Place 1 spray into both nostrils daily., Disp: , Rfl:    fluticasone-salmeterol (ADVAIR) 250-50 MCG/ACT AEPB, Inhale 1 puff into the lungs in the morning and at bedtime., Disp: , Rfl:    glipiZIDE (GLUCOTROL) 10 MG tablet, Take 10 mg by mouth 2 (two) times daily with a meal., Disp: , Rfl:    hydrochlorothiazide (HYDRODIURIL) 12.5 MG tablet, Take 12.5 mg by mouth daily., Disp: , Rfl:    hydrOXYzine (ATARAX) 25 MG tablet, Take 1 tablet (25 mg total) by mouth 3 (three) times daily., Disp: 90 tablet, Rfl: 3   levothyroxine (SYNTHROID, LEVOTHROID) 50 MCG tablet, Take 50 mcg by mouth daily before breakfast., Disp: , Rfl:    lurasidone (LATUDA) 20 MG TABS tablet, Take 1 tablet (20 mg total) by mouth at bedtime., Disp: 30 tablet, Rfl: 3   traZODone (DESYREL) 50 MG tablet, Take 1-3 tablets (50-150 mg total) by mouth at bedtime., Disp: 90 tablet, Rfl: 3   VITAMIN D PO, Take 1 tablet by mouth daily., Disp: , Rfl:   Allergies  Allergen Reactions   Lithium Shortness Of Breath   Milk-Related Compounds Nausea And Vomiting   Other     Bell peppers. Mouth sores.  Abilify [Aripiprazole] Rash   Aripiprazole Rash   Hydrocortisone Rash   Review of Systems Objective:  There were no vitals filed for this visit.  General: Well developed, nourished, in no acute distress, alert and oriented x3   Dermatological: Skin is warm, dry and supple left. Nails x 5 are well maintained; remaining integument appears unremarkable at this time. There are no open sores, no preulcerative lesions, no rash or signs of infection present.  Vascular: Dorsalis Pedis artery and Posterior Tibial artery pedal pulses are 1/4 left with immedate capillary fill time. Pedal hair growth present. No varicosities and no lower extremity edema present bilateral.   Neruologic: Grossly intact via light touch left. Vibratory intact via tuning fork left foot. Protective threshold with Semmes  Wienstein monofilament diminished to all pedal sites left.  Patellar and Achilles deep tendon reflexes 2+ left. No Babinski or clonus noted left.   Musculoskeletal: No gross boney pedal deformities left. No pain, crepitus, or limitation noted with foot and ankle range of motion bilateral. Muscular strength 5/5 in all groups tested left.  Pain and fluctuance on palpation fifth met base .  Gait: Unassisted, Nonantalgic.    Radiographs:  None taken Assessment & Plan:   Assessment: Peripheral vascular disease history of amputation bursitis painful elongated nails.  Plan: Injected 10 mg Kenalog 5 mg Marcaine point maximal tenderness to the fifth met base and debrided toenails 1 through 5 left foot     Mallory Potts T. Sarah Ann, [place]

## 2022-05-08 ENCOUNTER — Encounter (HOSPITAL_COMMUNITY): Payer: Self-pay

## 2022-05-08 ENCOUNTER — Inpatient Hospital Stay (HOSPITAL_COMMUNITY)
Admission: EM | Admit: 2022-05-08 | Discharge: 2022-05-13 | DRG: 194 | Disposition: A | Discharge status: Home / Self Care | Payer: Medicare HMO | Attending: Internal Medicine | Admitting: Internal Medicine

## 2022-05-08 ENCOUNTER — Other Ambulatory Visit: Payer: Self-pay

## 2022-05-08 DIAGNOSIS — F429 Obsessive-compulsive disorder, unspecified: Secondary | ICD-10-CM | POA: Diagnosis present

## 2022-05-08 DIAGNOSIS — J441 Chronic obstructive pulmonary disease with (acute) exacerbation: Secondary | ICD-10-CM | POA: Diagnosis present

## 2022-05-08 DIAGNOSIS — F4310 Post-traumatic stress disorder, unspecified: Secondary | ICD-10-CM | POA: Diagnosis present

## 2022-05-08 DIAGNOSIS — D631 Anemia in chronic kidney disease: Secondary | ICD-10-CM | POA: Diagnosis not present

## 2022-05-08 DIAGNOSIS — Z79890 Hormone replacement therapy (postmenopausal): Secondary | ICD-10-CM

## 2022-05-08 DIAGNOSIS — I10 Essential (primary) hypertension: Secondary | ICD-10-CM | POA: Diagnosis present

## 2022-05-08 DIAGNOSIS — E1122 Type 2 diabetes mellitus with diabetic chronic kidney disease: Secondary | ICD-10-CM | POA: Diagnosis not present

## 2022-05-08 DIAGNOSIS — Z833 Family history of diabetes mellitus: Secondary | ICD-10-CM

## 2022-05-08 DIAGNOSIS — Z825 Family history of asthma and other chronic lower respiratory diseases: Secondary | ICD-10-CM

## 2022-05-08 DIAGNOSIS — G40909 Epilepsy, unspecified, not intractable, without status epilepticus: Secondary | ICD-10-CM | POA: Diagnosis present

## 2022-05-08 DIAGNOSIS — F319 Bipolar disorder, unspecified: Secondary | ICD-10-CM | POA: Diagnosis present

## 2022-05-08 DIAGNOSIS — M069 Rheumatoid arthritis, unspecified: Secondary | ICD-10-CM | POA: Diagnosis present

## 2022-05-08 DIAGNOSIS — J189 Pneumonia, unspecified organism: Secondary | ICD-10-CM | POA: Diagnosis not present

## 2022-05-08 DIAGNOSIS — J440 Chronic obstructive pulmonary disease with acute lower respiratory infection: Secondary | ICD-10-CM | POA: Diagnosis present

## 2022-05-08 DIAGNOSIS — Z79899 Other long term (current) drug therapy: Secondary | ICD-10-CM

## 2022-05-08 DIAGNOSIS — L89152 Pressure ulcer of sacral region, stage 2: Secondary | ICD-10-CM | POA: Diagnosis not present

## 2022-05-08 DIAGNOSIS — J168 Pneumonia due to other specified infectious organisms: Secondary | ICD-10-CM | POA: Diagnosis not present

## 2022-05-08 DIAGNOSIS — M1990 Unspecified osteoarthritis, unspecified site: Secondary | ICD-10-CM | POA: Diagnosis present

## 2022-05-08 DIAGNOSIS — F419 Anxiety disorder, unspecified: Secondary | ICD-10-CM | POA: Diagnosis present

## 2022-05-08 DIAGNOSIS — N1832 Chronic kidney disease, stage 3b: Secondary | ICD-10-CM | POA: Diagnosis present

## 2022-05-08 DIAGNOSIS — Z8249 Family history of ischemic heart disease and other diseases of the circulatory system: Secondary | ICD-10-CM

## 2022-05-08 DIAGNOSIS — Z7984 Long term (current) use of oral hypoglycemic drugs: Secondary | ICD-10-CM

## 2022-05-08 DIAGNOSIS — R112 Nausea with vomiting, unspecified: Secondary | ICD-10-CM | POA: Diagnosis present

## 2022-05-08 DIAGNOSIS — F17210 Nicotine dependence, cigarettes, uncomplicated: Secondary | ICD-10-CM | POA: Diagnosis present

## 2022-05-08 DIAGNOSIS — I129 Hypertensive chronic kidney disease with stage 1 through stage 4 chronic kidney disease, or unspecified chronic kidney disease: Secondary | ICD-10-CM | POA: Diagnosis present

## 2022-05-08 DIAGNOSIS — E869 Volume depletion, unspecified: Secondary | ICD-10-CM | POA: Diagnosis not present

## 2022-05-08 DIAGNOSIS — E039 Hypothyroidism, unspecified: Secondary | ICD-10-CM | POA: Diagnosis present

## 2022-05-08 DIAGNOSIS — Z7951 Long term (current) use of inhaled steroids: Secondary | ICD-10-CM

## 2022-05-08 DIAGNOSIS — R109 Unspecified abdominal pain: Secondary | ICD-10-CM | POA: Diagnosis present

## 2022-05-08 DIAGNOSIS — F603 Borderline personality disorder: Secondary | ICD-10-CM | POA: Diagnosis present

## 2022-05-08 DIAGNOSIS — E669 Obesity, unspecified: Secondary | ICD-10-CM | POA: Diagnosis present

## 2022-05-08 DIAGNOSIS — Z89511 Acquired absence of right leg below knee: Secondary | ICD-10-CM | POA: Diagnosis not present

## 2022-05-08 DIAGNOSIS — Z720 Tobacco use: Secondary | ICD-10-CM | POA: Diagnosis present

## 2022-05-08 DIAGNOSIS — Z1152 Encounter for screening for COVID-19: Secondary | ICD-10-CM | POA: Diagnosis not present

## 2022-05-08 DIAGNOSIS — N184 Chronic kidney disease, stage 4 (severe): Secondary | ICD-10-CM | POA: Diagnosis present

## 2022-05-08 DIAGNOSIS — R0781 Pleurodynia: Secondary | ICD-10-CM | POA: Diagnosis not present

## 2022-05-08 DIAGNOSIS — R1111 Vomiting without nausea: Secondary | ICD-10-CM | POA: Diagnosis not present

## 2022-05-08 DIAGNOSIS — J449 Chronic obstructive pulmonary disease, unspecified: Secondary | ICD-10-CM | POA: Diagnosis present

## 2022-05-08 DIAGNOSIS — I700 Atherosclerosis of aorta: Secondary | ICD-10-CM | POA: Diagnosis present

## 2022-05-08 DIAGNOSIS — J1000 Influenza due to other identified influenza virus with unspecified type of pneumonia: Principal | ICD-10-CM | POA: Diagnosis present

## 2022-05-08 DIAGNOSIS — R062 Wheezing: Secondary | ICD-10-CM | POA: Diagnosis not present

## 2022-05-08 DIAGNOSIS — D649 Anemia, unspecified: Secondary | ICD-10-CM | POA: Diagnosis present

## 2022-05-08 DIAGNOSIS — F17200 Nicotine dependence, unspecified, uncomplicated: Secondary | ICD-10-CM | POA: Diagnosis present

## 2022-05-08 DIAGNOSIS — Z6837 Body mass index (BMI) 37.0-37.9, adult: Secondary | ICD-10-CM

## 2022-05-08 DIAGNOSIS — Z888 Allergy status to other drugs, medicaments and biological substances status: Secondary | ICD-10-CM

## 2022-05-08 DIAGNOSIS — R739 Hyperglycemia, unspecified: Secondary | ICD-10-CM | POA: Diagnosis not present

## 2022-05-08 DIAGNOSIS — E1165 Type 2 diabetes mellitus with hyperglycemia: Secondary | ICD-10-CM | POA: Diagnosis present

## 2022-05-08 DIAGNOSIS — R059 Cough, unspecified: Secondary | ICD-10-CM | POA: Diagnosis not present

## 2022-05-08 LAB — CBG MONITORING, ED: Glucose-Capillary: 530 mg/dL (ref 70–99)
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
## 2022-05-09 ENCOUNTER — Emergency Department (HOSPITAL_COMMUNITY): Payer: Medicare HMO

## 2022-05-09 DIAGNOSIS — J189 Pneumonia, unspecified organism: Secondary | ICD-10-CM | POA: Diagnosis present

## 2022-05-09 DIAGNOSIS — R112 Nausea with vomiting, unspecified: Secondary | ICD-10-CM | POA: Diagnosis present

## 2022-05-09 DIAGNOSIS — M1990 Unspecified osteoarthritis, unspecified site: Secondary | ICD-10-CM | POA: Diagnosis present

## 2022-05-09 DIAGNOSIS — L89152 Pressure ulcer of sacral region, stage 2: Secondary | ICD-10-CM | POA: Diagnosis not present

## 2022-05-09 DIAGNOSIS — F319 Bipolar disorder, unspecified: Secondary | ICD-10-CM | POA: Diagnosis present

## 2022-05-09 DIAGNOSIS — F17200 Nicotine dependence, unspecified, uncomplicated: Secondary | ICD-10-CM | POA: Diagnosis present

## 2022-05-09 DIAGNOSIS — J441 Chronic obstructive pulmonary disease with (acute) exacerbation: Secondary | ICD-10-CM | POA: Diagnosis present

## 2022-05-09 DIAGNOSIS — R109 Unspecified abdominal pain: Secondary | ICD-10-CM | POA: Diagnosis present

## 2022-05-09 DIAGNOSIS — I700 Atherosclerosis of aorta: Secondary | ICD-10-CM | POA: Diagnosis present

## 2022-05-09 DIAGNOSIS — G40909 Epilepsy, unspecified, not intractable, without status epilepticus: Secondary | ICD-10-CM | POA: Diagnosis present

## 2022-05-09 DIAGNOSIS — N184 Chronic kidney disease, stage 4 (severe): Secondary | ICD-10-CM | POA: Diagnosis present

## 2022-05-09 DIAGNOSIS — Z89511 Acquired absence of right leg below knee: Secondary | ICD-10-CM | POA: Diagnosis not present

## 2022-05-09 DIAGNOSIS — F17210 Nicotine dependence, cigarettes, uncomplicated: Secondary | ICD-10-CM | POA: Diagnosis present

## 2022-05-09 DIAGNOSIS — Z1152 Encounter for screening for COVID-19: Secondary | ICD-10-CM | POA: Diagnosis not present

## 2022-05-09 DIAGNOSIS — M069 Rheumatoid arthritis, unspecified: Secondary | ICD-10-CM | POA: Diagnosis present

## 2022-05-09 DIAGNOSIS — E669 Obesity, unspecified: Secondary | ICD-10-CM | POA: Diagnosis present

## 2022-05-09 DIAGNOSIS — I129 Hypertensive chronic kidney disease with stage 1 through stage 4 chronic kidney disease, or unspecified chronic kidney disease: Secondary | ICD-10-CM | POA: Diagnosis present

## 2022-05-09 DIAGNOSIS — F419 Anxiety disorder, unspecified: Secondary | ICD-10-CM | POA: Diagnosis present

## 2022-05-09 DIAGNOSIS — F429 Obsessive-compulsive disorder, unspecified: Secondary | ICD-10-CM | POA: Diagnosis present

## 2022-05-09 DIAGNOSIS — E66812 Obesity, class 2: Secondary | ICD-10-CM | POA: Diagnosis present

## 2022-05-09 DIAGNOSIS — J440 Chronic obstructive pulmonary disease with acute lower respiratory infection: Secondary | ICD-10-CM | POA: Diagnosis present

## 2022-05-09 DIAGNOSIS — E039 Hypothyroidism, unspecified: Secondary | ICD-10-CM | POA: Diagnosis present

## 2022-05-09 DIAGNOSIS — D649 Anemia, unspecified: Secondary | ICD-10-CM | POA: Diagnosis present

## 2022-05-09 DIAGNOSIS — D631 Anemia in chronic kidney disease: Secondary | ICD-10-CM | POA: Diagnosis present

## 2022-05-09 DIAGNOSIS — E1165 Type 2 diabetes mellitus with hyperglycemia: Secondary | ICD-10-CM | POA: Diagnosis present

## 2022-05-09 DIAGNOSIS — E869 Volume depletion, unspecified: Secondary | ICD-10-CM | POA: Diagnosis present

## 2022-05-09 DIAGNOSIS — J1000 Influenza due to other identified influenza virus with unspecified type of pneumonia: Secondary | ICD-10-CM | POA: Diagnosis present

## 2022-05-09 DIAGNOSIS — Z720 Tobacco use: Secondary | ICD-10-CM | POA: Diagnosis present

## 2022-05-09 DIAGNOSIS — E1122 Type 2 diabetes mellitus with diabetic chronic kidney disease: Secondary | ICD-10-CM | POA: Diagnosis present

## 2022-05-09 HISTORY — DX: Nicotine dependence, unspecified, uncomplicated: F17.200

## 2022-05-09 LAB — COMPREHENSIVE METABOLIC PANEL
ALT: 10 U/L (ref 0–44)
AST: 15 U/L (ref 15–41)
Albumin: 3.5 g/dL (ref 3.5–5.0)
Alkaline Phosphatase: 78 U/L (ref 38–126)
Anion gap: 12 (ref 5–15)
BUN: 37 mg/dL — ABNORMAL HIGH (ref 6–20)
CO2: 23 mmol/L (ref 22–32)
Calcium: 9.2 mg/dL (ref 8.9–10.3)
Chloride: 94 mmol/L — ABNORMAL LOW (ref 98–111)
Creatinine, Ser: 2.46 mg/dL — ABNORMAL HIGH (ref 0.44–1.00)
GFR, Estimated: 22 mL/min — ABNORMAL LOW (ref >60)
Glucose, Bld: 451 mg/dL — ABNORMAL HIGH (ref 70–99)
Potassium: 4.7 mmol/L (ref 3.5–5.1)
Sodium: 129 mmol/L — ABNORMAL LOW (ref 135–145)
Total Bilirubin: 0.9 mg/dL (ref 0.3–1.2)
Total Protein: 8.3 g/dL — ABNORMAL HIGH (ref 6.5–8.1)

## 2022-05-09 LAB — CBG MONITORING, ED
Glucose-Capillary: 440 mg/dL — ABNORMAL HIGH (ref 70–99)
Glucose-Capillary: 394 mg/dL — ABNORMAL HIGH (ref 70–99)
Glucose-Capillary: 368 mg/dL — ABNORMAL HIGH (ref 70–99)

## 2022-05-09 LAB — BLOOD GAS, VENOUS
Acid-Base Excess: 0.5 mmol/L (ref 0.0–2.0)
Bicarbonate: 27 mmol/L (ref 20.0–28.0)
O2 Saturation: 72.2 %
Patient temperature: 37
pCO2, Ven: 50 mmHg (ref 44–60)
pH, Ven: 7.34 (ref 7.25–7.43)
pO2, Ven: 45 mmHg (ref 32–45)

## 2022-05-09 LAB — LACTIC ACID, PLASMA
Lactic Acid, Venous: 2 mmol/L (ref 0.5–1.9)
Lactic Acid, Venous: 1.8 mmol/L (ref 0.5–1.9)

## 2022-05-09 LAB — CBC
HCT: 34.7 % — ABNORMAL LOW (ref 36.0–46.0)
Hemoglobin: 11.4 g/dL — ABNORMAL LOW (ref 12.0–15.0)
MCH: 30.6 pg (ref 26.0–34.0)
MCHC: 32.9 g/dL (ref 30.0–36.0)
MCV: 93 fL (ref 80.0–100.0)
Platelets: 183 10*3/uL (ref 150–400)
RBC: 3.73 MIL/uL — ABNORMAL LOW (ref 3.87–5.11)
RDW: 13 % (ref 11.5–15.5)
WBC: 23.4 10*3/uL — ABNORMAL HIGH (ref 4.0–10.5)
nRBC: 0 % (ref 0.0–0.2)

## 2022-05-09 LAB — LIPASE, BLOOD: Lipase: 54 U/L — ABNORMAL HIGH (ref 11–51)

## 2022-05-09 MED ORDER — ACETAMINOPHEN 325 MG PO TABS
650.0000 mg | ORAL_TABLET | Freq: Four times a day (QID) | ORAL | Status: DC | PRN
  Administered 2022-05-12: 650 mg via ORAL
  Filled 2022-05-09: qty 2

## 2022-05-09 MED ORDER — ATORVASTATIN CALCIUM 40 MG PO TABS
40.0000 mg | ORAL_TABLET | Freq: Every day | ORAL | Status: DC
  Administered 2022-05-09 – 2022-05-13 (×5): 40 mg via ORAL
  Filled 2022-05-09 (×5): qty 1

## 2022-05-09 MED ORDER — FAMOTIDINE 20 MG PO TABS
20.0000 mg | ORAL_TABLET | Freq: Every day | ORAL | Status: DC
  Administered 2022-05-09 – 2022-05-12 (×4): 20 mg via ORAL
  Filled 2022-05-09 (×4): qty 1

## 2022-05-09 MED ORDER — LURASIDONE HCL 20 MG PO TABS
20.0000 mg | ORAL_TABLET | Freq: Every day | ORAL | Status: DC
  Administered 2022-05-09 – 2022-05-12 (×4): 20 mg via ORAL
  Filled 2022-05-09 (×5): qty 1

## 2022-05-09 MED ORDER — LACTATED RINGERS IV BOLUS
1000.0000 mL | Freq: Once | INTRAVENOUS | Status: AC
  Administered 2022-05-09: 1000 mL via INTRAVENOUS

## 2022-05-09 MED ORDER — LACTATED RINGERS IV BOLUS (SEPSIS)
1000.0000 mL | Freq: Once | INTRAVENOUS | Status: AC
  Administered 2022-05-09: 1000 mL via INTRAVENOUS

## 2022-05-09 MED ORDER — TRAZODONE HCL 100 MG PO TABS: 50.0000 mg | ORAL_TABLET | Freq: Every day | ORAL | Status: DC

## 2022-05-09 MED ORDER — OXYCODONE-ACETAMINOPHEN 5-325 MG PO TABS
1.0000 | ORAL_TABLET | Freq: Once | ORAL | Status: AC
  Administered 2022-05-09: 1 via ORAL
  Filled 2022-05-09: qty 1

## 2022-05-09 MED ORDER — ONDANSETRON HCL 4 MG PO TABS: 4.0000 mg | ORAL_TABLET | Freq: Four times a day (QID) | ORAL | Status: DC | PRN

## 2022-05-09 MED ORDER — ACETAMINOPHEN 650 MG RE SUPP: 650.0000 mg | Freq: Four times a day (QID) | RECTAL | Status: DC | PRN

## 2022-05-09 MED ORDER — OXYCODONE HCL 5 MG PO TABS
5.0000 mg | ORAL_TABLET | Freq: Four times a day (QID) | ORAL | Status: DC | PRN
  Administered 2022-05-10: 5 mg via ORAL
  Filled 2022-05-09: qty 1

## 2022-05-09 MED ORDER — INSULIN ASPART 100 UNIT/ML IJ SOLN
0.0000 [IU] | Freq: Three times a day (TID) | INTRAMUSCULAR | Status: DC
  Administered 2022-05-10: 5 [IU] via SUBCUTANEOUS
  Administered 2022-05-10 (×2): 3 [IU] via SUBCUTANEOUS
  Administered 2022-05-11 (×3): 15 [IU] via SUBCUTANEOUS
  Administered 2022-05-12: 11 [IU] via SUBCUTANEOUS
  Administered 2022-05-12: 5 [IU] via SUBCUTANEOUS
  Filled 2022-05-09: qty 0.15

## 2022-05-09 MED ORDER — ONDANSETRON HCL 4 MG/2ML IJ SOLN
4.0000 mg | Freq: Once | INTRAMUSCULAR | Status: AC
  Administered 2022-05-09: 4 mg via INTRAVENOUS
  Filled 2022-05-09: qty 2

## 2022-05-09 MED ORDER — SODIUM CHLORIDE 0.9 % IV SOLN
500.0000 mg | INTRAVENOUS | Status: DC
  Administered 2022-05-09: 500 mg via INTRAVENOUS
  Filled 2022-05-09: qty 5

## 2022-05-09 MED ORDER — LACTATED RINGERS IV SOLN: INTRAVENOUS | Status: AC

## 2022-05-09 MED ORDER — IPRATROPIUM-ALBUTEROL 0.5-2.5 (3) MG/3ML IN SOLN
3.0000 mL | RESPIRATORY_TRACT | Status: DC | PRN
  Administered 2022-05-11: 3 mL via RESPIRATORY_TRACT

## 2022-05-09 MED ORDER — TRAZODONE HCL 50 MG PO TABS
50.0000 mg | ORAL_TABLET | Freq: Every evening | ORAL | Status: DC | PRN
  Administered 2022-05-09 – 2022-05-12 (×4): 50 mg via ORAL
  Filled 2022-05-09 (×4): qty 1

## 2022-05-09 MED ORDER — ONDANSETRON HCL 4 MG/2ML IJ SOLN: 4.0000 mg | Freq: Four times a day (QID) | INTRAMUSCULAR | Status: DC | PRN

## 2022-05-09 MED ORDER — INSULIN ASPART 100 UNIT/ML IJ SOLN
10.0000 [IU] | Freq: Once | INTRAMUSCULAR | Status: AC
  Administered 2022-05-09: 10 [IU] via SUBCUTANEOUS
  Filled 2022-05-09: qty 0.1

## 2022-05-09 MED ORDER — LEVOTHYROXINE SODIUM 50 MCG PO TABS
50.0000 ug | ORAL_TABLET | Freq: Every day | ORAL | Status: DC
  Administered 2022-05-10 – 2022-05-13 (×4): 50 ug via ORAL
  Filled 2022-05-09 (×5): qty 1

## 2022-05-09 MED ORDER — GLIPIZIDE 10 MG PO TABS
10.0000 mg | ORAL_TABLET | Freq: Two times a day (BID) | ORAL | Status: DC
  Administered 2022-05-10 – 2022-05-12 (×6): 10 mg via ORAL
  Filled 2022-05-09 (×8): qty 1

## 2022-05-09 MED ORDER — SODIUM CHLORIDE 0.9 % IV SOLN
2.0000 g | INTRAVENOUS | Status: AC
  Administered 2022-05-09 – 2022-05-13 (×5): 2 g via INTRAVENOUS
  Filled 2022-05-09 (×5): qty 20

## 2022-05-09 NOTE — Inpatient Diabetes Management (Signed)
Inpatient Diabetes Program Recommendations  AACE/ADA: New Consensus Statement on Inpatient Glycemic Control (2015)  Target Ranges:  Prepandial:   less than 140 mg/dL      Peak postprandial:   less than 180 mg/dL (1-2 hours)      Critically ill patients:  140 - 180 mg/dL   Lab Results  Component Value Date   GLUCAP 440 (H) 05/09/2022   HGBA1C 11.1 (H) 10/24/2014    Review of Glycemic Control  Diabetes history: DM2 Outpatient Diabetes medications: Jardiance 25 mg QD, glipizide 10 mg BID Current orders for Inpatient glycemic control: Novolog 0-15 TID with meals. Received 10 units of Novolog at 1545  HgbA1C pending  Inpatient Diabetes Program Recommendations:    Consider adding Semglee 15 units QD  Start Novolog 0-15 units TID with meals and 0-5 HS at 1700 today  Spoke with pt at bedside in ED. Pt states she ran out of glipizide about 3 weeks ago and doubled-up on Jardiance by taking 1 in am and 1 in pm. Moved to [place] in June 2023 and has not gotten PCP to manage her diabetes. Has not been checking blood sugars "because I'm not really eating much lately" because I feel bad. Stressed importance of obtaining PCP to manage her diabetes. Will order [facility] consult for assistance with getting PCP and medications.   Continue to follow.  Thank you. Lorenda Peck, RD, LDN, [facility] Inpatient Diabetes Coordinator [phone]

## 2022-05-09 NOTE — Sepsis Progress Note (Signed)
Sepsis protocol monitored by eLink 

## 2022-05-09 NOTE — Sepsis Progress Note (Signed)
Notified bedside nurse of need to draw repeat lactic acid. 

## 2022-05-09 NOTE — H&P (Signed)
History and Physical    PatientLoeta Potts PNT:[id number] DOB: 12-Oct-1965 DOA: 05/08/2022 DOS: the patient was seen and examined on 05/09/2022 PCP: Pcp, No  Patient coming from: Home  Chief Complaint:  Chief Complaint  Patient presents with   Emesis   HPI: Mallory Potts is a 56 y.o. female with medical history significant of anxiety, osteoarthritis, asthma, bipolar 1 disorder, COPD, depression, type 2 diabetes, epilepsy, hypertension, OCD, peripheral edema, seizure disorder, hypothyroidism, recently convalescent with influenza who is coming to the emergency department with complaints of vomiting 2 days ago, right flank/right lower chest wall pain.  She has been out of her glipizide for the past 3 weeks.  She received 300 mL normal saline and a DuoNeb with EMS.  She fever, chills, rhinorrhea, sore throat when she had influenza, but those symptoms have since then resolved.  Positive wheezing, but no hemoptysis.  No chest pain, palpitations, diaphoresis, PND, orthopnea or pitting edema of the lower extremities.  No abdominal pain, diarrhea, constipation, melena or hematochezia.  No dysuria, frequency or hematuria.  No polyuria, polydipsia, polyphagia or blurred vision.   ED course: Initial vital signs were temperature 98.9 F, pulse 95, respirations 16, BP 120/62 mmHg O2 sat 95% on room air.  The patient received 1000 mL of LR bolus, Zofran 4 mg IVP, oxycodone/acetaminophen 5/325 mg 1 tablet p.o., ceftriaxone 2 g IVPB and azithromycin 500 mg IVPB.  Lab work: Her CBC showed a white count of 23.4, hemoglobin 11.4 g/dL platelets 183.  Lipase was 54 units/L.  CMP with a glucose of 451, BUN 37 and creatinine 2.46 mg/dL.  Total protein was 8.3 g/deciliter.  The rest of the CMP measurements are normal after sodium correction.  Imaging: 2 view chest radiograph shows right lower lobe pneumonia.  Follow-up imaging recommended in 6 weeks.  CT abdomen/pelvis without contrast showing right lower lobe airspace  disease.  Nonspecific perinephric soft tissue stranding, left greater than right.  No evidence of urinary tract calculus or hydronephrosis.  Small amount of air in the urinary bladder.  There is an umbilical hernia containing only fat, minimally enlarged from remote prior CT.  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  Aortic atherosclerosis.   Review of Systems: As mentioned in the history of present illness. All other systems reviewed and are negative.  Past Medical History:  Diagnosis Date   Anxiety    Arthritis    Asthma    Bipolar 1 disorder (HCC)    COPD (chronic obstructive pulmonary disease) (HCC)    COPD (chronic obstructive pulmonary disease) (HCC)    Depression    Diabetes mellitus    Epilepsy ([place])    Hypertension    OCD (obsessive compulsive disorder)    Peripheral edema    Seizures ([place])    Thyroid disease    Past Surgical History:  Procedure Laterality Date   AMPUTATION Right 10/24/2014   Procedure: AMPUTATION RAY RIGHT SECOND TOE;  Surgeon: Netta Cedars, MD;  Location: [facility];  Service: Orthopedics;  Laterality: Right;   AMPUTATION Right 10/27/2014   Procedure: RIGHT BELOW KNEE AMPUTATION;  Surgeon: [name], MD;  Location: [facility];  Service: Orthopedics;  Laterality: Right;   KNEE ARTHROSCOPY     TONSILLECTOMY     Social History:  reports that she has been smoking cigarettes. She has a 2.50 pack-year smoking history. She does not have any smokeless tobacco history on file. She reports that  she does not drink alcohol and does not use drugs.  Allergies  Allergen Reactions   Lithium Shortness Of Breath   Milk-Related Compounds Nausea And Vomiting   Other     Bell peppers. Mouth sores.     Abilify [Aripiprazole] Rash   Aripiprazole Rash   Hydrocortisone Rash    Family History  Problem Relation Age of Onset   Asthma Other    Diabetes Other    Hypertension  Other     Prior to Admission medications   Medication Sig Start Date End Date Taking? Authorizing Provider  acetaminophen (TYLENOL) 500 MG tablet Take 1,000 mg by mouth every 6 (six) hours as needed for mild pain, moderate pain or headache.    [provider]  albuterol (PROVENTIL HFA;VENTOLIN HFA) 108 (90 BASE) MCG/ACT inhaler Inhale 2 puffs into the lungs every 6 (six) hours as needed for wheezing. Breathing 11/08/11   Melynda Ripple, MD  atorvastatin (LIPITOR) 40 MG tablet Take 40 mg by mouth daily.    [provider]  empagliflozin (JARDIANCE) 25 MG TABS tablet Take 25 mg by mouth daily.    [provider]  famotidine (PEPCID) 20 MG tablet Take 20 mg by mouth 2 (two) times daily.    [provider]  fluticasone (FLONASE) 50 MCG/ACT nasal spray Place 1 spray into both nostrils daily.    [provider]  fluticasone-salmeterol (ADVAIR) 250-50 MCG/ACT AEPB Inhale 1 puff into the lungs in the morning and at bedtime.    [provider]  glipiZIDE (GLUCOTROL) 10 MG tablet Take 10 mg by mouth 2 (two) times daily with a meal.    [provider]  hydrochlorothiazide (HYDRODIURIL) 12.5 MG tablet Take 12.5 mg by mouth daily.    [provider]  hydrOXYzine (ATARAX) 25 MG tablet Take 1 tablet (25 mg total) by mouth 3 (three) times daily. 03/13/22   Salley Slaughter, NP  levothyroxine (SYNTHROID, LEVOTHROID) 50 MCG tablet Take 50 mcg by mouth daily before breakfast.    [provider]  lurasidone (LATUDA) 20 MG TABS tablet Take 1 tablet (20 mg total) by mouth at bedtime. 03/13/22   Salley Slaughter, NP  traZODone (DESYREL) 50 MG tablet Take 1-3 tablets (50-150 mg total) by mouth at bedtime. 03/13/22   Salley Slaughter, NP  VITAMIN D PO Take 1 tablet by mouth daily.    [provider]  spironolactone (ALDACTONE) 25 MG tablet Take 1 tablet (25 mg total) by mouth daily. 06/19/11 10/26/11  Rolland Porter, MD     Physical Exam: Vitals:   05/08/22 2000 05/08/22 2004 05/08/22 2222 05/09/22 0732  BP: 128/62  123/72 131/85  Pulse: 95  96 92  Resp: 16  18 18   Temp: 98.9 F (37.2 C)  98.8 F (37.1 C) 98.9 F (37.2 C)  TempSrc: Oral  Oral Oral  SpO2: 95%  96% 93%  Weight:  96.2 kg    Height:  5\' 3"  (1.6 m)     Physical Exam Vitals and nursing note reviewed.  Constitutional:      General: She is awake. She is not in acute distress.    Appearance: She is obese. She is ill-appearing.  HENT:     Head: Normocephalic.     Nose: No rhinorrhea.     Mouth/Throat:     Mouth: Mucous membranes are dry.  Eyes:     General: No scleral icterus.    Pupils: Pupils are equal, round, and reactive to light.  Neck:     Vascular: No JVD.  Cardiovascular:     Rate and Rhythm: Normal rate and regular rhythm.     Heart sounds: S1 normal and S2 normal.  Pulmonary:     Effort: Pulmonary effort is normal. No accessory muscle usage.     Breath sounds: Examination of the right-lower field reveals decreased breath sounds and rales. Decreased breath sounds, wheezing, rhonchi and rales present.  Abdominal:     General: Bowel sounds are normal. There is no distension.     Palpations: Abdomen is soft.     Tenderness: There is no abdominal tenderness. There is right CVA tenderness. There is no left CVA tenderness, guarding or rebound.  Musculoskeletal:     Cervical back: Neck supple.     Right lower leg: No edema.     Left lower leg: No edema.     Left Lower Extremity: Left leg is amputated below knee.  Skin:    General: Skin is warm and dry.  Neurological:     General: No focal deficit present.     Mental Status: She is alert and oriented to person, place, and time.  Psychiatric:        Mood and Affect: Mood normal.        Behavior: Behavior normal. Behavior is cooperative.   Data Reviewed:  Results are pending, will review when available.  Assessment and Plan: Principal Problem:   Right lower lobe  pneumonia Post influenza and superimposed on COPD Admit to MedSurg/inpatient. Continue supplemental oxygen. Scheduled and as needed bronchodilators. Continue ceftriaxone 2 g IVPB daily. Continue azithromycin 500 mg IVPB daily. Oxycodone as needed for pleuritic pain. Check strep pneumoniae urinary antigen. Check sputum Gram stain, culture and sensitivity. Follow-up blood culture and sensitivity. Follow-up CBC and chemistry in the morning.  Active Problems:   Bipolar 1 disorder (HCC)   PTSD (post-traumatic stress disorder) Continue lurasidone 20 mg p.o. bedtime. Continue trazodone at bedtime. Follow-up with behavioral health as an outpatient.    Essential hypertension Hold hydrochlorothiazide for now. Monitor blood pressure.    Stage 3b chronic kidney disease ([place]) Patient is volume depleted. Does not meet criteria for AKI Continue IV fluids. Avoid nephrotoxins. Hold hydrochlorothiazide. Monitor electrolytes and GFR. Follow-up with neurology as an outpatient.    Normocytic anemia Secondary to chronic renal disease. Monitor hematocrit and hemoglobin. Transfuse as needed.    Hypothyroidism Continue levothyroxine 50 mcg p.o. daily.    Class II obesity  BMI 37.55 kg/m. Lifestyle modifications. Follow-up with PCP as an outpatient.    Aortic atherosclerosis ([place])  Will benefit from statin therapy. Smoking cessation advised. Follow-up with primary care provider.    Tobacco use  Smoking cessation advised. Nicotine replacement therapy as needed offered. The patient declined nicotine patches for now. I will order as needed in case she changes her mind.    Right below-knee amputation Supportive care.    Advance Care Planning:   Code Status: Full Code   Consults:   Family Communication:   Severity of Illness: The appropriate patient status for this patient is INPATIENT. Inpatient status is judged to be reasonable and necessary in order to provide the required  intensity of service to ensure the patient's safety. The patient's presenting symptoms, physical exam findings, and initial radiographic and laboratory data in the context of their chronic comorbidities is felt to place them at high risk for further clinical deterioration. Furthermore, it is not anticipated that the patient will be medically stable for discharge from  the hospital within 2 midnights of admission.   * I certify that at the point of admission it is my clinical judgment that the patient will require inpatient hospital care spanning beyond 2 midnights from the point of admission due to high intensity of service, high risk for further deterioration and high frequency of surveillance required.*  Author: Reubin Milan, MD 05/09/2022 3:39 PM  For on call review [email or web address].   This document was prepared using Dragon voice recognition software and may contain some unintended transcription errors.

## 2022-05-09 NOTE — ED Provider Notes (Signed)
[facility] DEPT Provider Note  CSN: [id number] Arrival date & time: 05/08/22 1936  Chief Complaint(s) Emesis  HPI Mallory Potts is a 55 y.o. female with history of COPD, bipolar disorder, epilepsy, hypertension, asthma presenting to the emergency department with flank/side pain.  Patient reports pain began 2 days ago, pleuritic.  She denies shortness of breath, reports mild cough which is nonproductive.  She reports that she has had nausea and vomiting over the past 2 days with the symptoms.  Denies any similar episode in the past.  She has a right leg amputation but denies any redness or pain to the left leg, recent travel or surgeries, reports some mild chronic swelling intermittently which is unchanged.  No fevers or chills.  No sore throat, runny nose.  Symptoms moderate.   Past Medical History Past Medical History:  Diagnosis Date   Anxiety    Arthritis    Asthma    Bipolar 1 disorder (HCC)    COPD (chronic obstructive pulmonary disease) (HCC)    COPD (chronic obstructive pulmonary disease) (HCC)    Depression    Diabetes mellitus    Epilepsy ([place])    Hypertension    OCD (obsessive compulsive disorder)    Peripheral edema    Seizures (HCC)    Thyroid disease    Patient Active Problem List   Diagnosis Date Noted   Right lower lobe pneumonia 05/09/2022   Generalized anxiety disorder 03/13/2022   PTSD (post-traumatic stress disorder) 03/13/2022   Long term (current) use of non-steroidal anti-inflammatories ([place]) 08/08/2021   Proteinuria 08/08/2021   Microalbuminuria 06/29/2021   Stage 3b chronic kidney disease ([place]) 06/29/2021   Chronic kidney disease, stage 4 (severe) ([place]) 08/20/2020   Hypothyroidism 08/20/2020   Insomnia 08/20/2020   Morbid obesity ([place]) 08/20/2020   Osteoarthritis 08/20/2020   Peripheral neuropathic pain 08/20/2020   Rheumatoid arthritis ([place]) 08/20/2020   Seizure ([place]) 11/13/2014   Acute renal failure ([place])  10/29/2014   Cellulitis of foot 10/24/2014   Hyponatremia 10/24/2014   Hypokalemia 10/24/2014   Diabetic infection of right foot ([place]) 10/24/2014   DM (diabetes mellitus), type 2, uncontrolled    Epilepsy (HCC)    COPD (chronic obstructive pulmonary disease) (HCC)    Bipolar 1 disorder (HCC)    OCD (obsessive compulsive disorder)    Hypertension    COLD (chronic obstructive lung disease) ([place])    Essential hypertension    Other epilepsy without status epilepticus, not intractable ([place])    Home Medication(s) Prior to Admission medications   Medication Sig Start Date End Date Taking? Authorizing Provider  acetaminophen (TYLENOL) 500 MG tablet Take 1,000 mg by mouth every 6 (six) hours as needed for mild pain, moderate pain or headache.    [provider]  albuterol (PROVENTIL HFA;VENTOLIN HFA) 108 (90 BASE) MCG/ACT inhaler Inhale 2 puffs into the lungs every 6 (six) hours as needed for wheezing. Breathing 11/08/11   Melynda Ripple, MD  atorvastatin (LIPITOR) 40 MG tablet Take 40 mg by mouth daily.    [provider]  empagliflozin (JARDIANCE) 25 MG TABS tablet Take 25 mg by mouth daily.    [provider]  famotidine (PEPCID) 20 MG tablet Take 20 mg by mouth 2 (two) times daily.    [provider]  fluticasone (FLONASE) 50 MCG/ACT nasal spray Place 1 spray into both nostrils daily.    [provider]  fluticasone-salmeterol (ADVAIR) 250-50 MCG/ACT AEPB Inhale 1 puff into the lungs in the morning and  at bedtime.    [provider]  glipiZIDE (GLUCOTROL) 10 MG tablet Take 10 mg by mouth 2 (two) times daily with a meal.    [provider]  hydrochlorothiazide (HYDRODIURIL) 12.5 MG tablet Take 12.5 mg by mouth daily.    [provider]  hydrOXYzine (ATARAX) 25 MG tablet Take 1 tablet (25 mg total) by mouth 3 (three) times daily. 03/13/22   Salley Slaughter, NP  levothyroxine (SYNTHROID, LEVOTHROID) 50 MCG tablet Take  50 mcg by mouth daily before breakfast.    [provider]  lurasidone (LATUDA) 20 MG TABS tablet Take 1 tablet (20 mg total) by mouth at bedtime. 03/13/22   Salley Slaughter, NP  traZODone (DESYREL) 50 MG tablet Take 1-3 tablets (50-150 mg total) by mouth at bedtime. 03/13/22   Salley Slaughter, NP  VITAMIN D PO Take 1 tablet by mouth daily.    [provider]  spironolactone (ALDACTONE) 25 MG tablet Take 1 tablet (25 mg total) by mouth daily. 06/19/11 10/26/11  Rolland Porter, MD                                                                                                                                    Past Surgical History Past Surgical History:  Procedure Laterality Date   AMPUTATION Right 10/24/2014   Procedure: AMPUTATION RAY RIGHT SECOND TOE;  Surgeon: Netta Cedars, MD;  Location: [facility];  Service: Orthopedics;  Laterality: Right;   AMPUTATION Right 10/27/2014   Procedure: RIGHT BELOW KNEE AMPUTATION;  Surgeon: [name], MD;  Location: [facility];  Service: Orthopedics;  Laterality: Right;   KNEE ARTHROSCOPY     TONSILLECTOMY     Family History Family History  Problem Relation Age of Onset   Asthma Other    Diabetes Other    Hypertension Other     Social History Social History   Tobacco Use   Smoking status: Every Day    Packs/day: 0.50    Years: 5.00    Total pack years: 2.50    Types: Cigarettes  Substance Use Topics   Alcohol use: No    Comment: OCASSIONALLY   Drug use: No   Allergies Lithium, Milk-related compounds, Other, Abilify [aripiprazole], Aripiprazole, and Hydrocortisone  Review of Systems Review of Systems  All other systems reviewed and are negative.   Physical Exam Vital Signs  I have reviewed the triage vital signs BP 131/85   Pulse 92   Temp 98.9 F (37.2 C) (Oral)   Resp 18   Ht 5\' 3"  (1.6 m)   Wt 96.2 kg   SpO2 93%   BMI 37.55 kg/m  Physical Exam Vitals and nursing note reviewed.  Constitutional:      General:  She is not in acute distress.    Appearance: She is well-developed. She is obese.  HENT:     Head: Normocephalic and atraumatic.  Mouth/Throat:     Mouth: Mucous membranes are moist.  Eyes:     Pupils: Pupils are equal, round, and reactive to light.  Cardiovascular:     Rate and Rhythm: Normal rate and regular rhythm.     Heart sounds: No murmur heard. Pulmonary:     Effort: Pulmonary effort is normal. No respiratory distress.     Breath sounds: Rales (right lower lobe) present.  Abdominal:     General: Abdomen is flat.     Palpations: Abdomen is soft.     Tenderness: There is no abdominal tenderness.  Musculoskeletal:        General: No tenderness.     Right lower leg: No edema.     Left lower leg: No edema.  Skin:    General: Skin is warm and dry.  Neurological:     General: No focal deficit present.     Mental Status: She is alert. Mental status is at baseline.  Psychiatric:        Mood and Affect: Mood normal.        Behavior: Behavior normal.     ED Results and Treatments Labs (all labs ordered are listed, but only abnormal results are displayed) Labs Reviewed  LIPASE, BLOOD - Abnormal; Notable for the following components:      Result Value   Lipase 54 (*)    All other components within normal limits  COMPREHENSIVE METABOLIC PANEL - Abnormal; Notable for the following components:   Sodium 129 (*)    Chloride 94 (*)    Glucose, Bld 451 (*)    BUN 37 (*)    Creatinine, Ser 2.46 (*)    Total Protein 8.3 (*)    GFR, Estimated 22 (*)    All other components within normal limits  CBC - Abnormal; Notable for the following components:   WBC 23.4 (*)    RBC 3.73 (*)    Hemoglobin 11.4 (*)    HCT 34.7 (*)    All other components within normal limits  CBG MONITORING, ED - Abnormal; Notable for the following components:   Glucose-Capillary 530 (*)    All other components within normal limits  CBG MONITORING, ED - Abnormal; Notable for the following components:    Glucose-Capillary 440 (*)    All other components within normal limits  CULTURE, BLOOD (SINGLE)  EXPECTORATED SPUTUM ASSESSMENT W GRAM STAIN, RFLX TO RESP C  URINALYSIS, ROUTINE W REFLEX MICROSCOPIC  BLOOD GAS, VENOUS  LACTIC ACID, PLASMA  LACTIC ACID, PLASMA  STREP PNEUMONIAE URINARY ANTIGEN  HEMOGLOBIN A1C                                                                                                                          Radiology DG Chest 2 View  Result Date: 05/09/2022 CLINICAL DATA:  Cough. Right flank and rib pain. Vomited 3 days ago. EXAM: CHEST - 2 VIEW COMPARISON:  Chest two views 06/19/2011; CT abdomen and pelvis 05/09/2022  FINDINGS: Cardiac silhouette and mediastinal contours are within normal limits. There is a density within the far inferior right lung abutting the right hemidiaphragm. The left lung appears clear. No pleural effusion or pneumothorax. Moderate multilevel degenerative disc changes of the thoracic spine. Densities in the patient's clothing overlie the left-greater-than-right hemi thoraces. IMPRESSION: Right lower lobe pneumonia. This was also better seen on today's CT of the abdomen and pelvis. Recommend follow-up radiographs 6 weeks after treatment to ensure resolution. Electronically Signed   By: Yvonne Kendall M.D.   On: 05/09/2022 13:53   CT ABDOMEN PELVIS WO CONTRAST  Result Date: 05/09/2022 CLINICAL DATA:  Abdominal pain, acute nonlocalized. Vomiting for 2 days with right flank pain. Flu last week. EXAM: CT ABDOMEN AND PELVIS WITHOUT CONTRAST TECHNIQUE: Multidetector CT imaging of the abdomen and pelvis was performed following the standard protocol without IV contrast. RADIATION DOSE REDUCTION: This exam was performed according to the departmental dose-optimization program which includes automated exposure control, adjustment of the mA and/or kV according to patient size and/or use of iterative reconstruction technique. COMPARISON:  Abdominopelvic CT  10/29/2012. FINDINGS: Lower chest: Mild aortic atherosclerosis and a small hiatal hernia. There is dependent right lower lobe airspace disease suspicious for pneumonia. There is a small right lower lobe calcified granuloma. The left lung base appears clear. No significant pleural or pericardial effusion. Hepatobiliary: Suspected mild hepatic steatosis without evidence of focal abnormality on noncontrast imaging. The gallbladder is incompletely distended. No evidence of gallstones, gallbladder wall thickening or biliary dilatation. Pancreas: Unremarkable. No pancreatic ductal dilatation or surrounding inflammatory changes. Spleen: Normal in size without focal abnormality. Adrenals/Urinary Tract: Both adrenal glands appear normal. Increased perinephric soft tissue stranding, left greater than right. No evidence of focal perinephric fluid collection, urinary tract calculus or hydronephrosis. Mild bladder distension. There is a small amount of air in the urinary bladder which may be iatrogenic. No other bladder abnormality identified. Stomach/Bowel: No enteric contrast administered. The stomach appears unremarkable for its degree of distension. No evidence of bowel wall thickening, distention or surrounding inflammatory change. The appendix appears normal. Mild sigmoid diverticulosis. Vascular/Lymphatic: There are no enlarged abdominal or pelvic lymph nodes. Aortic and branch vessel atherosclerosis without evidence of aneurysm. Reproductive: The uterus and ovaries appear normal. No adnexal mass. Other: 5.5 cm umbilical hernia containing only fat. No herniated bowel, ascites or free air. Musculoskeletal: No acute or significant osseous findings. 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 IMPRESSION: 1. Right lower lobe airspace disease suspicious for pneumonia. Recommend chest radiographic follow-up to radiographic  resolution. 2. Nonspecific perinephric soft tissue stranding, left greater than right. No evidence of urinary tract calculus or hydronephrosis. Small amount of air in the urinary bladder, likely iatrogenic. 3. Umbilical hernia containing only fat, minimally enlarged from remote prior CT. 4. 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 5.  Aortic Atherosclerosis (ICD10-I70.0). Electronically Signed   By: Richardean Sale M.D.   On: 05/09/2022 10:58    Pertinent labs & imaging results that were available during my care of the patient were reviewed by me and considered in my medical decision making (see MDM for details).  Medications Ordered in ED Medications  lactated ringers infusion ( Intravenous New Bag/Given 05/09/22 1537)  lactated ringers bolus 1,000 mL (1,000 mLs Intravenous New Bag/Given 05/09/22 1538)  cefTRIAXone (ROCEPHIN) 2 g in sodium chloride 0.9 % 100 mL IVPB (2  g Intravenous New Bag/Given 05/09/22 1532)  azithromycin (ZITHROMAX) 500 mg in sodium chloride 0.9 % 250 mL IVPB (has no administration in time range)  ondansetron (ZOFRAN) injection 4 mg (has no administration in time range)  oxyCODONE-acetaminophen (PERCOCET/ROXICET) 5-325 MG per tablet 1 tablet (has no administration in time range)  insulin aspart (novoLOG) injection 10 Units (has no administration in time range)  acetaminophen (TYLENOL) tablet 650 mg (has no administration in time range)    Or  acetaminophen (TYLENOL) suppository 650 mg (has no administration in time range)  ondansetron (ZOFRAN) tablet 4 mg (has no administration in time range)    Or  ondansetron (ZOFRAN) injection 4 mg (has no administration in time range)  ipratropium-albuterol (DUONEB) 0.5-2.5 (3) MG/3ML nebulizer solution 3 mL (has no administration in time range)  insulin aspart (novoLOG) injection 0-15 Units (has no administration in time range)  lactated  ringers bolus 1,000 mL (has no administration in time range)                                                                                                                                     Procedures Procedures  (including critical care time)  Medical Decision Making / ED Course   MDM:  56 year old female presenting to the emergency department with flank pain.  On exam, patient well-appearing, does have right-sided crackles in the right lower lobe.  Vital signs reassuring with no fever, no hypoxia on room air.  Suspect pneumonia.  CT scan without evidence of nephrolithiasis, does show some perinephric left-sided stranding but this is not where her symptoms are.  Urinalysis pending.  CT scan did reveal a right lower lobe pneumonia which is confirmed on chest x-ray.  This would go along with the patient's pleuritic pain and elevated WBC count.  Lower concern for pulmonary embolism without left leg redness, swelling or signs of DVT, lack of hypoxia, tachycardia or tachypnea.  Blood cultures obtained given leukocytosis and comorbidities.  Will treat for community-acquired pneumonia.  Discussed with the hospitalist Dr. Olevia Bowens will admit the patient for further management.  Clinical Course as of 05/09/22 1543  Tue May 09, 2022  0424 WBC(!): 23.4 [MR]  0424 Glucose-Capillary(!!): 530 [MR]    Clinical Course User Index [MR] Redwine, Madison A, PA-C     Additional history obtained: -Additional history obtained from ems -External records from outside source obtained and reviewed including: Chart review including previous notes, labs, imaging, consultation notes including ED visit 03/10/2022   Lab Tests: -I ordered, reviewed, and interpreted labs.   The pertinent results include:   Labs Reviewed  LIPASE, BLOOD - Abnormal; Notable for the following components:      Result Value   Lipase 54 (*)    All other components within normal limits  COMPREHENSIVE METABOLIC PANEL - Abnormal;  Notable for the following components:   Sodium 129 (*)    Chloride 94 (*)  Glucose, Bld 451 (*)    BUN 37 (*)    Creatinine, Ser 2.46 (*)    Total Protein 8.3 (*)    GFR, Estimated 22 (*)    All other components within normal limits  CBC - Abnormal; Notable for the following components:   WBC 23.4 (*)    RBC 3.73 (*)    Hemoglobin 11.4 (*)    HCT 34.7 (*)    All other components within normal limits  CBG MONITORING, ED - Abnormal; Notable for the following components:   Glucose-Capillary 530 (*)    All other components within normal limits  CBG MONITORING, ED - Abnormal; Notable for the following components:   Glucose-Capillary 440 (*)    All other components within normal limits  CULTURE, BLOOD (SINGLE)  EXPECTORATED SPUTUM ASSESSMENT W GRAM STAIN, RFLX TO RESP C  URINALYSIS, ROUTINE W REFLEX MICROSCOPIC  BLOOD GAS, VENOUS  LACTIC ACID, PLASMA  LACTIC ACID, PLASMA  STREP PNEUMONIAE URINARY ANTIGEN  HEMOGLOBIN A1C    Notable for minimally elevated lipase, hyponatremia, AKI on CKD, leukocytosis, mild anemia, hyperglycemia without evidence of DKA  EKG   EKG Interpretation  Date/Time:    Ventricular Rate:    PR Interval:    QRS Duration:   QT Interval:    QTC Calculation:   R Axis:     Text Interpretation:           Imaging Studies ordered: I ordered imaging studies including CT abdomen pelvis, chest x-ray On my interpretation imaging demonstrates right lower lobe pneumonia I independently visualized and interpreted imaging. I agree with the radiologist interpretation   Medicines ordered and prescription drug management: Meds ordered this encounter  Medications   lactated ringers infusion   lactated ringers bolus 1,000 mL    Order Specific Question:   Reason 30 mL/kg dose is not being ordered    Answer:   Non-Septic Shock Clinical Presentation   cefTRIAXone (ROCEPHIN) 2 g in sodium chloride 0.9 % 100 mL IVPB    Order Specific Question:   Antibiotic  Indication:    Answer:   CAP   azithromycin (ZITHROMAX) 500 mg in sodium chloride 0.9 % 250 mL IVPB    Order Specific Question:   Antibiotic Indication:    Answer:   CAP   ondansetron (ZOFRAN) injection 4 mg   oxyCODONE-acetaminophen (PERCOCET/ROXICET) 5-325 MG per tablet 1 tablet   insulin aspart (novoLOG) injection 10 Units   OR Linked Order Group    acetaminophen (TYLENOL) tablet 650 mg    acetaminophen (TYLENOL) suppository 650 mg   OR Linked Order Group    ondansetron (ZOFRAN) tablet 4 mg    ondansetron (ZOFRAN) injection 4 mg   ipratropium-albuterol (DUONEB) 0.5-2.5 (3) MG/3ML nebulizer solution 3 mL   insulin aspart (novoLOG) injection 0-15 Units    Order Specific Question:   Correction coverage:    Answer:   Moderate (average weight, post-op)    Order Specific Question:   CBG < 70:    Answer:   Implement Hypoglycemia Standing Orders and refer to Hypoglycemia Standing Orders sidebar report    Order Specific Question:   CBG 70 - 120:    Answer:   0 units    Order Specific Question:   CBG 121 - 150:    Answer:   2 units    Order Specific Question:   CBG 151 - 200:    Answer:   3 units    Order Specific Question:   CBG 201 -  250:    Answer:   5 units    Order Specific Question:   CBG 251 - 300:    Answer:   8 units    Order Specific Question:   CBG 301 - 350:    Answer:   11 units    Order Specific Question:   CBG 351 - 400:    Answer:   15 units    Order Specific Question:   CBG > 400    Answer:   call MD and obtain STAT lab verification   lactated ringers bolus 1,000 mL    -I have reviewed the patients home medicines and have made adjustments as needed   Consultations Obtained: I requested consultation with the hospitalist,  and discussed lab and imaging findings as well as pertinent plan - they recommend: Admission   Cardiac Monitoring: The patient was maintained on a cardiac monitor.  I personally viewed and interpreted the cardiac monitored which showed an  underlying rhythm of: Normal sinus rhythm  Social Determinants of Health:  Diagnosis or treatment significantly limited by social determinants of health: current smoker and obesity   Reevaluation: After the interventions noted above, I reevaluated the patient and found that they have improved  Co morbidities that complicate the patient evaluation  Past Medical History:  Diagnosis Date   Anxiety    Arthritis    Asthma    Bipolar 1 disorder (HCC)    COPD (chronic obstructive pulmonary disease) (HCC)    COPD (chronic obstructive pulmonary disease) (HCC)    Depression    Diabetes mellitus    Epilepsy ([place])    Hypertension    OCD (obsessive compulsive disorder)    Peripheral edema    Seizures ([place])    Thyroid disease       Dispostion: Disposition decision including need for hospitalization was considered, and patient admitted to the hospital.    Final Clinical Impression(s) / ED Diagnoses Final diagnoses:  Pneumonia of right lower lobe due to infectious organism     This chart was dictated using voice recognition software.  Despite best efforts to proofread,  errors can occur which can change the documentation meaning.    Cristie Hem, MD 05/09/22 [phone]

## 2022-05-09 NOTE — Sepsis Progress Note (Signed)
Per charting antibiotics given before blood cultures drawn at 1540

## 2022-05-10 DIAGNOSIS — J101 Influenza due to other identified influenza virus with other respiratory manifestations: Secondary | ICD-10-CM

## 2022-05-10 DIAGNOSIS — J181 Lobar pneumonia, unspecified organism: Secondary | ICD-10-CM

## 2022-05-10 DIAGNOSIS — J441 Chronic obstructive pulmonary disease with (acute) exacerbation: Secondary | ICD-10-CM

## 2022-05-10 LAB — CBC
HCT: 29.5 % — ABNORMAL LOW (ref 36.0–46.0)
Hemoglobin: 9.5 g/dL — ABNORMAL LOW (ref 12.0–15.0)
MCH: 30 pg (ref 26.0–34.0)
MCHC: 32.2 g/dL (ref 30.0–36.0)
MCV: 93.1 fL (ref 80.0–100.0)
Platelets: 157 10*3/uL (ref 150–400)
RBC: 3.17 MIL/uL — ABNORMAL LOW (ref 3.87–5.11)
RDW: 13.1 % (ref 11.5–15.5)
WBC: 16.1 10*3/uL — ABNORMAL HIGH (ref 4.0–10.5)
nRBC: 0 % (ref 0.0–0.2)

## 2022-05-10 LAB — URINALYSIS, ROUTINE W REFLEX MICROSCOPIC
Bacteria, UA: NONE SEEN
Bilirubin Urine: NEGATIVE
Glucose, UA: >=500 mg/dL — AB
Ketones, ur: NEGATIVE mg/dL
Nitrite: NEGATIVE
Protein, ur: 100 mg/dL — AB
Specific Gravity, Urine: 1.024 (ref 1.005–1.030)
pH: 6 (ref 5.0–8.0)

## 2022-05-10 LAB — COMPREHENSIVE METABOLIC PANEL
ALT: 9 U/L (ref 0–44)
AST: 9 U/L — ABNORMAL LOW (ref 15–41)
Albumin: 2.5 g/dL — ABNORMAL LOW (ref 3.5–5.0)
Alkaline Phosphatase: 54 U/L (ref 38–126)
Anion gap: 10 (ref 5–15)
BUN: 40 mg/dL — ABNORMAL HIGH (ref 6–20)
CO2: 24 mmol/L (ref 22–32)
Calcium: 8.5 mg/dL — ABNORMAL LOW (ref 8.9–10.3)
Chloride: 99 mmol/L (ref 98–111)
Creatinine, Ser: 2.09 mg/dL — ABNORMAL HIGH (ref 0.44–1.00)
GFR, Estimated: 27 mL/min — ABNORMAL LOW (ref >60)
Glucose, Bld: 249 mg/dL — ABNORMAL HIGH (ref 70–99)
Potassium: 3.7 mmol/L (ref 3.5–5.1)
Sodium: 133 mmol/L — ABNORMAL LOW (ref 135–145)
Total Bilirubin: 0.3 mg/dL (ref 0.3–1.2)
Total Protein: 6.8 g/dL (ref 6.5–8.1)

## 2022-05-10 LAB — RESP PANEL BY RT-PCR (RSV, FLU A&B, COVID)  RVPGX2
Influenza A by PCR: POSITIVE — AB
Influenza B by PCR: NEGATIVE
Resp Syncytial Virus by PCR: NEGATIVE
SARS Coronavirus 2 by RT PCR: NEGATIVE

## 2022-05-10 LAB — GLUCOSE, CAPILLARY: Glucose-Capillary: 378 mg/dL — ABNORMAL HIGH (ref 70–99)

## 2022-05-10 LAB — STREP PNEUMONIAE URINARY ANTIGEN: Strep Pneumo Urinary Antigen: NEGATIVE

## 2022-05-10 LAB — EXPECTORATED SPUTUM ASSESSMENT W GRAM STAIN, RFLX TO RESP C

## 2022-05-10 LAB — CBG MONITORING, ED
Glucose-Capillary: 236 mg/dL — ABNORMAL HIGH (ref 70–99)
Glucose-Capillary: 185 mg/dL — ABNORMAL HIGH (ref 70–99)

## 2022-05-10 LAB — HIV ANTIBODY (ROUTINE TESTING W REFLEX): HIV Screen 4th Generation wRfx: NONREACTIVE

## 2022-05-10 LAB — HEMOGLOBIN A1C
Hgb A1c MFr Bld: 11.2 % — ABNORMAL HIGH (ref 4.8–5.6)
Mean Plasma Glucose: 275 mg/dL

## 2022-05-10 MED ORDER — METHYLPREDNISOLONE SODIUM SUCC 125 MG IJ SOLR
60.0000 mg | Freq: Once | INTRAMUSCULAR | Status: AC
  Administered 2022-05-10: 60 mg via INTRAVENOUS
  Filled 2022-05-10: qty 2

## 2022-05-10 MED ORDER — INSULIN GLARGINE-YFGN 100 UNIT/ML ~~LOC~~ SOLN
10.0000 [IU] | Freq: Every day | SUBCUTANEOUS | Status: DC
  Filled 2022-05-10: qty 0.1

## 2022-05-10 MED ORDER — INSULIN GLARGINE-YFGN 100 UNIT/ML ~~LOC~~ SOLN
20.0000 [IU] | Freq: Every day | SUBCUTANEOUS | Status: DC
  Administered 2022-05-10: 20 [IU] via SUBCUTANEOUS
  Filled 2022-05-10: qty 0.2

## 2022-05-10 MED ORDER — OSELTAMIVIR PHOSPHATE 30 MG PO CAPS
30.0000 mg | ORAL_CAPSULE | Freq: Two times a day (BID) | ORAL | Status: DC
  Administered 2022-05-11 – 2022-05-13 (×5): 30 mg via ORAL
  Filled 2022-05-10 (×5): qty 1

## 2022-05-10 MED ORDER — AZITHROMYCIN 250 MG PO TABS
500.0000 mg | ORAL_TABLET | ORAL | Status: AC
  Administered 2022-05-10 – 2022-05-13 (×4): 500 mg via ORAL
  Filled 2022-05-10 (×4): qty 2

## 2022-05-10 MED ORDER — OSELTAMIVIR PHOSPHATE 75 MG PO CAPS
75.0000 mg | ORAL_CAPSULE | Freq: Once | ORAL | Status: AC
  Administered 2022-05-10: 75 mg via ORAL
  Filled 2022-05-10: qty 1

## 2022-05-10 MED ORDER — IPRATROPIUM-ALBUTEROL 0.5-2.5 (3) MG/3ML IN SOLN
3.0000 mL | Freq: Three times a day (TID) | RESPIRATORY_TRACT | Status: DC
  Filled 2022-05-10 (×2): qty 3

## 2022-05-10 MED ORDER — INSULIN ASPART 100 UNIT/ML IJ SOLN
3.0000 [IU] | Freq: Three times a day (TID) | INTRAMUSCULAR | Status: DC
  Administered 2022-05-11: 3 [IU] via SUBCUTANEOUS

## 2022-05-10 MED ORDER — PREDNISONE 50 MG PO TABS
50.0000 mg | ORAL_TABLET | Freq: Every day | ORAL | Status: DC
  Administered 2022-05-11: 50 mg via ORAL
  Filled 2022-05-10: qty 1

## 2022-05-10 MED ORDER — GUAIFENESIN ER 600 MG PO TB12
600.0000 mg | ORAL_TABLET | Freq: Two times a day (BID) | ORAL | Status: DC
  Administered 2022-05-10 – 2022-05-13 (×6): 600 mg via ORAL
  Filled 2022-05-10 (×6): qty 1

## 2022-05-10 NOTE — Progress Notes (Signed)
PROGRESS NOTE    Mallory Potts  [id number]:[id number] DOB: 23-Jun-1965 DOA: 05/08/2022 PCP: Pcp, No     Brief Narrative:  Lives with roommate, H/o NIDDM2 was on insulin before,  ( trying to stay away from insulin, afraid of needle), h/o copd, not on home o2, current smoker    Subjective:  She is on oxygen supplement, continue to have cough, right sided chest /flank pain No edema  Assessment & Plan:  Principal Problem:   Right lower lobe pneumonia Active Problems:   COPD (chronic obstructive pulmonary disease) ([place])   Bipolar 1 disorder ([place])   Essential hypertension   PTSD (post-traumatic stress disorder)   Hypothyroidism   Stage 3b chronic kidney disease (HCC)   Normocytic anemia   Class II obesity   Aortic atherosclerosis (HCC)   Tobacco use   + influenza A,  found to have right lower lobe pneumonia/wheezing/copd exacerbation Start temiflu, continue abx, add steroids/nebs/mucinex   NIDDM2 uncontrolled with hyperglycemia A1c 11.2 On Jardiance and glipizide prior to admission Will need to discuss with patient regarding insulin use Diabetes education  CKDIV Appear close to baseline, renal dosing meds  H/o RBK is prophylaxis  Hypothyroidism Continue Synthroid  History of bipolar, appear stable , continue home meds    Body mass index is 37.55 kg/m.Marland Kitchenreports able to loose weight by diet modification in the past   Pressure Injury 05/10/22 Coccyx Mid Stage 2 -  Partial thickness loss of dermis presenting as a shallow open injury with a red, pink wound bed without slough. tiny open slit at top of buttocks (Active)  05/10/22 1744  Location: Coccyx  Location Orientation: Mid  Staging: Stage 2 -  Partial thickness loss of dermis presenting as a shallow open injury with a red, pink wound bed without slough.  Wound Description (Comments): tiny open slit at top of buttocks  Present on Admission: Yes  Dressing Type None 05/10/22 1741     I have Reviewed nursing notes,  Vitals, pain scores, I/o's, Lab results and  imaging results since pt's last encounter, details please see discussion above  I ordered the following labs:  Unresulted Labs (From admission, onward)     Start     Ordered   05/11/22 0500  CBC with Differential/Platelet  Tomorrow morning,   R        05/10/22 0821   05/11/22 9242  Basic metabolic panel  Tomorrow morning,   R        05/10/22 0821   05/11/22 0500  Magnesium  Tomorrow morning,   R        05/10/22 2141   05/11/22 0500  Phosphorus  Tomorrow morning,   R        05/10/22 2141   05/10/22 1520  Urine Culture  (Urine Culture)  Once,   R       Question:  Indication  Answer:  Flank Pain   05/10/22 1519   05/10/22 1015  Culture, Respiratory w Gram Stain  Once,   R        05/10/22 1015   05/08/22 2218  Urinalysis, Routine w reflex microscopic  Once,   URGENT        05/08/22 2217             DVT prophylaxis: SCDs Start: 05/09/22 1537   Code Status:   Code Status: Full Code  Family Communication: patient  Disposition:   Dispo: The patient is from: home, lives with roommate  Anticipated d/c is to: home              Anticipated d/c date is: >24hrs  Antimicrobials:   Anti-infectives (From admission, onward)    Start     Dose/Rate Route Frequency Ordered Stop   05/11/22 1000  oseltamivir (TAMIFLU) capsule 30 mg        30 mg Oral 2 times daily 05/10/22 1517 05/16/22 0959   05/10/22 1700  azithromycin (ZITHROMAX) tablet 500 mg        500 mg Oral Every 24 hours 05/10/22 1103 05/14/22 1659   05/10/22 1700  oseltamivir (TAMIFLU) capsule 75 mg        75 mg Oral  Once 05/10/22 1629 05/10/22 1724   05/09/22 1415  cefTRIAXone (ROCEPHIN) 2 g in sodium chloride 0.9 % 100 mL IVPB        2 g 200 mL/hr over 30 Minutes Intravenous Every 24 hours 05/09/22 1404 05/14/22 1359   05/09/22 1415  azithromycin (ZITHROMAX) 500 mg in sodium chloride 0.9 % 250 mL IVPB  Status:  Discontinued        500 mg 250 mL/hr over 60 Minutes  Intravenous Every 24 hours 05/09/22 1404 05/10/22 1103           Objective: Vitals:   05/10/22 1203 05/10/22 1300 05/10/22 1500 05/10/22 1703  BP:  113/74 119/62 (!) 138/99  Pulse: (!) 59 62 (!) 59 70  Resp:  18 (!) 24 (!) 22  Temp: 97.8 F (36.6 C)   98.4 F (36.9 C)  TempSrc: Oral   Oral  SpO2: 96%  99% 95%  Weight:      Height:        Intake/Output Summary (Last 24 hours) at 05/10/2022 2141 Last data filed at 05/10/2022 1655 Gross per 24 hour  Intake 1355.42 ml  Output --  Net 1355.42 ml   Filed Weights   05/08/22 2004  Weight: 96.2 kg    Examination:  General exam: alert, awake, communicative,calm, NAD Respiratory system: bilateral diffuse wheezing. Respiratory effort normal. Cardiovascular system:  RRR.  Gastrointestinal system: Abdomen is nondistended, soft and nontender.  Normal bowel sounds heard. Central nervous system: Alert and oriented. No focal neurological deficits. Extremities:  no edema, right bka Skin: No rashes, lesions or ulcers Psychiatry: Judgement and insight appear normal. Mood & affect appropriate.     Data Reviewed: I have personally reviewed  labs and visualized  imaging studies since the last encounter and formulate the plan        Scheduled Meds:  atorvastatin  40 mg Oral Daily   azithromycin  500 mg Oral Q24H   famotidine  20 mg Oral QHS   glipiZIDE  10 mg Oral BID WC   guaiFENesin  600 mg Oral BID   insulin aspart  0-15 Units Subcutaneous TID WC   [START ON 05/11/2022] insulin aspart  3 Units Subcutaneous TID WC   insulin glargine-yfgn  20 Units Subcutaneous QHS   ipratropium-albuterol  3 mL Nebulization TID   levothyroxine  50 mcg Oral QAC breakfast   lurasidone  20 mg Oral QHS   [START ON 05/11/2022] oseltamivir  30 mg Oral BID   [START ON 05/11/2022] predniSONE  50 mg Oral Q breakfast   traZODone  50 mg Oral QHS,MR X 1   Continuous Infusions:  cefTRIAXone (ROCEPHIN)  IV 2 g (05/10/22 1327)     LOS: 1 day      Florencia Reasons, MD PhD FACP [facility]  Available via  Epic secure chat 7am-7pm for nonurgent issues Please page for urgent issues To page the attending provider between 7A-7P or the covering provider during after hours 7P-7A, please log into the web site [email or web address] and access using universal [place] password for that web site. If you do not have the password, please call the hospital operator.    05/10/2022, 9:41 PM

## 2022-05-10 NOTE — Progress Notes (Signed)
PHARMACY NOTE:  ANTIMICROBIAL RENAL DOSAGE ADJUSTMENT  Current antimicrobial regimen includes a mismatch between antimicrobial dosage and estimated renal function.  As per policy approved by the Pharmacy & Therapeutics and Medical Executive Committees, the antimicrobial dosage will be adjusted accordingly.  Current antimicrobial dosage:  Tamiflu 30 mg bid  Indication: influenza   Renal Function:  Estimated Creatinine Clearance: 33.2 mL/min (A) (by C-G formula based on SCr of 2.09 mg/dL (H)). []      On intermittent HD, scheduled: []      On CRRT    Antimicrobial dosage has been changed to:  Tamiflu 75 mg PO x1, then 30 mg bid   Thank you for allowing pharmacy to be a part of this patient's care.  Lynelle Doctor, [facility] 05/10/2022 4:29 PM

## 2022-05-10 NOTE — Progress Notes (Signed)
PHARMACIST - PHYSICIAN COMMUNICATION  CONCERNING: Antibiotic IV to Oral Route Change Policy  RECOMMENDATION: This patient is receiving azithromycin by the intravenous route.  Based on criteria approved by the Pharmacy and Therapeutics Committee, the antibiotic(s) is/are being converted to the equivalent oral dose form(s).   DESCRIPTION: These criteria include:  Patient being treated for a respiratory tract infection, urinary tract infection, cellulitis or clostridium difficile associated diarrhea if on metronidazole  The patient is not neutropenic and does not exhibit a GI malabsorption state  The patient is eating (either orally or via tube) and/or has been taking other orally administered medications for a least 24 hours  The patient is improving clinically and has a Tmax < 100.5  If you have questions about this conversion, please contact the Pharmacy Department  []  ( [phone] )  [place] []  ( [phone] )  [facility] []  ( [phone]  [place] []  ( [phone] )  [facility] [x]  ( [phone] )  [facility]  

## 2022-05-10 NOTE — Progress Notes (Addendum)
Inpatient Diabetes Program Recommendations  AACE/ADA: New Consensus Statement on Inpatient Glycemic Control (2015)  Target Ranges:  Prepandial:   less than 140 mg/dL      Peak postprandial:   less than 180 mg/dL (1-2 hours)      Critically ill patients:  140 - 180 mg/dL   Lab Results  Component Value Date   GLUCAP 236 (H) 05/10/2022   HGBA1C 11.2 (H) 05/09/2022    Review of Glycemic Control  Latest Reference Range & Units 05/08/22 21:21 05/09/22 04:11 05/09/22 18:41 05/09/22 22:32 05/10/22 07:41  Glucose-Capillary 70 - 99 mg/dL 530 (HH) 440 (H) 394 (H) 368 (H) 236 (H)  Diabetes history: DM2 Outpatient Diabetes medications: Jardiance 25 mg QD, glipizide 10 mg BID Current orders for Inpatient glycemic control: Novolog 0-15 TID with meals.  Inpatient Diabetes Program Recommendations:     Please consider adding Semglee 20 units daily.  A1C indicates poorly controlled DM.  May need insulin for home.  Note patient does not have a PCP.    Thanks,  Adah Perl, RN, BC-ADM Inpatient Diabetes Coordinator Pager [phone]  (8a-5p)   Addendum [phone]- Spoke with patient by phone regarding her current A1C of 11.2%.  She states that she has been on insulin in the past but she does not want to take it again due to having a bad experience with low blood sugars.  We reviewed the bad affects of high blood sugars on kidneys, eyes, heart, etc.  May be a candidate for a GLP-1?   She does not have PCP.  We reviewed normal blood sugar values and the importance of eliminating sugar from beverages. She has lost her meter and will need new meter at discharge.  TOC consult in place.

## 2022-05-11 LAB — CBC WITH DIFFERENTIAL/PLATELET
Abs Immature Granulocytes: 0.09 10*3/uL — ABNORMAL HIGH (ref 0.00–0.07)
Basophils Absolute: 0 10*3/uL (ref 0.0–0.1)
Basophils Relative: 0 %
Eosinophils Absolute: 0 10*3/uL (ref 0.0–0.5)
Eosinophils Relative: 0 %
HCT: 30.8 % — ABNORMAL LOW (ref 36.0–46.0)
Hemoglobin: 10.2 g/dL — ABNORMAL LOW (ref 12.0–15.0)
Immature Granulocytes: 1 %
Lymphocytes Relative: 6 %
Lymphs Abs: 0.5 10*3/uL — ABNORMAL LOW (ref 0.7–4.0)
MCH: 30.8 pg (ref 26.0–34.0)
MCHC: 33.1 g/dL (ref 30.0–36.0)
MCV: 93.1 fL (ref 80.0–100.0)
Monocytes Absolute: 0.2 10*3/uL (ref 0.1–1.0)
Monocytes Relative: 2 %
Neutro Abs: 8.6 10*3/uL — ABNORMAL HIGH (ref 1.7–7.7)
Neutrophils Relative %: 91 %
Platelets: 189 10*3/uL (ref 150–400)
RBC: 3.31 MIL/uL — ABNORMAL LOW (ref 3.87–5.11)
RDW: 13 % (ref 11.5–15.5)
WBC: 9.4 10*3/uL (ref 4.0–10.5)
nRBC: 0 % (ref 0.0–0.2)

## 2022-05-11 LAB — BASIC METABOLIC PANEL
Anion gap: 11 (ref 5–15)
BUN: 53 mg/dL — ABNORMAL HIGH (ref 6–20)
CO2: 23 mmol/L (ref 22–32)
Calcium: 8.4 mg/dL — ABNORMAL LOW (ref 8.9–10.3)
Chloride: 96 mmol/L — ABNORMAL LOW (ref 98–111)
Creatinine, Ser: 2.02 mg/dL — ABNORMAL HIGH (ref 0.44–1.00)
GFR, Estimated: 28 mL/min — ABNORMAL LOW (ref >60)
Glucose, Bld: 505 mg/dL (ref 70–99)
Potassium: 4.4 mmol/L (ref 3.5–5.1)
Sodium: 130 mmol/L — ABNORMAL LOW (ref 135–145)

## 2022-05-11 LAB — GLUCOSE, CAPILLARY
Glucose-Capillary: 161 mg/dL — ABNORMAL HIGH (ref 70–99)
Glucose-Capillary: 426 mg/dL — ABNORMAL HIGH (ref 70–99)
Glucose-Capillary: 467 mg/dL — ABNORMAL HIGH (ref 70–99)
Glucose-Capillary: 435 mg/dL — ABNORMAL HIGH (ref 70–99)
Glucose-Capillary: 423 mg/dL — ABNORMAL HIGH (ref 70–99)
Glucose-Capillary: 293 mg/dL — ABNORMAL HIGH (ref 70–99)
Glucose-Capillary: 307 mg/dL — ABNORMAL HIGH (ref 70–99)

## 2022-05-11 LAB — MAGNESIUM: Magnesium: 2.4 mg/dL (ref 1.7–2.4)

## 2022-05-11 LAB — URINE CULTURE: Culture: NO GROWTH

## 2022-05-11 LAB — PHOSPHORUS: Phosphorus: 5.6 mg/dL — ABNORMAL HIGH (ref 2.5–4.6)

## 2022-05-11 MED ORDER — INSULIN ASPART 100 UNIT/ML IJ SOLN
8.0000 [IU] | Freq: Three times a day (TID) | INTRAMUSCULAR | Status: DC
  Administered 2022-05-11 – 2022-05-12 (×4): 8 [IU] via SUBCUTANEOUS

## 2022-05-11 MED ORDER — PREDNISONE 20 MG PO TABS
40.0000 mg | ORAL_TABLET | Freq: Every day | ORAL | Status: DC
  Administered 2022-05-12: 40 mg via ORAL
  Filled 2022-05-11: qty 2

## 2022-05-11 MED ORDER — IPRATROPIUM-ALBUTEROL 0.5-2.5 (3) MG/3ML IN SOLN: 3.0000 mL | Freq: Four times a day (QID) | RESPIRATORY_TRACT | Status: DC | PRN

## 2022-05-11 MED ORDER — SENNOSIDES-DOCUSATE SODIUM 8.6-50 MG PO TABS
1.0000 | ORAL_TABLET | Freq: Two times a day (BID) | ORAL | Status: DC
  Administered 2022-05-11 – 2022-05-13 (×4): 1 via ORAL
  Filled 2022-05-11 (×4): qty 1

## 2022-05-11 MED ORDER — IPRATROPIUM-ALBUTEROL 0.5-2.5 (3) MG/3ML IN SOLN
3.0000 mL | Freq: Two times a day (BID) | RESPIRATORY_TRACT | Status: DC
  Administered 2022-05-11 – 2022-05-12 (×4): 3 mL via RESPIRATORY_TRACT
  Filled 2022-05-11 (×4): qty 3

## 2022-05-11 MED ORDER — INSULIN GLARGINE-YFGN 100 UNIT/ML ~~LOC~~ SOLN
20.0000 [IU] | Freq: Two times a day (BID) | SUBCUTANEOUS | Status: DC
  Administered 2022-05-11: 20 [IU] via SUBCUTANEOUS
  Filled 2022-05-11 (×2): qty 0.2

## 2022-05-11 MED ORDER — INSULIN GLARGINE-YFGN 100 UNIT/ML ~~LOC~~ SOLN
25.0000 [IU] | Freq: Two times a day (BID) | SUBCUTANEOUS | Status: DC
  Administered 2022-05-11: 25 [IU] via SUBCUTANEOUS
  Filled 2022-05-11 (×2): qty 0.25

## 2022-05-11 MED ORDER — INSULIN ASPART 100 UNIT/ML IJ SOLN
5.0000 [IU] | Freq: Three times a day (TID) | INTRAMUSCULAR | Status: DC
  Administered 2022-05-11: 5 [IU] via SUBCUTANEOUS

## 2022-05-11 MED ORDER — INSULIN ASPART 100 UNIT/ML IJ SOLN: 8.0000 [IU] | Freq: Three times a day (TID) | INTRAMUSCULAR | Status: DC

## 2022-05-11 MED ORDER — POLYETHYLENE GLYCOL 3350 17 G PO PACK
17.0000 g | PACK | Freq: Every day | ORAL | Status: AC
  Administered 2022-05-11 – 2022-05-12 (×2): 17 g via ORAL
  Filled 2022-05-11 (×2): qty 1

## 2022-05-11 NOTE — Progress Notes (Addendum)
PROGRESS NOTE    Mallory Potts  YJE:[id number] DOB: March 29, 1966 DOA: 05/08/2022 PCP: Pcp, No     Brief Narrative:  Lives with roommate, H/o NIDDM2 was on insulin before,  ( trying to stay away from insulin, afraid of needle), h/o copd, not on home o2, current smoker    Subjective:   Blood glucose elevated,  She is on room air today, less cough,  less right sided chest /flank pain,. Wheezing has improved  No edema  Assessment & Plan:  Principal Problem:   Right lower lobe pneumonia Active Problems:   COPD (chronic obstructive pulmonary disease) (HCC)   Bipolar 1 disorder (HCC)   Essential hypertension   PTSD (post-traumatic stress disorder)   Hypothyroidism   Stage 3b chronic kidney disease (HCC)   Normocytic anemia   Class II obesity   Aortic atherosclerosis (HCC)   Tobacco use   + influenza A,  found to have right lower lobe pneumonia/wheezing/copd exacerbation Start temiflu, continue abx, add steroids/nebs/mucinex Blood and urine culture no growth, Follow-up on sputum culture   NIDDM2 uncontrolled with hyperglycemia A1c 11.2 On Jardiance and glipizide prior to admission She agreed with insulin while in the hospital, increase long-acting and meal coverage, continue sliding scale, taper steroids as able Diabetes education  CKDIV Appear close to baseline, renal dosing meds  H/o RBK is prophylaxis  Hypothyroidism Continue Synthroid  History of bipolar, appear stable , continue home meds    Body mass index is 37.55 kg/m.Marland Kitchenreports able to loose weight by diet modification in the past   Pressure Injury 05/10/22 Coccyx Mid Stage 2 -  Partial thickness loss of dermis presenting as a shallow open injury with a red, pink wound bed without slough. tiny open slit at top of buttocks (Active)  05/10/22 1744  Location: Coccyx  Location Orientation: Mid  Staging: Stage 2 -  Partial thickness loss of dermis presenting as a shallow open injury with a red, pink wound bed  without slough.  Wound Description (Comments): tiny open slit at top of buttocks  Present on Admission: Yes  Dressing Type None 05/11/22 0700     I have Reviewed nursing notes, Vitals, pain scores, I/o's, Lab results and  imaging results since pt's last encounter, details please see discussion above  I ordered the following labs:  Unresulted Labs (From admission, onward)     Start     Ordered   05/12/22 6378  Basic metabolic panel  Tomorrow morning,   R        05/11/22 0824   05/10/22 1520  Urine Culture  (Urine Culture)  Once,   R       Question:  Indication  Answer:  Flank Pain   05/10/22 1519   05/08/22 2218  Urinalysis, Routine w reflex microscopic  Once,   URGENT        05/08/22 2217             DVT prophylaxis: SCDs Start: 05/09/22 1537   Code Status:   Code Status: Full Code  Family Communication: patient  Disposition:   Dispo: The patient is from: home, lives with roommate               Anticipated d/c is to: home              Anticipated d/c date is: >24hrs  Antimicrobials:   Anti-infectives (From admission, onward)    Start     Dose/Rate Route Frequency Ordered Stop   05/11/22 1000  oseltamivir (TAMIFLU)  capsule 30 mg        30 mg Oral 2 times daily 05/10/22 1517 05/16/22 0959   05/10/22 1700  azithromycin (ZITHROMAX) tablet 500 mg        500 mg Oral Every 24 hours 05/10/22 1103 05/14/22 1659   05/10/22 1700  oseltamivir (TAMIFLU) capsule 75 mg        75 mg Oral  Once 05/10/22 1629 05/10/22 1724   05/09/22 1415  cefTRIAXone (ROCEPHIN) 2 g in sodium chloride 0.9 % 100 mL IVPB        2 g 200 mL/hr over 30 Minutes Intravenous Every 24 hours 05/09/22 1404 05/14/22 1359   05/09/22 1415  azithromycin (ZITHROMAX) 500 mg in sodium chloride 0.9 % 250 mL IVPB  Status:  Discontinued        500 mg 250 mL/hr over 60 Minutes Intravenous Every 24 hours 05/09/22 1404 05/10/22 1103           Objective: Vitals:   05/10/22 1703 05/10/22 2214 05/11/22 0214  05/11/22 0611  BP: (!) 138/99 109/60 139/74 (!) 165/75  Pulse: 70 65 (!) 59 (!) 51  Resp: (!) 22 20 19 16   Temp: 98.4 F (36.9 C) 97.9 F (36.6 C) (!) 97.3 F (36.3 C) (!) 97.3 F (36.3 C)  TempSrc: Oral  Oral Oral  SpO2: 95% 93% 96% 95%  Weight:      Height:        Intake/Output Summary (Last 24 hours) at 05/11/2022 0824 Last data filed at 05/11/2022 0020 Gross per 24 hour  Intake 455 ml  Output --  Net 455 ml   Filed Weights   05/08/22 2004  Weight: 96.2 kg    Examination:  General exam: alert, awake, communicative,calm, NAD Respiratory system: less bilateral diffuse wheezing. Respiratory effort normal. Cardiovascular system:  RRR.  Gastrointestinal system: Abdomen is nondistended, soft and nontender.  Normal bowel sounds heard. Central nervous system: Alert and oriented. No focal neurological deficits. Extremities:  no edema, right bka Skin: No rashes, lesions or ulcers Psychiatry: Judgement and insight appear normal. Mood & affect appropriate.     Data Reviewed: I have personally reviewed  labs and visualized  imaging studies since the last encounter and formulate the plan        Scheduled Meds:  atorvastatin  40 mg Oral Daily   azithromycin  500 mg Oral Q24H   famotidine  20 mg Oral QHS   glipiZIDE  10 mg Oral BID WC   guaiFENesin  600 mg Oral BID   insulin aspart  0-15 Units Subcutaneous TID WC   insulin aspart  3 Units Subcutaneous TID WC   insulin glargine-yfgn  20 Units Subcutaneous BID   ipratropium-albuterol  3 mL Nebulization BID   levothyroxine  50 mcg Oral QAC breakfast   lurasidone  20 mg Oral QHS   oseltamivir  30 mg Oral BID   predniSONE  50 mg Oral Q breakfast   traZODone  50 mg Oral QHS,MR X 1   Continuous Infusions:  cefTRIAXone (ROCEPHIN)  IV Stopped (05/10/22 1357)     LOS: 2 days     Florencia Reasons, MD PhD FACP [facility]  Available via Epic secure chat 7am-7pm for nonurgent issues Please page for urgent issues To  page the attending provider between 7A-7P or the covering provider during after hours 7P-7A, please log into the web site [email or web address] and access using universal [place] password for that web site. If you do not have the password,  please call the hospital operator.    05/11/2022, 8:24 AM

## 2022-05-11 NOTE — TOC Initial Note (Signed)
Transition of Care [facility]) - Initial/Assessment Note    Patient Details  Name: Mallory Potts MRN: [id number] Date of Birth: 06/09/1965  [facility]) CM/SW Contact:    Vassie Moselle, LCSW Phone Number: 05/11/2022, 11:45 AM  Clinical Narrative:                 Met with pt who shares she has been unable to schedule a PCP appointment with a provider who is in network with her insurance since moving to [facility] in June 2023. She is agreeable for in-network providers being added to discharge instructions in order for her to schedule an appointment. PCP information has been added to AVS. No further TOC needs identified. Please reconsult TOC should further needs arise.   Planned Disposition: Home Barriers to Discharge: No Barriers Identified   Patient Goals and CMS Choice Patient states their goals for this hospitalization and ongoing recovery are:: To return home CMS Medicare.gov Compare Post Acute Care list provided to:: Patient Choice offered to / list presented to : Patient [place] ownership interest in [facility].provided to::  (NA)    Expected Discharge Plan and Services Planned Disposition: Home In-house Referral: NA Discharge Planning Services: CM Consult Post Acute Care Choice: NA Living arrangements for the past 2 months: Single Family Home                 DME Arranged: N/A DME Agency: NA                  Prior Living Arrangements/Services Living arrangements for the past 2 months: Single Family Home Lives with:: Self Patient language and need for interpreter reviewed:: Yes Do you feel safe going back to the place where you live?: Yes      Need for Family Participation in Patient Care: No (Comment) Care giver support system in place?: No (comment) Current home services: DME Criminal Activity/Legal Involvement Pertinent to Current Situation/Hospitalization: No - Comment as needed  Activities of Daily Living Home Assistive Devices/Equipment:  Cane (specify quad or straight) ADL Screening (condition at time of admission) Patient's cognitive ability adequate to safely complete daily activities?: Yes Is the patient deaf or have difficulty hearing?: No Does the patient have difficulty seeing, even when wearing glasses/contacts?: No Does the patient have difficulty concentrating, remembering, or making decisions?: No Patient able to express need for assistance with ADLs?: Yes Does the patient have difficulty dressing or bathing?: No Independently performs ADLs?: Yes (appropriate for developmental age) Does the patient have difficulty walking or climbing stairs?: No Weakness of Legs: None Weakness of Arms/Hands: None  Permission Sought/Granted   Permission granted to share information with : No              Emotional Assessment Appearance:: Appears stated age Attitude/Demeanor/Rapport: Engaged Affect (typically observed): Accepting, Pleasant Orientation: : Oriented to Self, Oriented to Place, Oriented to  Time, Oriented to Situation Alcohol / Substance Use: Not Applicable Psych Involvement: No (comment)  Admission diagnosis:  Right lower lobe pneumonia [J18.9] Pneumonia of right lower lobe due to infectious organism [J18.9] Patient Active Problem List   Diagnosis Date Noted   Right lower lobe pneumonia 05/09/2022   Normocytic anemia 05/09/2022   Class II obesity 05/09/2022   Aortic atherosclerosis ([place]) 05/09/2022   Tobacco use 05/09/2022   Generalized anxiety disorder 03/13/2022   PTSD (post-traumatic stress disorder) 03/13/2022   Long term (current) use of non-steroidal anti-inflammatories ([place]) 08/08/2021   Proteinuria 08/08/2021   Microalbuminuria 06/29/2021  Stage 3b chronic kidney disease ([place]) 06/29/2021   Chronic kidney disease, stage 4 (severe) ([place]) 08/20/2020   Hypothyroidism 08/20/2020   Insomnia 08/20/2020   Morbid obesity ([place]) 08/20/2020   Osteoarthritis 08/20/2020   Peripheral neuropathic pain  08/20/2020   Rheumatoid arthritis ([place]) 08/20/2020   Seizure ([place]) 11/13/2014   Acute renal failure ([place]) 10/29/2014   Cellulitis of foot 10/24/2014   Hyponatremia 10/24/2014   Hypokalemia 10/24/2014   Diabetic infection of right foot ([place]) 10/24/2014   DM (diabetes mellitus), type 2, uncontrolled    Epilepsy ([place])    COPD (chronic obstructive pulmonary disease) ([place])    Bipolar 1 disorder (HCC)    OCD (obsessive compulsive disorder)    Hypertension    COLD (chronic obstructive lung disease) ([place])    Essential hypertension    Other epilepsy without status epilepticus, not intractable ([place])    PCP:  Pcp, No Pharmacy:   [id number] [place] [id number]: [phone]: [phone]    Social Determinants of Health (SDOH) Social History: SDOH Screenings   Food Insecurity: No Food Insecurity (05/10/2022)  Housing: Low Risk  (05/10/2022)  Transportation Needs: No Transportation Needs (05/10/2022)  Utilities: Not At Risk (05/10/2022)  Depression (PHQ2-9): High Risk (03/13/2022)  Tobacco Use: High Risk (05/08/2022)   SDOH Interventions:     Readmission Risk Interventions    05/11/2022   11:43 AM  Readmission Risk Prevention Plan  Transportation Screening Complete  Medication Review (RN Care Manager) Complete  PCP or Specialist appointment within 3-5 days of discharge Complete  HRI or HCopeComplete  SW Recovery Care/Counseling Consult Complete  [place] Applicable

## 2022-05-11 NOTE — Progress Notes (Signed)
Inpatient Diabetes Program Recommendations  AACE/ADA: New Consensus Statement on Inpatient Glycemic Control (2015)  Target Ranges:  Prepandial:   less than 140 mg/dL      Peak postprandial:   less than 180 mg/dL (1-2 hours)      Critically ill patients:  140 - 180 mg/dL   Lab Results  Component Value Date   GLUCAP 467 (H) 05/11/2022   HGBA1C 11.2 (H) 05/09/2022    Review of Glycemic Control  Latest Reference Range & Units 05/10/22 07:41 05/10/22 12:07 05/10/22 16:38 05/10/22 22:10 05/11/22 07:06 05/11/22 07:45  Glucose-Capillary 70 - 99 mg/dL 236 (H) 185 (H) 161 (H) 378 (H) 426 (H) 467 (H)  Diabetes history: DM2 Outpatient Diabetes medications: Jardiance 25 mg QD, glipizide 10 mg BID Current orders for Inpatient glycemic control:  Novolog 0-15 TID with meals Semglee 20 units bid Novolog 3 units tid with meals Prednisone 50 mg daily Inpatient Diabetes Program Recommendations:    Agree with increase of insulins.  Patient definitely needs insulin with steroids.  Will follow.   Thanks,  Adah Perl, RN, BC-ADM Inpatient Diabetes Coordinator Pager [phone]  (8a-5p)

## 2022-05-12 LAB — BASIC METABOLIC PANEL
Anion gap: 11 (ref 5–15)
BUN: 57 mg/dL — ABNORMAL HIGH (ref 6–20)
CO2: 22 mmol/L (ref 22–32)
Calcium: 8.8 mg/dL — ABNORMAL LOW (ref 8.9–10.3)
Chloride: 104 mmol/L (ref 98–111)
Creatinine, Ser: 1.81 mg/dL — ABNORMAL HIGH (ref 0.44–1.00)
GFR, Estimated: 32 mL/min — ABNORMAL LOW (ref >60)
Glucose, Bld: 168 mg/dL — ABNORMAL HIGH (ref 70–99)
Potassium: 3.9 mmol/L (ref 3.5–5.1)
Sodium: 137 mmol/L (ref 135–145)

## 2022-05-12 LAB — CULTURE, RESPIRATORY W GRAM STAIN: Culture: NORMAL

## 2022-05-12 LAB — GLUCOSE, CAPILLARY
Glucose-Capillary: 95 mg/dL (ref 70–99)
Glucose-Capillary: 236 mg/dL — ABNORMAL HIGH (ref 70–99)
Glucose-Capillary: 327 mg/dL — ABNORMAL HIGH (ref 70–99)
Glucose-Capillary: 243 mg/dL — ABNORMAL HIGH (ref 70–99)

## 2022-05-12 MED ORDER — INSULIN GLARGINE-YFGN 100 UNIT/ML ~~LOC~~ SOLN
20.0000 [IU] | Freq: Two times a day (BID) | SUBCUTANEOUS | Status: DC
  Administered 2022-05-12 (×2): 20 [IU] via SUBCUTANEOUS
  Filled 2022-05-12 (×3): qty 0.2

## 2022-05-12 MED ORDER — PREDNISONE 20 MG PO TABS
30.0000 mg | ORAL_TABLET | Freq: Every day | ORAL | Status: DC
  Administered 2022-05-13: 30 mg via ORAL
  Filled 2022-05-12: qty 1

## 2022-05-12 MED ORDER — ORAL CARE MOUTH RINSE: 15.0000 mL | OROMUCOSAL | Status: DC | PRN

## 2022-05-12 NOTE — Progress Notes (Addendum)
Inpatient Diabetes Program Recommendations  AACE/ADA: New Consensus Statement on Inpatient Glycemic Control (2015)  Target Ranges:  Prepandial:   less than 140 mg/dL      Peak postprandial:   less than 180 mg/dL (1-2 hours)      Critically ill patients:  140 - 180 mg/dL   Lab Results  Component Value Date   GLUCAP 236 (H) 05/12/2022   HGBA1C 11.2 (H) 05/09/2022    Review of Glycemic Control  Latest Reference Range & Units 05/11/22 07:45 05/11/22 11:49 05/11/22 16:56 05/11/22 20:47 05/11/22 22:58 05/12/22 07:37 05/12/22 11:44  Glucose-Capillary 70 - 99 mg/dL 467 (H) 435 (H) 423 (H) 293 (H) 307 (H) 95 236 (H)  Diabetes history: DM2 Outpatient Diabetes medications: Jardiance 25 mg QD, glipizide 10 mg BID Current orders for Inpatient glycemic control:  Novolog 0-15 TID with meals Semglee 20 units bid Novolog 8 units tid with meals Prednisone 40 mg daily Inpatient Diabetes Program Recommendations:     Spoke with patient again at bedside and she states that she is agreeable to taking insulin at home short term.  She has a roommate who is willing to help her with injections.  She states that she knows how to inject but often it would take almost 30 minutes for her to get up courage to stick herself.   Demonstrated use of insulin pen including applying needle, 1 unit prime, dialing dose and injection technique.  Patient states "that looks so much easier".  Offered to let her put needle on and practice but she states "I got it and my roommate will help me".  Will attach instructions to d/c summary.  Reviewed normal glucose values, hypoglycemia signs, symptoms and treatment and the importance of f/u with PCP. Patient verbalized understanding and was able to teach back through instructions for use of insulin pen.   Patient needs new meter as well. Looks like Levemir is preferred by her insurance.  Consider Levemir 30 units daily?    At D/C, patient will need- 1) Levemir flextouch pen  [phone]) 2) insulin pen needles (#[id number]) 3) CBG meter and supplies (#[id number])  Thanks,  Adah Perl, RN, BC-ADM Inpatient Diabetes Coordinator Pager [phone]  (8a-5p)

## 2022-05-12 NOTE — Progress Notes (Signed)
PROGRESS NOTE    Mallory Potts  HCW:[id number] DOB: 18-Dec-1965 DOA: 05/08/2022 PCP: Pcp, No     Brief Narrative:  Lives with roommate, H/o NIDDM2 was on insulin before,  ( trying to stay away from insulin, afraid of needle), h/o copd, not on home o2, current smoker , presents with sob   Subjective:   Blood glucose remain elevated, but improved  She is on room air , less cough,  less right sided chest /flank pain,. Wheezing has improved  No edema  Assessment & Plan:  Principal Problem:   Right lower lobe pneumonia Active Problems:   COPD (chronic obstructive pulmonary disease) (HCC)   Bipolar 1 disorder (HCC)   Essential hypertension   PTSD (post-traumatic stress disorder)   Hypothyroidism   Stage 3b chronic kidney disease (HCC)   Normocytic anemia   Class II obesity   Aortic atherosclerosis (HCC)   Tobacco use   + influenza A,  found to have right lower lobe pneumonia/wheezing/copd exacerbation Improving om  temiflu and  abx,  continue nebs/mucinex, taper steroids Blood and urine culture , sputum culture, no growth   NIDDM2 uncontrolled with hyperglycemia A1c 11.2 On Jardiance and glipizide prior to admission She agreed with insulin while in the hospital, increase long-acting and meal coverage, continue sliding scale, taper steroids as able Diabetes education  CKDIV Appear close to baseline, renal dosing meds  H/o RBK is prophylaxis  Hypothyroidism Continue Synthroid  History of bipolar, appear stable , continue home meds    Body mass index is 37.55 kg/m.Marland Kitchenreports able to loose weight by diet modification in the past   Pressure Injury 05/10/22 Coccyx Mid Stage 2 -  Partial thickness loss of dermis presenting as a shallow open injury with a red, pink wound bed without slough. tiny open slit at top of buttocks (Active)  05/10/22 1744  Location: Coccyx  Location Orientation: Mid  Staging: Stage 2 -  Partial thickness loss of dermis presenting as a shallow  open injury with a red, pink wound bed without slough.  Wound Description (Comments): tiny open slit at top of buttocks  Present on Admission: Yes  Dressing Type None 05/11/22 1920     I have Reviewed nursing notes, Vitals, pain scores, I/o's, Lab results and  imaging results since pt's last encounter, details please see discussion above  I ordered the following labs:  Unresulted Labs (From admission, onward)     Start     Ordered   05/13/22 1761  Basic metabolic panel  Tomorrow morning,   R        05/12/22 1823   05/08/22 2218  Urinalysis, Routine w reflex microscopic  Once,   URGENT        05/08/22 2217             DVT prophylaxis: SCDs Start: 05/09/22 1537   Code Status:   Code Status: Full Code  Family Communication: patient  Disposition:   Dispo: The patient is from: home, lives with roommate               Anticipated d/c is to: home              Anticipated d/c date is: likely on 12/23  Antimicrobials:   Anti-infectives (From admission, onward)    Start     Dose/Rate Route Frequency Ordered Stop   05/11/22 1000  oseltamivir (TAMIFLU) capsule 30 mg        30 mg Oral 2 times daily 05/10/22 1517 05/16/22 0959  05/10/22 1700  azithromycin (ZITHROMAX) tablet 500 mg        500 mg Oral Every 24 hours 05/10/22 1103 05/14/22 1659   05/10/22 1700  oseltamivir (TAMIFLU) capsule 75 mg        75 mg Oral  Once 05/10/22 1629 05/10/22 1724   05/09/22 1415  cefTRIAXone (ROCEPHIN) 2 g in sodium chloride 0.9 % 100 mL IVPB        2 g 200 mL/hr over 30 Minutes Intravenous Every 24 hours 05/09/22 1404 05/14/22 1359   05/09/22 1415  azithromycin (ZITHROMAX) 500 mg in sodium chloride 0.9 % 250 mL IVPB  Status:  Discontinued        500 mg 250 mL/hr over 60 Minutes Intravenous Every 24 hours 05/09/22 1404 05/10/22 1103           Objective: Vitals:   05/11/22 1438 05/11/22 2008 05/12/22 0359 05/12/22 1534  BP: 135/68 138/70 138/69 134/63  Pulse: 68 61 (!) 50 (!) 58  Resp:  18 18 18 18   Temp: 97.7 F (36.5 C) 97.6 F (36.4 C)  98.3 F (36.8 C)  TempSrc:  Oral  Oral  SpO2: 97% 98% 97% 90%  Weight:      Height:        Intake/Output Summary (Last 24 hours) at 05/12/2022 1824 Last data filed at 05/12/2022 1603 Gross per 24 hour  Intake 909 ml  Output --  Net 909 ml   Filed Weights   05/08/22 2004  Weight: 96.2 kg    Examination:  General exam: alert, awake, communicative,calm, NAD Respiratory system: minimal scattered end respiratory wheezing. Respiratory effort normal. Cardiovascular system:  RRR.  Gastrointestinal system: Abdomen is nondistended, soft and nontender.  Normal bowel sounds heard. Central nervous system: Alert and oriented. No focal neurological deficits. Extremities:  no edema, right bka Skin: No rashes, lesions or ulcers Psychiatry: Judgement and insight appear normal. Mood & affect appropriate.     Data Reviewed: I have personally reviewed  labs and visualized  imaging studies since the last encounter and formulate the plan        Scheduled Meds:  atorvastatin  40 mg Oral Daily   azithromycin  500 mg Oral Q24H   famotidine  20 mg Oral QHS   glipiZIDE  10 mg Oral BID WC   guaiFENesin  600 mg Oral BID   insulin aspart  0-15 Units Subcutaneous TID WC   insulin aspart  8 Units Subcutaneous TID WC   insulin glargine-yfgn  20 Units Subcutaneous BID   ipratropium-albuterol  3 mL Nebulization BID   levothyroxine  50 mcg Oral QAC breakfast   lurasidone  20 mg Oral QHS   oseltamivir  30 mg Oral BID   predniSONE  40 mg Oral Q breakfast   senna-docusate  1 tablet Oral BID   traZODone  50 mg Oral QHS,MR X 1   Continuous Infusions:  cefTRIAXone (ROCEPHIN)  IV Stopped (05/12/22 1521)     LOS: 3 days     Florencia Reasons, MD PhD FACP [facility]  Available via Epic secure chat 7am-7pm for nonurgent issues Please page for urgent issues To page the attending provider between 7A-7P or the covering provider during after  hours 7P-7A, please log into the web site [email or web address] and access using universal [place] password for that web site. If you do not have the password, please call the hospital operator.    05/12/2022, 6:24 PM

## 2022-05-13 DIAGNOSIS — J09X1 Influenza due to identified novel influenza A virus with pneumonia: Secondary | ICD-10-CM

## 2022-05-13 DIAGNOSIS — E119 Type 2 diabetes mellitus without complications: Secondary | ICD-10-CM

## 2022-05-13 DIAGNOSIS — Z794 Long term (current) use of insulin: Secondary | ICD-10-CM

## 2022-05-13 LAB — BASIC METABOLIC PANEL
Anion gap: 9 (ref 5–15)
BUN: 51 mg/dL — ABNORMAL HIGH (ref 6–20)
CO2: 22 mmol/L (ref 22–32)
Calcium: 9 mg/dL (ref 8.9–10.3)
Chloride: 105 mmol/L (ref 98–111)
Creatinine, Ser: 2.1 mg/dL — ABNORMAL HIGH (ref 0.44–1.00)
GFR, Estimated: 27 mL/min — ABNORMAL LOW (ref >60)
Glucose, Bld: 69 mg/dL — ABNORMAL LOW (ref 70–99)
Potassium: 3.9 mmol/L (ref 3.5–5.1)
Sodium: 136 mmol/L (ref 135–145)

## 2022-05-13 LAB — GLUCOSE, CAPILLARY
Glucose-Capillary: 67 mg/dL — ABNORMAL LOW (ref 70–99)
Glucose-Capillary: 126 mg/dL — ABNORMAL HIGH (ref 70–99)

## 2022-05-13 MED ORDER — HYDROCHLOROTHIAZIDE 12.5 MG PO TABS: 12.5000 mg | ORAL_TABLET | Freq: Every day | ORAL | Status: AC

## 2022-05-13 MED ORDER — INSULIN ASPART 100 UNIT/ML FLEXPEN: PEN_INJECTOR | SUBCUTANEOUS | 1 refills | Status: AC

## 2022-05-13 MED ORDER — "PEN NEEDLES 3/16"" 31G X 5 MM MISC": 2 refills | Status: AC

## 2022-05-13 MED ORDER — PREDNISONE 10 MG PO TABS: ORAL_TABLET | ORAL | 0 refills | Status: DC

## 2022-05-13 MED ORDER — LEVEMIR FLEXTOUCH 100 UNIT/ML ~~LOC~~ SOPN: 10.0000 [IU] | PEN_INJECTOR | Freq: Every day | SUBCUTANEOUS | 0 refills | Status: AC

## 2022-05-13 MED ORDER — OSELTAMIVIR PHOSPHATE 30 MG PO CAPS: 30.0000 mg | ORAL_CAPSULE | Freq: Two times a day (BID) | ORAL | 0 refills | Status: AC

## 2022-05-13 MED ORDER — INSULIN ASPART 100 UNIT/ML FLEXPEN: PEN_INJECTOR | SUBCUTANEOUS | 1 refills | Status: DC

## 2022-05-13 MED ORDER — INSULIN GLARGINE-YFGN 100 UNIT/ML ~~LOC~~ SOLN
20.0000 [IU] | Freq: Every day | SUBCUTANEOUS | Status: DC
  Administered 2022-05-13: 20 [IU] via SUBCUTANEOUS
  Filled 2022-05-13: qty 0.2

## 2022-05-13 MED ORDER — BLOOD GLUCOSE MONITOR KIT: PACK | 0 refills | Status: AC

## 2022-05-13 NOTE — Progress Notes (Signed)
Patient discharge complete, waiting for last dose of antibiotics to finish prior to leaving.

## 2022-05-13 NOTE — Discharge Summary (Addendum)
Discharge Summary  Mallory Potts EVO:[id number] DOB: 1965-06-16  PCP: Pcp, No  Admit date: 05/08/2022 Discharge date: 05/13/2022  30 Day Unplanned Readmission Risk Score    Flowsheet Row ED to Hosp-Admission (Current) from 05/08/2022 in [facility]  30 Day Unplanned Readmission Risk Score (%) 28.8 Filed at 05/13/2022 0800       This score is the patient's risk of an unplanned readmission within 30 days of being discharged (0 -100%). The score is based on dignosis, age, lab data, medications, orders, and past utilization.   Low:  0-14.9   Medium: 15-21.9   High: 22-29.9   Extreme: 30 and above         Time spent: 65mns, more than 50% time spent on coordination of care.   Recommendations for Outpatient Follow-up:  F/u with PCP within a week  for hospital discharge follow up, repeat cbc/bmp at follow up, pcp to monitor blood glucose level and kidney function ,  Repeat cxr in 4-6 weeks to ensure resolution of right side lung infiltrate    Discharge Diagnoses:  Active Hospital Problems   Diagnosis Date Noted   Right lower lobe pneumonia 05/09/2022   Normocytic anemia 05/09/2022   Class II obesity 05/09/2022   Aortic atherosclerosis ([facility] 05/09/2022   Tobacco use 05/09/2022   PTSD (post-traumatic stress disorder) 03/13/2022   Stage 3b chronic kidney disease ([facility] 06/29/2021   Hypothyroidism 08/20/2020   Essential hypertension    Bipolar 1 disorder (HCC)    COPD (chronic obstructive pulmonary disease) ([facility]     Resolved Hospital Problems  No resolved problems to display.    Discharge Condition: stable  Diet recommendation: heart healthy/carb modified  Filed Weights   05/08/22 2004  Weight: 96.2 kg    History of present illness: ( per admitting MD DR OOlevia Bowens MCarson Myrtleis a 56y.o. female with medical history significant of anxiety, osteoarthritis, asthma, bipolar 1 disorder, COPD, depression, type 2 diabetes, epilepsy,  hypertension, OCD, peripheral edema, seizure disorder, hypothyroidism, recently convalescent with influenza who is coming to the emergency department with complaints of vomiting 2 days ago, right flank/right lower chest wall pain.  She has been out of her glipizide for the past 3 weeks.  She received 300 mL normal saline and a DuoNeb with EMS.  She fever, chills, rhinorrhea, sore throat when she had influenza, but those symptoms have since then resolved.  Positive wheezing, but no hemoptysis.  No chest pain, palpitations, diaphoresis, PND, orthopnea or pitting edema of the lower extremities.  No abdominal pain, diarrhea, constipation, melena or hematochezia.  No dysuria, frequency or hematuria.  No polyuria, polydipsia, polyphagia or blurred vision.    ED course: Initial vital signs were temperature 98.9 F, pulse 95, respirations 16, BP 120/62 mmHg O2 sat 95% on room air.  The patient received 1000 mL of LR bolus, Zofran 4 mg IVP, oxycodone/acetaminophen 5/325 mg 1 tablet p.o., ceftriaxone 2 g IVPB and azithromycin 500 mg IVPB.   Lab work: Her CBC showed a white count of 23.4, hemoglobin 11.4 g/dL platelets 183.  Lipase was 54 units/L.  CMP with a glucose of 451, BUN 37 and creatinine 2.46 mg/dL.  Total protein was 8.3 g/deciliter.  The rest of the CMP measurements are normal after sodium correction.   Imaging: 2 view chest radiograph shows right lower lobe pneumonia.  Follow-up imaging recommended in 6 weeks.  CT abdomen/pelvis without contrast showing right lower lobe airspace disease.  Nonspecific perinephric soft tissue  stranding, left greater than right.  No evidence of urinary tract calculus or hydronephrosis.  Small amount of air in the urinary bladder.  There is an umbilical hernia containing only fat, minimally enlarged from remote prior CT.  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  Aortic  atherosclerosis.  Hospital Course:  Principal Problem:   Right lower lobe pneumonia Active Problems:   COPD (chronic obstructive pulmonary disease) (HCC)   Bipolar 1 disorder (HCC)   Essential hypertension   PTSD (post-traumatic stress disorder)   Hypothyroidism   Stage 3b chronic kidney disease (HCC)   Normocytic anemia   Class II obesity   Aortic atherosclerosis (HCC)   Tobacco use   Assessment and Plan:    + influenza A,  found to have right lower lobe pneumonia/wheezing/copd exacerbation Blood culture, urine culture  and sputum culture no growth Improving om  temiflu , abx,  nebs/mucinex, taper steroids Need to repeat cxr in 4-6 weeks to ensure resolution of right lower lobe infiltrate       NIDDM2 uncontrolled with hyperglycemia A1c 11.2 On Jardiance and glipizide prior to admission She initially declined insulin use due to fear of needles, she eventually agreed to  insulin while in the hospital and after discharge ( reports roommate who has diabetes can help), seen by Diabetes RN He blood glucose has come down from tapering steroids , she is discharged on levemir 10unit daily and novolog ssi, she is to follow up with pcp    CKDIV Appear close to baseline, renal dosing meds F/u with pcp   H/o RBK is prophylaxis   Hypothyroidism Continue Synthroid   History of bipolar, appear stable , continue home meds      Body mass index is 37.55 kg/m.Marland Kitchenreports able to loose weight by diet modification in the past        Pressure Injury 05/10/22 Coccyx Mid Stage 2 -  Partial thickness loss of dermis presenting as a shallow open injury with a red, pink wound bed without slough. tiny open slit at top of buttocks (Active)  05/10/22 1744  Location: Coccyx  Location Orientation: Mid  Staging: Stage 2 -  Partial thickness loss of dermis presenting as a shallow open injury with a red, pink wound bed without slough.  Wound Description (Comments): tiny open slit at top of  buttocks  Present on Admission: Yes  Dressing Type None 05/11/22 1920        Discharge Exam: BP 132/70 (BP Location: Left Arm)   Pulse (!) 50   Temp 97.6 F (36.4 C) (Oral)   Resp 18   Ht _0  (1.6 m)   Wt 96.2 kg   SpO2 98%   BMI 37.55 kg/m   General: NAD, pleasant  Cardiovascular: RRR Respiratory: CTABL    Discharge Instructions     Diet - low sodium heart healthy   Complete by: As directed    Carb modified diet   Discharge wound care:   Complete by: As directed    Pressure offloading , f/u with pcp   Increase activity slowly   Complete by: As directed       Allergies as of 05/13/2022       Reactions   Lithium Shortness Of Breath   Chantix [varenicline] Other (See Comments)   Negative emotions   Milk-related Compounds Nausea And Vomiting   Other Other (See Comments)   Bell peppers = Mouth sores  Wellbutrin [bupropion] Other (See Comments)   "I lose all emotions" = zombie   Abilify [aripiprazole] Rash   Hydrocortisone Rash        Medication List     TAKE these medications    acetaminophen 500 MG tablet Commonly known as: TYLENOL Take 1,000 mg by mouth every 6 (six) hours as needed (for arthritic pain).   albuterol 108 (90 Base) MCG/ACT inhaler Commonly known as: VENTOLIN HFA Inhale 2 puffs into the lungs every 6 (six) hours as needed for wheezing or shortness of breath.   atorvastatin 40 MG tablet Commonly known as: LIPITOR Take 40 mg by mouth every evening.   blood glucose meter kit and supplies Kit Dispense based on patient and insurance preference. Use up to four times daily as directed.   diphenhydrAMINE 25 MG tablet Commonly known as: BENADRYL Take 25 mg by mouth every 6 (six) hours as needed for itching or allergies.   famotidine 20 MG tablet Commonly known as: PEPCID Take 20-60 mg by mouth at bedtime.   fluticasone 50 MCG/ACT nasal spray Commonly known as: FLONASE Place 1-2 sprays into both nostrils daily as needed (for  seasonal allergies).   fluticasone-salmeterol 250-50 MCG/ACT Aepb Commonly known as: ADVAIR Inhale 1 puff into the lungs in the morning and at bedtime.   gabapentin 100 MG capsule Commonly known as: NEURONTIN Take 200-300 mg by mouth at bedtime as needed (for neuropathy).   glipiZIDE 10 MG tablet Commonly known as: GLUCOTROL Take 10 mg by mouth in the morning and at bedtime.   hydrochlorothiazide 12.5 MG tablet Commonly known as: HYDRODIURIL Take 1 tablet (12.5 mg total) by mouth daily. Start taking on: May 16, 2022 What changed: These instructions start on May 16, 2022. If you are unsure what to do until then, ask your doctor or other care provider.   hydrOXYzine 25 MG tablet Commonly known as: ATARAX Take 1 tablet (25 mg total) by mouth 3 (three) times daily. What changed: when to take this   insulin aspart 100 UNIT/ML FlexPen Commonly known as: NOVOLOG Insulin sliding scale: Blood sugar  120-150   3units                       151-200   4units                       201-250   7units                       251- 300  11units                       301-350   15uints                       351-400   20units                       >400         call MD immediately   Jardiance 25 MG Tabs tablet Generic drug: empagliflozin Take 25 mg by mouth daily.   Levemir FlexTouch 100 UNIT/ML FlexPen Generic drug: insulin detemir Inject 10 Units into the skin daily.   levothyroxine 50 MCG tablet Commonly known as: SYNTHROID Take 50 mcg by mouth daily before breakfast.   lurasidone 20 MG Tabs tablet Commonly known as: LATUDA Take 1 tablet (20 mg total) by mouth at  bedtime.   Mucinex 600 MG 12 hr tablet Generic drug: guaiFENesin Take 600 mg by mouth 2 (two) times daily as needed for cough or to loosen phlegm.   oseltamivir 30 MG capsule Commonly known as: TAMIFLU Take 1 capsule (30 mg total) by mouth 2 (two) times daily for 2 days.   Pen Needles 3/16" 31G X 5 MM Misc For  insulin dependent dm2   predniSONE 10 MG tablet Commonly known as: DELTASONE Please take prednisone with breakfast, take 71m (two tabs) on 12/24 morning then take 146m( one tab) on 12/25 morning, then stop.   traZODone 50 MG tablet Commonly known as: DESYREL Take 1-3 tablets (50-150 mg total) by mouth at bedtime.   Vitamin D3 Super Strength 50 MCG (2000 UT) Caps Generic drug: Cholecalciferol Take 2,000 Units by mouth daily.               Discharge Care Instructions  (From admission, onward)           Start     Ordered   05/13/22 0000  Discharge wound care:       Comments: Pressure offloading , f/u with pcp   05/13/22 0902           Allergies  Allergen Reactions   Lithium Shortness Of Breath   Chantix [Varenicline] Other (See Comments)    Negative emotions   Milk-Related Compounds Nausea And Vomiting   Other Other (See Comments)    Bell peppers = Mouth sores    Wellbutrin [Bupropion] Other (See Comments)    "I lose all emotions" = zombie   Abilify [Aripiprazole] Rash   Hydrocortisone Rash    Follow-up Information     f/u with pcp to repeat chest x ray in 4-6 weeks to ensure resolution of right lower lobe infiltrate/pneumonia Follow up.          [place], [place].   Specialty: Family Medicine Why: Please call to schedule a primary care appointment Contact information: [id number] [address].   Why: Please call to schedule primary care appointment Contact information: 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        f/u with pcp Follow up in 2 week(s).   Why: for hospital discharge  follow up, repeat basic lab works including cbc /bmp at hospital discharge follow up pcp to monitor blood glucose level, kidney function                 The results of significant diagnostics from this hospitalization (including imaging, microbiology, ancillary and laboratory) are listed below for reference.    Significant Diagnostic Studies: DG Chest 2 View  Result Date: 05/09/2022 CLINICAL DATA:  Cough. Right flank and rib pain. Vomited 3 days ago. EXAM: CHEST - 2 VIEW COMPARISON:  Chest two views 06/19/2011; CT abdomen and pelvis 05/09/2022 FINDINGS: Cardiac silhouette and mediastinal contours are within normal limits. There is a density within the far inferior right lung abutting the right hemidiaphragm. The left lung appears clear. No pleural effusion or pneumothorax. Moderate multilevel degenerative disc changes of the thoracic spine. Densities in the patient's clothing overlie the left-greater-than-right  hemi thoraces. IMPRESSION: Right lower lobe pneumonia. This was also better seen on today's CT of the abdomen and pelvis. Recommend follow-up radiographs 6 weeks after treatment to ensure resolution. Electronically Signed   By: Yvonne Kendall M.D.   On: 05/09/2022 13:53   CT ABDOMEN PELVIS WO CONTRAST  Result Date: 05/09/2022 CLINICAL DATA:  Abdominal pain, acute nonlocalized. Vomiting for 2 days with right flank pain. Flu last week. EXAM: CT ABDOMEN AND PELVIS WITHOUT CONTRAST TECHNIQUE: Multidetector CT imaging of the abdomen and pelvis was performed following the standard protocol without IV contrast. RADIATION DOSE REDUCTION: This exam was performed according to the departmental dose-optimization program which includes automated exposure control, adjustment of the mA and/or kV according to patient size and/or use of iterative reconstruction technique. COMPARISON:  Abdominopelvic CT 10/29/2012. FINDINGS: Lower chest: Mild aortic atherosclerosis and a small hiatal hernia. There is  dependent right lower lobe airspace disease suspicious for pneumonia. There is a small right lower lobe calcified granuloma. The left lung base appears clear. No significant pleural or pericardial effusion. Hepatobiliary: Suspected mild hepatic steatosis without evidence of focal abnormality on noncontrast imaging. The gallbladder is incompletely distended. No evidence of gallstones, gallbladder wall thickening or biliary dilatation. Pancreas: Unremarkable. No pancreatic ductal dilatation or surrounding inflammatory changes. Spleen: Normal in size without focal abnormality. Adrenals/Urinary Tract: Both adrenal glands appear normal. Increased perinephric soft tissue stranding, left greater than right. No evidence of focal perinephric fluid collection, urinary tract calculus or hydronephrosis. Mild bladder distension. There is a small amount of air in the urinary bladder which may be iatrogenic. No other bladder abnormality identified. Stomach/Bowel: No enteric contrast administered. The stomach appears unremarkable for its degree of distension. No evidence of bowel wall thickening, distention or surrounding inflammatory change. The appendix appears normal. Mild sigmoid diverticulosis. Vascular/Lymphatic: There are no enlarged abdominal or pelvic lymph nodes. Aortic and branch vessel atherosclerosis without evidence of aneurysm. Reproductive: The uterus and ovaries appear normal. No adnexal mass. Other: 5.5 cm umbilical hernia containing only fat. No herniated bowel, ascites or free air. Musculoskeletal: No acute or significant osseous findings. 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 IMPRESSION: 1. Right lower lobe airspace disease suspicious for pneumonia. Recommend chest radiographic follow-up to radiographic resolution. 2. Nonspecific perinephric soft tissue stranding, left greater than right. No evidence of  urinary tract calculus or hydronephrosis. Small amount of air in the urinary bladder, likely iatrogenic. 3. Umbilical hernia containing only fat, minimally enlarged from remote prior CT. 4. 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 5.  Aortic Atherosclerosis (ICD10-I70.0). Electronically Signed   By: Richardean Sale M.D.   On: 05/09/2022 10:58    Microbiology: Recent Results (from the past 240 hour(s))  Culture, blood (single)     Status: None (Preliminary result)   Collection Time: 05/09/22  3:40 PM   Specimen: BLOOD  Result Value Ref Range Status   Specimen Description   Final    BLOOD RIGHT ANTECUBITAL Performed at [facility] [address], [place], [place] [address]    Special Requests   Final    BOTTLES DRAWN AEROBIC AND ANAEROBIC Blood Culture results may not be optimal due to an excessive volume of blood received in culture bottles Performed at [facility] [address] [place], [place] [address]    Culture   Final    NO GROWTH  4 DAYS Performed at [facility], [place] [address], [place], [place] [address]    Report Status PENDING  Incomplete  Resp panel by RT-PCR (RSV, Flu A&B, Covid) Anterior Nasal Swab     Status: Abnormal   Collection Time: 05/10/22  8:17 AM   Specimen: Anterior Nasal Swab  Result Value Ref Range Status   SARS Coronavirus 2 by RT PCR NEGATIVE NEGATIVE Final    Comment: (NOTE) SARS-CoV-2 target nucleic acids are NOT DETECTED.  The SARS-CoV-2 RNA is generally detectable in upper respiratory specimens during the acute phase of infection. The lowest concentration of SARS-CoV-2 viral copies this assay can detect is 138 copies/mL. A negative result does not preclude SARS-Cov-2 infection and should not be used as the sole basis for treatment or other patient management decisions. A negative result may occur with   improper specimen collection/handling, submission of specimen other than nasopharyngeal swab, presence of viral mutation(s) within the areas targeted by this assay, and inadequate number of viral copies(<138 copies/mL). A negative result must be combined with clinical observations, patient history, and epidemiological information. The expected result is Negative.  Fact Sheet for Patients:  EntrepreneurPulse.com.au  Fact Sheet for Healthcare Providers:  IncredibleEmployment.be  This test is no t yet approved or cleared by the [place] FDA and  has been authorized for detection and/or diagnosis of SARS-CoV-2 by FDA under an Emergency Use Authorization (EUA). This EUA will remain  in effect (meaning this test can be used) for the duration of the COVID-19 declaration under Section 564(b)(1) of the Act, 21 U.S.C.section 360bbb-3(b)(1), unless the authorization is terminated  or revoked sooner.       Influenza A by PCR POSITIVE (A) NEGATIVE Final   Influenza B by PCR NEGATIVE NEGATIVE Final    Comment: (NOTE) The Xpert Xpress SARS-CoV-2/FLU/RSV plus assay is intended as an aid in the diagnosis of influenza from Nasopharyngeal swab specimens and should not be used as a sole basis for treatment. Nasal washings and aspirates are unacceptable for Xpert Xpress SARS-CoV-2/FLU/RSV testing.  Fact Sheet for Patients: EntrepreneurPulse.com.au  Fact Sheet for Healthcare Providers: IncredibleEmployment.be  This test is not yet approved or cleared by the [place] FDA and has been authorized for detection and/or diagnosis of SARS-CoV-2 by FDA under an Emergency Use Authorization (EUA). This EUA will remain in effect (meaning this test can be used) for the duration of the COVID-19 declaration under Section 564(b)(1) of the Act, 21 U.S.C. section 360bbb-3(b)(1), unless the authorization is terminated  or revoked.     Resp Syncytial Virus by PCR NEGATIVE NEGATIVE Final    Comment: (NOTE) Fact Sheet for Patients: EntrepreneurPulse.com.au  Fact Sheet for Healthcare Providers: IncredibleEmployment.be  This test is not yet approved or cleared by the [place] FDA and has been authorized for detection and/or diagnosis of SARS-CoV-2 by FDA under an Emergency Use Authorization (EUA). This EUA will remain in effect (meaning this test can be used) for the duration of the COVID-19 declaration under Section 564(b)(1) of the Act, 21 U.S.C. section 360bbb-3(b)(1), unless the authorization is terminated or revoked.  Performed at [facility], [place] [address] [place], [place] [address]   Expectorated Sputum Assessment w Gram Stain, Rflx to Resp Cult     Status: None   Collection Time: 05/10/22 10:15 AM   Specimen: Expectorated Sputum  Result Value Ref Range Status   Specimen Description EXPECTORATED SPUTUM  Final   Special Requests NONE  Final   Sputum evaluation  Final    THIS SPECIMEN IS ACCEPTABLE FOR SPUTUM CULTURE Performed at [facility] [address], [place], [place] [address]    Report Status 05/10/2022 FINAL  Final  Culture, Respiratory w Gram Stain     Status: None   Collection Time: 05/10/22 10:15 AM  Result Value Ref Range Status   Specimen Description   Final    EXPECTORATED SPUTUM Performed at [facility], [place] [address], [place], [place] [address]    Special Requests   Final    NONE Reflexed from [id number] Performed at [facility], [place] [address] [place], [place] [address]    Gram Stain   Final    FEW SQUAMOUS EPITHELIAL CELLS PRESENT FEW WBC PRESENT, PREDOMINANTLY MONONUCLEAR FEW GRAM POSITIVE RODS FEW GRAM NEGATIVE RODS FEW GRAM POSITIVE COCCI IN CLUSTERS    Culture   Final    FEW Normal respiratory flora-no Staph aureus or Pseudomonas  seen Performed at [facility], [address] [place], [place] [address]    Report Status 05/12/2022 FINAL  Final  Urine Culture     Status: None   Collection Time: 05/10/22  3:49 PM   Specimen: Urine, Clean Catch  Result Value Ref Range Status   Specimen Description   Final    URINE, CLEAN CATCH Performed at [facility], [place] [address], [place], [place] [address]    Special Requests   Final    NONE Performed at [facility], [place] [address] [place], [place] [address]    Culture   Final    NO GROWTH Performed at [facility], [place] [address] [place], [place] [address]    Report Status 05/11/2022 FINAL  Final     Labs: Basic Metabolic Panel: Recent Labs  Lab 05/09/22 0412 05/10/22 0545 05/11/22 0508 05/12/22 0529 05/13/22 0703  NA 129* 133* 130* 137 136  K 4.7 3.7 4.4 3.9 3.9  CL 94* 99 96* 104 105  CO2 _0 GLUCOSE 451* 249* 505* 168* 69*  BUN 37* 40* 53* 57* 51*  CREATININE 2.46* 2.09* 2.02* 1.81* 2.10*  CALCIUM 9.2 8.5* 8.4* 8.8* 9.0  MG  --   --  2.4  --   --   PHOS  --   --  5.6*  --   --    Liver Function Tests: Recent Labs  Lab 05/09/22 0412 05/10/22 0545  AST 15 9*  ALT 10 9  ALKPHOS 78 54  BILITOT 0.9 0.3  PROT 8.3* 6.8  ALBUMIN 3.5 2.5*   Recent Labs  Lab 05/09/22 0412  LIPASE 54*   No results for input(s): "AMMONIA" in the last 168 hours. CBC: Recent Labs  Lab 05/09/22 0412 05/10/22 0545 05/11/22 0508  WBC 23.4* 16.1* 9.4  NEUTROABS  --   --  8.6*  HGB 11.4* 9.5* 10.2*  HCT 34.7* 29.5* 30.8*  MCV 93.0 93.1 93.1  PLT 183 157 189   Cardiac Enzymes: No results for input(s): "CKTOTAL", "CKMB", "CKMBINDEX", "TROPONINI" in the last 168 hours. BNP: BNP (last 3 results) No results for input(s): "BNP" in the last 8760 hours.  ProBNP (last 3 results) No results for input(s): "PROBNP" in the last 8760 hours.  CBG: Recent Labs  Lab 05/12/22 1144 05/12/22 1629  05/12/22 2114 05/13/22 0752 05/13/22 0833  GLUCAP 236* 327* 243* 67* 126*    FURTHER DISCHARGE INSTRUCTIONS:   Get Medicines reviewed and adjusted: Please  take all your medications with you for your next visit with your Primary MD   Laboratory/radiological data: Please request your Primary MD to go over all hospital tests and procedure/radiological results at the follow up, please ask your Primary MD to get all Hospital records sent to his/her office.   In some cases, they will be blood work, cultures and biopsy results pending at the time of your discharge. Please request that your primary care M.D. goes through all the records of your hospital data and follows up on these results.   Also Note the following: If you experience worsening of your admission symptoms, develop shortness of breath, life threatening emergency, suicidal or homicidal thoughts you must seek medical attention immediately by calling 911 or calling your MD immediately  if symptoms less severe.   You must read complete instructions/literature along with all the possible adverse reactions/side effects for all the Medicines you take and that have been prescribed to you. Take any new Medicines after you have completely understood and accpet all the possible adverse reactions/side effects.    Do not drive when taking Pain medications or sleeping medications (Benzodaizepines)   Do not take more than prescribed Pain, Sleep and Anxiety Medications. It is not advisable to combine anxiety,sleep and pain medications without talking with your primary care practitioner   Special Instructions: If you have smoked or chewed Tobacco  in the last 2 yrs please stop smoking, stop any regular Alcohol  and or any Recreational drug use.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

## 2022-05-13 NOTE — Plan of Care (Signed)
  Problem: Activity: Goal: Ability to tolerate increased activity will improve Outcome: Progressing   Problem: Respiratory: Goal: Ability to maintain adequate ventilation will improve Outcome: Progressing   Problem: Education: Goal: Ability to describe self-care measures that may prevent or decrease complications (Diabetes Survival Skills Education) will improve Outcome: Progressing

## 2022-05-14 LAB — CULTURE, BLOOD (SINGLE): Culture: NO GROWTH

## 2022-06-01 DIAGNOSIS — N184 Chronic kidney disease, stage 4 (severe): Secondary | ICD-10-CM | POA: Diagnosis not present

## 2022-06-01 DIAGNOSIS — I10 Essential (primary) hypertension: Secondary | ICD-10-CM | POA: Diagnosis not present

## 2022-06-01 DIAGNOSIS — K219 Gastro-esophageal reflux disease without esophagitis: Secondary | ICD-10-CM | POA: Diagnosis not present

## 2022-06-01 DIAGNOSIS — E1165 Type 2 diabetes mellitus with hyperglycemia: Secondary | ICD-10-CM | POA: Diagnosis not present

## 2022-06-01 DIAGNOSIS — G6281 Critical illness polyneuropathy: Secondary | ICD-10-CM | POA: Diagnosis not present

## 2022-06-01 DIAGNOSIS — Z794 Long term (current) use of insulin: Secondary | ICD-10-CM | POA: Diagnosis not present

## 2022-06-01 DIAGNOSIS — E039 Hypothyroidism, unspecified: Secondary | ICD-10-CM | POA: Diagnosis not present

## 2022-06-01 DIAGNOSIS — I129 Hypertensive chronic kidney disease with stage 1 through stage 4 chronic kidney disease, or unspecified chronic kidney disease: Secondary | ICD-10-CM | POA: Diagnosis not present

## 2022-06-01 DIAGNOSIS — Z09 Encounter for follow-up examination after completed treatment for conditions other than malignant neoplasm: Secondary | ICD-10-CM | POA: Diagnosis not present

## 2022-06-01 DIAGNOSIS — J449 Chronic obstructive pulmonary disease, unspecified: Secondary | ICD-10-CM | POA: Diagnosis not present

## 2022-06-14 ENCOUNTER — Encounter (HOSPITAL_COMMUNITY): Payer: Self-pay | Admitting: Student

## 2022-06-14 ENCOUNTER — Ambulatory Visit (INDEPENDENT_AMBULATORY_CARE_PROVIDER_SITE_OTHER): Payer: Medicare HMO | Admitting: Student

## 2022-06-14 VITALS — BP 118/57 | HR 70 | Ht 63.0 in | Wt 206.4 lb

## 2022-06-14 DIAGNOSIS — F603 Borderline personality disorder: Secondary | ICD-10-CM

## 2022-06-14 DIAGNOSIS — F4310 Post-traumatic stress disorder, unspecified: Secondary | ICD-10-CM

## 2022-06-14 DIAGNOSIS — F331 Major depressive disorder, recurrent, moderate: Secondary | ICD-10-CM | POA: Diagnosis not present

## 2022-06-14 DIAGNOSIS — F419 Anxiety disorder, unspecified: Secondary | ICD-10-CM | POA: Diagnosis not present

## 2022-06-14 DIAGNOSIS — F411 Generalized anxiety disorder: Secondary | ICD-10-CM | POA: Diagnosis not present

## 2022-06-14 DIAGNOSIS — F32A Depression, unspecified: Secondary | ICD-10-CM | POA: Insufficient documentation

## 2022-06-14 DIAGNOSIS — F319 Bipolar disorder, unspecified: Secondary | ICD-10-CM

## 2022-06-14 DIAGNOSIS — F605 Obsessive-compulsive personality disorder: Secondary | ICD-10-CM

## 2022-06-14 DIAGNOSIS — Z720 Tobacco use: Secondary | ICD-10-CM

## 2022-06-14 HISTORY — DX: Obsessive-compulsive personality disorder: F60.5

## 2022-06-14 MED ORDER — HYDROXYZINE HCL 25 MG PO TABS: 25.0000 mg | ORAL_TABLET | Freq: Two times a day (BID) | ORAL | 0 refills | Status: AC

## 2022-06-14 MED ORDER — GABAPENTIN 100 MG PO CAPS: 100.0000 mg | ORAL_CAPSULE | Freq: Every evening | ORAL | 0 refills | Status: DC | PRN

## 2022-06-14 MED ORDER — TRAZODONE HCL 50 MG PO TABS: 50.0000 mg | ORAL_TABLET | Freq: Every day | ORAL | 0 refills | Status: DC
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
## 2022-06-21 DIAGNOSIS — Z6837 Body mass index (BMI) 37.0-37.9, adult: Secondary | ICD-10-CM | POA: Diagnosis not present

## 2022-06-21 DIAGNOSIS — F17209 Nicotine dependence, unspecified, with unspecified nicotine-induced disorders: Secondary | ICD-10-CM | POA: Diagnosis not present

## 2022-06-21 DIAGNOSIS — J301 Allergic rhinitis due to pollen: Secondary | ICD-10-CM | POA: Diagnosis not present

## 2022-06-21 DIAGNOSIS — E1165 Type 2 diabetes mellitus with hyperglycemia: Secondary | ICD-10-CM | POA: Diagnosis not present

## 2022-07-13 ENCOUNTER — Encounter (HOSPITAL_COMMUNITY): Payer: Self-pay

## 2022-07-13 ENCOUNTER — Ambulatory Visit (HOSPITAL_COMMUNITY): Payer: Medicare HMO | Admitting: Mental Health

## 2022-07-26 ENCOUNTER — Encounter (HOSPITAL_COMMUNITY): Payer: Self-pay | Admitting: Student

## 2022-07-26 ENCOUNTER — Ambulatory Visit (INDEPENDENT_AMBULATORY_CARE_PROVIDER_SITE_OTHER): Payer: Medicare HMO | Admitting: Student

## 2022-07-26 VITALS — BP 132/62 | HR 71 | Ht 63.0 in | Wt 216.4 lb

## 2022-07-26 DIAGNOSIS — F331 Major depressive disorder, recurrent, moderate: Secondary | ICD-10-CM

## 2022-07-26 DIAGNOSIS — F4310 Post-traumatic stress disorder, unspecified: Secondary | ICD-10-CM | POA: Diagnosis not present

## 2022-07-26 DIAGNOSIS — F411 Generalized anxiety disorder: Secondary | ICD-10-CM | POA: Diagnosis not present

## 2022-07-26 DIAGNOSIS — Z720 Tobacco use: Secondary | ICD-10-CM

## 2022-07-26 DIAGNOSIS — F6089 Other specific personality disorders: Secondary | ICD-10-CM | POA: Diagnosis not present

## 2022-07-26 DIAGNOSIS — F321 Major depressive disorder, single episode, moderate: Secondary | ICD-10-CM

## 2022-07-26 DIAGNOSIS — F1290 Cannabis use, unspecified, uncomplicated: Secondary | ICD-10-CM | POA: Diagnosis not present

## 2022-07-26 DIAGNOSIS — F605 Obsessive-compulsive personality disorder: Secondary | ICD-10-CM

## 2022-07-26 MED ORDER — SERTRALINE HCL 25 MG PO TABS: 25.0000 mg | ORAL_TABLET | Freq: Every day | ORAL | 0 refills | Status: DC
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
## 2022-07-26 NOTE — Patient Instructions (Signed)
For zoloft: Please take 1 tablet of zoloft for 2 weeks. Afterwards, please take 2 tablets for the remainder of the 2 weeks

## 2022-08-08 DIAGNOSIS — E1165 Type 2 diabetes mellitus with hyperglycemia: Secondary | ICD-10-CM | POA: Diagnosis not present

## 2022-08-08 DIAGNOSIS — F17209 Nicotine dependence, unspecified, with unspecified nicotine-induced disorders: Secondary | ICD-10-CM | POA: Diagnosis not present

## 2022-08-08 DIAGNOSIS — F411 Generalized anxiety disorder: Secondary | ICD-10-CM | POA: Diagnosis not present

## 2022-08-08 DIAGNOSIS — Z0000 Encounter for general adult medical examination without abnormal findings: Secondary | ICD-10-CM | POA: Diagnosis not present

## 2022-08-08 DIAGNOSIS — E559 Vitamin D deficiency, unspecified: Secondary | ICD-10-CM | POA: Diagnosis not present

## 2022-08-08 DIAGNOSIS — E782 Mixed hyperlipidemia: Secondary | ICD-10-CM | POA: Diagnosis not present

## 2022-08-08 DIAGNOSIS — Z89511 Acquired absence of right leg below knee: Secondary | ICD-10-CM | POA: Diagnosis not present

## 2022-08-08 DIAGNOSIS — E039 Hypothyroidism, unspecified: Secondary | ICD-10-CM | POA: Diagnosis not present

## 2022-08-08 DIAGNOSIS — F603 Borderline personality disorder: Secondary | ICD-10-CM | POA: Diagnosis not present

## 2022-08-09 ENCOUNTER — Other Ambulatory Visit: Payer: Self-pay | Admitting: Family Medicine

## 2022-08-09 DIAGNOSIS — Z122 Encounter for screening for malignant neoplasm of respiratory organs: Secondary | ICD-10-CM

## 2022-08-30 ENCOUNTER — Encounter (HOSPITAL_COMMUNITY): Payer: Self-pay | Admitting: Student

## 2022-08-30 ENCOUNTER — Encounter (HOSPITAL_COMMUNITY): Payer: Self-pay

## 2022-08-30 ENCOUNTER — Ambulatory Visit (INDEPENDENT_AMBULATORY_CARE_PROVIDER_SITE_OTHER): Payer: Medicare HMO | Admitting: Mental Health

## 2022-08-30 ENCOUNTER — Ambulatory Visit (INDEPENDENT_AMBULATORY_CARE_PROVIDER_SITE_OTHER): Payer: Medicare HMO | Admitting: Student

## 2022-08-30 VITALS — BP 146/56 | HR 57 | Ht 63.0 in

## 2022-08-30 DIAGNOSIS — F603 Borderline personality disorder: Secondary | ICD-10-CM

## 2022-08-30 DIAGNOSIS — F411 Generalized anxiety disorder: Secondary | ICD-10-CM | POA: Diagnosis not present

## 2022-08-30 DIAGNOSIS — F605 Obsessive-compulsive personality disorder: Secondary | ICD-10-CM | POA: Diagnosis not present

## 2022-08-30 DIAGNOSIS — M069 Rheumatoid arthritis, unspecified: Secondary | ICD-10-CM | POA: Diagnosis not present

## 2022-08-30 DIAGNOSIS — F1290 Cannabis use, unspecified, uncomplicated: Secondary | ICD-10-CM

## 2022-08-30 DIAGNOSIS — F331 Major depressive disorder, recurrent, moderate: Secondary | ICD-10-CM

## 2022-08-30 DIAGNOSIS — F321 Major depressive disorder, single episode, moderate: Secondary | ICD-10-CM | POA: Diagnosis not present

## 2022-08-30 DIAGNOSIS — F4310 Post-traumatic stress disorder, unspecified: Secondary | ICD-10-CM | POA: Diagnosis not present

## 2022-08-30 DIAGNOSIS — F17200 Nicotine dependence, unspecified, uncomplicated: Secondary | ICD-10-CM

## 2022-08-30 DIAGNOSIS — F6089 Other specific personality disorders: Secondary | ICD-10-CM

## 2022-08-30 MED ORDER — SERTRALINE HCL 100 MG PO TABS: 100.0000 mg | ORAL_TABLET | Freq: Every morning | ORAL | 0 refills | Status: DC

## 2022-08-30 MED ORDER — TRAZODONE HCL 50 MG PO TABS: 25.0000 mg | ORAL_TABLET | Freq: Every evening | ORAL | 0 refills | Status: DC | PRN

## 2022-08-30 MED ORDER — HYDROXYZINE PAMOATE 25 MG PO CAPS: 25.0000 mg | ORAL_CAPSULE | Freq: Three times a day (TID) | ORAL | 0 refills | Status: DC | PRN
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
## 2022-10-04 ENCOUNTER — Ambulatory Visit (INDEPENDENT_AMBULATORY_CARE_PROVIDER_SITE_OTHER): Payer: Medicare HMO | Admitting: Mental Health

## 2022-10-04 DIAGNOSIS — F411 Generalized anxiety disorder: Secondary | ICD-10-CM

## 2022-10-04 DIAGNOSIS — F331 Major depressive disorder, recurrent, moderate: Secondary | ICD-10-CM | POA: Diagnosis not present

## 2022-10-04 DIAGNOSIS — F605 Obsessive-compulsive personality disorder: Secondary | ICD-10-CM
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
## 2022-10-18 ENCOUNTER — Encounter (HOSPITAL_COMMUNITY): Payer: Medicare HMO | Admitting: Student

## 2022-10-18 DIAGNOSIS — E119 Type 2 diabetes mellitus without complications: Secondary | ICD-10-CM | POA: Insufficient documentation

## 2022-10-18 DIAGNOSIS — J449 Chronic obstructive pulmonary disease, unspecified: Secondary | ICD-10-CM | POA: Insufficient documentation

## 2022-10-26 DIAGNOSIS — N184 Chronic kidney disease, stage 4 (severe): Secondary | ICD-10-CM | POA: Diagnosis not present

## 2022-10-26 DIAGNOSIS — Z794 Long term (current) use of insulin: Secondary | ICD-10-CM | POA: Diagnosis not present

## 2022-10-26 DIAGNOSIS — E782 Mixed hyperlipidemia: Secondary | ICD-10-CM | POA: Diagnosis not present

## 2022-10-26 DIAGNOSIS — E1122 Type 2 diabetes mellitus with diabetic chronic kidney disease: Secondary | ICD-10-CM | POA: Diagnosis not present

## 2022-10-26 DIAGNOSIS — E1165 Type 2 diabetes mellitus with hyperglycemia: Secondary | ICD-10-CM | POA: Diagnosis not present

## 2022-10-26 DIAGNOSIS — I10 Essential (primary) hypertension: Secondary | ICD-10-CM | POA: Diagnosis not present

## 2022-10-26 DIAGNOSIS — E039 Hypothyroidism, unspecified: Secondary | ICD-10-CM | POA: Diagnosis not present

## 2022-10-26 DIAGNOSIS — Z89511 Acquired absence of right leg below knee: Secondary | ICD-10-CM | POA: Diagnosis not present

## 2022-10-28 ENCOUNTER — Other Ambulatory Visit: Payer: Self-pay

## 2022-10-28 ENCOUNTER — Emergency Department (HOSPITAL_COMMUNITY)
Admission: EM | Admit: 2022-10-28 | Discharge: 2022-10-28 | Disposition: A | Discharge status: Home / Self Care | Payer: Medicare HMO | Attending: Emergency Medicine | Admitting: Emergency Medicine

## 2022-10-28 ENCOUNTER — Encounter (HOSPITAL_COMMUNITY): Payer: Self-pay

## 2022-10-28 DIAGNOSIS — J449 Chronic obstructive pulmonary disease, unspecified: Secondary | ICD-10-CM | POA: Diagnosis not present

## 2022-10-28 DIAGNOSIS — Z7984 Long term (current) use of oral hypoglycemic drugs: Secondary | ICD-10-CM | POA: Diagnosis not present

## 2022-10-28 DIAGNOSIS — F17200 Nicotine dependence, unspecified, uncomplicated: Secondary | ICD-10-CM | POA: Insufficient documentation

## 2022-10-28 DIAGNOSIS — I10 Essential (primary) hypertension: Secondary | ICD-10-CM | POA: Diagnosis not present

## 2022-10-28 DIAGNOSIS — B0221 Postherpetic geniculate ganglionitis: Secondary | ICD-10-CM | POA: Insufficient documentation

## 2022-10-28 DIAGNOSIS — B029 Zoster without complications: Secondary | ICD-10-CM | POA: Diagnosis not present

## 2022-10-28 DIAGNOSIS — Z794 Long term (current) use of insulin: Secondary | ICD-10-CM | POA: Diagnosis not present

## 2022-10-28 DIAGNOSIS — L299 Pruritus, unspecified: Secondary | ICD-10-CM | POA: Diagnosis not present

## 2022-10-28 DIAGNOSIS — B028 Zoster with other complications: Secondary | ICD-10-CM | POA: Diagnosis not present

## 2022-10-28 DIAGNOSIS — S0993XA Unspecified injury of face, initial encounter: Secondary | ICD-10-CM | POA: Diagnosis not present

## 2022-10-28 DIAGNOSIS — J45909 Unspecified asthma, uncomplicated: Secondary | ICD-10-CM | POA: Diagnosis not present

## 2022-10-28 DIAGNOSIS — E119 Type 2 diabetes mellitus without complications: Secondary | ICD-10-CM | POA: Insufficient documentation

## 2022-10-28 DIAGNOSIS — R21 Rash and other nonspecific skin eruption: Secondary | ICD-10-CM | POA: Diagnosis not present

## 2022-10-28 MED ORDER — ZIKS ARTHRITIS PAIN RELIEF 0.025-1-12 % EX CREA: 1.0000 | TOPICAL_CREAM | Freq: Four times a day (QID) | CUTANEOUS | 1 refills | Status: AC

## 2022-10-28 MED ORDER — ACYCLOVIR 400 MG PO TABS: 800.0000 mg | ORAL_TABLET | Freq: Every day | ORAL | 0 refills | Status: AC

## 2022-10-28 MED ORDER — FLUORESCEIN SODIUM 1 MG OP STRP
1.0000 | ORAL_STRIP | Freq: Once | OPHTHALMIC | Status: AC
  Administered 2022-10-28: 1 via OPHTHALMIC
  Filled 2022-10-28: qty 1

## 2022-10-28 MED ORDER — OXYCODONE-ACETAMINOPHEN 5-325 MG PO TABS
1.0000 | ORAL_TABLET | Freq: Once | ORAL | Status: AC
  Administered 2022-10-28: 1 via ORAL
  Filled 2022-10-28: qty 1

## 2022-10-28 MED ORDER — ONDANSETRON 4 MG PO TBDP
4.0000 mg | ORAL_TABLET | Freq: Once | ORAL | Status: AC
  Administered 2022-10-28: 4 mg via ORAL
  Filled 2022-10-28: qty 1

## 2022-10-28 MED ORDER — PREDNISONE 20 MG PO TABS: 60.0000 mg | ORAL_TABLET | Freq: Every day | ORAL | 0 refills | Status: DC

## 2022-10-28 MED ORDER — LIDOCAINE VISCOUS HCL 2 % MT SOLN: 15.0000 mL | OROMUCOSAL | 0 refills | Status: DC | PRN

## 2022-10-28 MED ORDER — ACYCLOVIR 200 MG PO CAPS
800.0000 mg | ORAL_CAPSULE | Freq: Once | ORAL | Status: AC
  Administered 2022-10-28: 800 mg via ORAL
  Filled 2022-10-28: qty 4

## 2022-10-28 MED ORDER — TETRACAINE HCL 0.5 % OP SOLN
2.0000 [drp] | Freq: Once | OPHTHALMIC | Status: AC
  Administered 2022-10-28: 2 [drp] via OPHTHALMIC
  Filled 2022-10-28: qty 4

## 2022-10-28 MED ORDER — OXYCODONE-ACETAMINOPHEN 5-325 MG PO TABS: 1.0000 | ORAL_TABLET | Freq: Four times a day (QID) | ORAL | 0 refills | Status: DC | PRN

## 2022-10-28 MED ORDER — PREDNISONE 20 MG PO TABS
60.0000 mg | ORAL_TABLET | Freq: Once | ORAL | Status: AC
  Administered 2022-10-28: 60 mg via ORAL
  Filled 2022-10-28: qty 3

## 2022-10-28 NOTE — Discharge Instructions (Addendum)
Please take the viral medication by mouth 5 times daily for the next 7 days, as well as the prednisone for the next 7 days, you can use the oral lidocaine solution for your mouth discomfort, topical skin solution to help with other skin discomfort, AVOID mouth, eyes with the capsaicin solution.   Please use Tylenol for pain.  You may use 1000 mg of Tylenol every 6 hours.  Not to exceed 4 g of Tylenol within 24 hours.  You can use the stronger pain medication in place of Tylenol above as needed for severe breakthrough pain.

## 2022-10-28 NOTE — ED Notes (Signed)
Patient given discharge instructions and follow up care. Patient verbalized understanding. Patient ambulatory out of ED by self. 

## 2022-10-28 NOTE — ED Notes (Signed)
Pt has mult small groups of painful fluid filled blisters on R side of face

## 2022-10-28 NOTE — ED Triage Notes (Signed)
Pt arrived via EMS, painful itchy rash to right side of face.

## 2022-10-28 NOTE — ED Provider Notes (Signed)
[place] EMERGENCY DEPARTMENT AT [facility] Provider Note   CSN: [id number] Arrival date & time: 10/28/22  1644     History  Chief Complaint  Patient presents with   Rash    Berenize Gerhold is a 57 y.o. female with past medical history significant for diabetes, obesity, asthma, hypertension, COPD, borderline personality disorder, tobacco use disorder who presents with concern for painful itchy rash on the right side of the face for the last 3 days.  Patient reports that she additionally has some drainage from the right eye but denies any vision changes.  She reports some lesions inside the mouth and reports that it makes it difficult to eat food.  She additionally endorses some pain on the external ear but denies any facial droop, sensory deficits other than some tingling around her chin, and denies any hearing loss.  She denies any drainage from the ear.  She denies any recent new contacts.  She does report history of previous shingles.  She does not have the shingles vaccine.   Rash      Home Medications Prior to Admission medications   Medication Sig Start Date End Date Taking? Authorizing Provider  acyclovir (ZOVIRAX) 400 MG tablet Take 2 tablets (800 mg total) by mouth 5 (five) times daily. 10/28/22  Yes Jabari Swoveland H, PA-C  Capsaicin-Menthol-Methyl Sal (CAPSAICIN-METHYL SAL-MENTHOL) 0.025-1-12 % CREA Apply 1 Dose topically in the morning, at noon, in the evening, and at bedtime. 10/28/22  Yes Tiffinie Caillier H, PA-C  lidocaine (XYLOCAINE) 2 % solution Use as directed 15 mLs in the mouth or throat as needed for mouth pain. You can swish and spit, but safe to swallow as well 10/28/22  Yes Kmarion Rawl H, PA-C  oxyCODONE-acetaminophen (PERCOCET/ROXICET) 5-325 MG tablet Take 1 tablet by mouth every 6 (six) hours as needed for severe pain. 10/28/22  Yes Alto Gandolfo H, PA-C  predniSONE (DELTASONE) 20 MG tablet Take 3 tablets (60 mg total) by mouth daily.  10/28/22  Yes Addysyn Fern H, PA-C  ACCU-CHEK GUIDE test strip UP TO 4 TIMES A DAY 05/13/22   [provider]  Accu-Chek Softclix Lancets lancets USE AS DIRECTED 4 TIMES A DAY 05/13/22   [provider]  acetaminophen (TYLENOL) 500 MG tablet Take 500 mg by mouth daily as needed.    [provider]  albuterol (PROVENTIL HFA;VENTOLIN HFA) 108 (90 BASE) MCG/ACT inhaler Inhale 2 puffs into the lungs every 6 (six) hours as needed for wheezing or shortness of breath. 11/08/11   Domenick Gong, MD  atorvastatin (LIPITOR) 40 MG tablet Take 40 mg by mouth every evening.    [provider]  blood glucose meter kit and supplies KIT Dispense based on patient and insurance preference. Use up to four times daily as directed. 05/13/22   Albertine Grates, MD  Cholecalciferol (VITAMIN D3) 50 MCG (2000 UT) capsule Take 2,000 Units by mouth daily.    [provider]  empagliflozin (JARDIANCE) 25 MG TABS tablet Take 25 mg by mouth daily. 06/12/22   [provider]  famotidine (PEPCID) 20 MG tablet Take 20 mg by mouth 2 (two) times daily.    [provider]  fluticasone (FLONASE) 50 MCG/ACT nasal spray Place 1-2 sprays into both nostrils daily as needed (for seasonal allergies).    [provider]  fluticasone-salmeterol (ADVAIR) 250-50 MCG/ACT AEPB Inhale 1 puff into the lungs in the morning and at bedtime.    [provider]  gabapentin (NEURONTIN) 100 MG  capsule Take 100-200 mg by mouth at bedtime. 09/26/22   [provider]  glipiZIDE (GLUCOTROL) 10 MG tablet Take 10 mg by mouth in the morning and at bedtime.    [provider]  hydrochlorothiazide (HYDRODIURIL) 12.5 MG tablet Take 1 tablet (12.5 mg total) by mouth daily. 05/16/22   Albertine Grates, MD  hydrOXYzine (ATARAX) 25 MG tablet Take 25 mg by mouth every 12 (twelve) hours as needed. 08/13/22   [provider]  insulin aspart (NOVOLOG) 100 UNIT/ML FlexPen Insulin  sliding scale: Blood sugar  120-150   3units                       151-200   4units                       201-250   7units                       251- 300  11units                       301-350   15uints                       351-400   20units                       >400         call MD immediately 05/13/22   Albertine Grates, MD  insulin detemir (LEVEMIR FLEXTOUCH) 100 UNIT/ML FlexPen Inject 10 Units into the skin daily. 05/13/22   Albertine Grates, MD  Insulin Pen Needle (PEN NEEDLES 3/16") 31G X 5 MM MISC For insulin dependent dm2 05/13/22   Albertine Grates, MD  levothyroxine (SYNTHROID, LEVOTHROID) 50 MCG tablet Take 50 mcg by mouth daily before breakfast.    [provider]  lisinopril (ZESTRIL) 2.5 MG tablet Take 2.5 mg by mouth daily.    [provider]  MUCINEX 600 MG 12 hr tablet Take 600 mg by mouth 2 (two) times daily as needed for cough or to loosen phlegm.    [provider]  Multiple Vitamins-Minerals (SENTRY ADULT PO) Sentry    [provider]  potassium chloride (MICRO-K) 10 MEQ CR capsule Take 20 mEq by mouth daily. 10/05/11   [provider]  sertraline (ZOLOFT) 100 MG tablet Take 1 tablet (100 mg total) by mouth in the morning. 08/30/22 09/29/22  [name], DO  traZODone (DESYREL) 50 MG tablet Take 0.5-1 tablets (25-50 mg total) by mouth at bedtime as needed for sleep. 08/30/22 09/29/22  [name], DO  spironolactone (ALDACTONE) 25 MG tablet Take 1 tablet (25 mg total) by mouth daily. 06/19/11 10/26/11  Devoria Albe, MD      Allergies    Lithium, Chantix [varenicline], Milk-related compounds, Other, Wellbutrin [bupropion], Abilify [aripiprazole], and Hydrocortisone    Review of Systems   Review of Systems  Skin:  Positive for rash.  All other systems reviewed and are negative.   Physical Exam Updated Vital Signs BP (!) 141/58   Pulse (!) 55   Temp 99 F (37.2 C) (Oral)   Resp 18   Ht 5\' 3"  (1.6 m)   Wt 97.5 kg   LMP 10/09/2014 (Exact Date)    SpO2 100%   BMI 38.09 kg/m  Physical Exam Vitals and nursing note reviewed.  Constitutional:  General: She is not in acute distress.    Appearance: Normal appearance.  HENT:     Head: Normocephalic and atraumatic.  Eyes:     General:        Right eye: No discharge.        Left eye: No discharge.     Comments: No evidence of dendritic lesion on fluorescein exam with slit lamp  Cardiovascular:     Rate and Rhythm: Normal rate and regular rhythm.  Pulmonary:     Effort: Pulmonary effort is normal. No respiratory distress.  Musculoskeletal:        General: No deformity.  Skin:    General: Skin is warm and dry.     Comments: Patient with red vesicular rash on the right side of the face not crossing the midline.  She does not have any rash on the tip of the right nose. Rash on the right zygomatic, extending to forehead, some rash on the pinna or right ear, vesicles, redness extend toward the right lower eyelid, but no evidence of scleral redness, no lesion on tip of nose  Neurological:     Mental Status: She is alert and oriented to person, place, and time.  Psychiatric:        Mood and Affect: Mood normal.        Behavior: Behavior normal.     ED Results / Procedures / Treatments   Labs (all labs ordered are listed, but only abnormal results are displayed) Labs Reviewed - No data to display  EKG None  Radiology No results found.  Procedures Procedures    Medications Ordered in ED Medications  tetracaine (PONTOCAINE) 0.5 % ophthalmic solution 2 drop (2 drops Right Eye Given 10/28/22 1847)  fluorescein ophthalmic strip 1 strip (1 strip Right Eye Given 10/28/22 1847)  ondansetron (ZOFRAN-ODT) disintegrating tablet 4 mg (4 mg Oral Given 10/28/22 1852)  oxyCODONE-acetaminophen (PERCOCET/ROXICET) 5-325 MG per tablet 1 tablet (1 tablet Oral Given 10/28/22 1958)  predniSONE (DELTASONE) tablet 60 mg (60 mg Oral Given 10/28/22 1958)  acyclovir (ZOVIRAX) 200 MG capsule 800 mg (800 mg  Oral Given 10/28/22 1958)    ED Course/ Medical Decision Making/ A&P                             Medical Decision Making Risk OTC drugs. Prescription drug management.   This patient is a 57 y.o. female who presents to the ED for concern of facial rash.   Differential diagnoses prior to evaluation: Herpes zoster with possible complications of zoster ophthalmicus, zoster oticus, versus contact dermatitis, vs less concern for SJS, TEN, versus other unilateral facial rash  Past Medical History / Social History / Additional history: Chart reviewed. Pertinent results include: diabetes, obesity, asthma, hypertension, COPD, borderline personality disorder, tobacco use disorder  Physical Exam: Physical exam performed. The pertinent findings include: No evidence of dendritic lesion on fluorescein exam with slit lamp   Patient with red vesicular rash on the right side of the face not crossing the midline.  She does not have any rash on the tip of the right nose. Rash on the right zygomatic, extending to forehead, some rash on the pinna or right ear, vesicles, redness extend toward the right lower eyelid, but no evidence of scleral redness, no lesion on tip of nose   Medications / Treatment: Patient given dose of acyclovir, Percocet, prednisone, Zofran in the emergency department, discharged with capsaicin cream, viscous lidocaine  for reported intraoral lesions, steroids given some evidence of your involvement, thankfully with no evidence of ophthalmic involvement   Disposition: After consideration of the diagnostic results and the patients response to treatment, I feel that patient with evidence of herpes ulcer infection of the face, no evidence of herpes ophthalmicus, she does have some ear involvement.  Will place on acyclovir, prednisone, encouraged eye doctor and ENT follow-up.   emergency department workup does not suggest an emergent condition requiring admission or immediate intervention  beyond what has been performed at this time. The plan is: as above. The patient is safe for discharge and has been instructed to return immediately for worsening symptoms, change in symptoms or any other concerns.  Final Clinical Impression(s) / ED Diagnoses Final diagnoses:  Herpes zoster oticus  Herpes zoster with other complication    Rx / DC Orders ED Discharge Orders          Ordered    acyclovir (ZOVIRAX) 400 MG tablet  5 times daily        10/28/22 1931    Capsaicin-Menthol-Methyl Sal (CAPSAICIN-METHYL SAL-MENTHOL) 0.025-1-12 % CREA  4 times daily        10/28/22 1931    predniSONE (DELTASONE) 20 MG tablet  Daily        10/28/22 1931    lidocaine (XYLOCAINE) 2 % solution  As needed        10/28/22 1931    oxyCODONE-acetaminophen (PERCOCET/ROXICET) 5-325 MG tablet  Every 6 hours PRN        10/28/22 1931              Comfort Iversen, Edyth Gunnels 10/28/22 2008    Lorre Nick, MD 10/28/22 2248

## 2022-11-14 ENCOUNTER — Telehealth (HOSPITAL_COMMUNITY): Payer: Self-pay | Admitting: *Deleted

## 2022-11-14 ENCOUNTER — Ambulatory Visit (INDEPENDENT_AMBULATORY_CARE_PROVIDER_SITE_OTHER): Payer: Medicare HMO | Admitting: Mental Health

## 2022-11-14 ENCOUNTER — Encounter (HOSPITAL_COMMUNITY): Payer: Self-pay

## 2022-11-14 DIAGNOSIS — F331 Major depressive disorder, recurrent, moderate: Secondary | ICD-10-CM | POA: Diagnosis not present

## 2022-11-14 DIAGNOSIS — F603 Borderline personality disorder: Secondary | ICD-10-CM

## 2022-11-14 DIAGNOSIS — F411 Generalized anxiety disorder: Secondary | ICD-10-CM

## 2022-11-14 DIAGNOSIS — F605 Obsessive-compulsive personality disorder: Secondary | ICD-10-CM

## 2022-11-14 DIAGNOSIS — F4310 Post-traumatic stress disorder, unspecified: Secondary | ICD-10-CM

## 2022-11-14 NOTE — Telephone Encounter (Signed)
Patient called asking for refills. She made a follow up appointment for July.

## 2022-11-14 NOTE — Progress Notes (Unsigned)
THERAPIST PROGRESS NOTE Virtual Visit via Video Note  I connected with Mallory Potts on 11/14/22 at  4:00 PM EDT by a video enabled telemedicine application and verified that I am speaking with the correct person using two identifiers.  Location: Patient: home address on file Provider: office    I discussed the limitations of evaluation and management by telemedicine and the availability of in person appointments. The patient expressed understanding and agreed to proceed.   I discussed the assessment and treatment plan with the patient. The patient was provided an opportunity to ask questions and all were answered. The patient agreed with the plan and demonstrated an understanding of the instructions.   The patient was advised to call back or seek an in-person evaluation if the symptoms worsen or if the condition fails to improve as anticipated.  I provided 45 minutes of non-face-to-face time during this encounter.   Mallory Potts, [facility]   Session Time: 4:08 am (45 minutes)  Participation Level: Active  Behavioral Response: CasualAlertIrritable  Type of Therapy: Individual Therapy  Treatment Goals addressed: STG: "More stable. Even keeled" Cassidey will increase mental stability AEB development of effective coping skills for moods with engagement in daily healthy habits within the next 90 days    STG: "My own place." Lyric will increase independence AEB development of decision making skills and independent living skills within the next 90 days.    ProgressTowards Goals: Progressing  Interventions: CBT and Supportive  Summary: Mallory Potts is a 57 y.o. female who presents with dx hx of MDD moderate, recurrent, GAD and OCD personality disorder. Mallory Potts presents for session alert and oriented; mood and affect adequate, irritable. Speech clear and coherent at normal rate and tone. Engaged and receptive. Shares not feeling well with currently holding shingles and shares for face to  itch and to be in pain. States this has caused difficulty sleeping and has not slept well in x 3 days and for this to be effecting her mental health with increased irritability and low mood. Shares hopeful of feeling better by end of the week in order to see family for her and siblings birthday. Shares increase in ability to get along with roommates. Explored ability to make appropriate decisions for her self and notes has had a friend move in her room with her. Shares outside of rash causing irritability for moods to have been stable. Denies S/HI. Use of coping skills and engaging in healthy habits reported. Ongoing work with independent living skills.   Suicidal/Homicidal: Nowith intent/plan  Therapist Response: Therapist engaged [name] in therapy session. Completed check in and assessed for current level of functioning, sxs management and level of stressors. Provided supportive feedback; validated feelings. Engaged Mallory Potts in exploring ability to manage stressors of medical concerns. Explored behaviors that may help to soothe and encouraged follow up with needed providers. Explored ability to cope and supported in identifying coping skills present. Explored dynamics with roommate and ability to navigate. Assessed for decision making and completing tasks independently. Reviewed session and provided follow up.  Plan: Return again in 6  weeks.  Diagnosis: Moderate episode of recurrent major depressive disorder (HCC)  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HCC)  Generalized anxiety disorder  Collaboration of Care: Other None  Patient/Guardian was advised Release of Information must be obtained prior to any record release in order to collaborate their care with an outside provider. Patient/Guardian was advised if they have not already done so to contact the registration department to sign all necessary  forms in order for [place] to release information regarding their care.   Consent: Patient/Guardian  gives verbal consent for treatment and assignment of benefits for services provided during this visit. Patient/Guardian expressed understanding and agreed to proceed.   Stephan Minister [place], [facility] 11/14/2022

## 2022-11-15 ENCOUNTER — Other Ambulatory Visit: Payer: Self-pay

## 2022-11-15 ENCOUNTER — Encounter (HOSPITAL_COMMUNITY): Payer: Self-pay

## 2022-11-15 ENCOUNTER — Emergency Department (HOSPITAL_COMMUNITY)
Admission: EM | Admit: 2022-11-15 | Discharge: 2022-11-15 | Disposition: A | Discharge status: Home / Self Care | Payer: Medicare HMO | Attending: Emergency Medicine | Admitting: Emergency Medicine

## 2022-11-15 DIAGNOSIS — E1122 Type 2 diabetes mellitus with diabetic chronic kidney disease: Secondary | ICD-10-CM | POA: Diagnosis not present

## 2022-11-15 DIAGNOSIS — Z7984 Long term (current) use of oral hypoglycemic drugs: Secondary | ICD-10-CM | POA: Diagnosis not present

## 2022-11-15 DIAGNOSIS — F17210 Nicotine dependence, cigarettes, uncomplicated: Secondary | ICD-10-CM | POA: Insufficient documentation

## 2022-11-15 DIAGNOSIS — J449 Chronic obstructive pulmonary disease, unspecified: Secondary | ICD-10-CM | POA: Diagnosis not present

## 2022-11-15 DIAGNOSIS — R6884 Jaw pain: Secondary | ICD-10-CM | POA: Diagnosis present

## 2022-11-15 DIAGNOSIS — Z7951 Long term (current) use of inhaled steroids: Secondary | ICD-10-CM | POA: Diagnosis not present

## 2022-11-15 DIAGNOSIS — K0889 Other specified disorders of teeth and supporting structures: Secondary | ICD-10-CM | POA: Insufficient documentation

## 2022-11-15 DIAGNOSIS — Z794 Long term (current) use of insulin: Secondary | ICD-10-CM | POA: Diagnosis not present

## 2022-11-15 DIAGNOSIS — N1832 Chronic kidney disease, stage 3b: Secondary | ICD-10-CM | POA: Diagnosis not present

## 2022-11-15 DIAGNOSIS — E039 Hypothyroidism, unspecified: Secondary | ICD-10-CM | POA: Insufficient documentation

## 2022-11-15 DIAGNOSIS — I129 Hypertensive chronic kidney disease with stage 1 through stage 4 chronic kidney disease, or unspecified chronic kidney disease: Secondary | ICD-10-CM | POA: Insufficient documentation

## 2022-11-15 DIAGNOSIS — Z79899 Other long term (current) drug therapy: Secondary | ICD-10-CM | POA: Diagnosis not present

## 2022-11-15 LAB — CBG MONITORING, ED: Glucose-Capillary: 188 mg/dL — ABNORMAL HIGH (ref 70–99)

## 2022-11-15 MED ORDER — TRAZODONE HCL 50 MG PO TABS
25.0000 mg | ORAL_TABLET | Freq: Every evening | ORAL | 0 refills | Status: AC | PRN
Start: 2022-11-15 — End: 2022-11-30

## 2022-11-15 MED ORDER — PENICILLIN V POTASSIUM 500 MG PO TABS: 500.0000 mg | ORAL_TABLET | Freq: Four times a day (QID) | ORAL | 0 refills | Status: AC

## 2022-11-15 MED ORDER — HYDROXYZINE HCL 25 MG PO TABS: 25.0000 mg | ORAL_TABLET | Freq: Two times a day (BID) | ORAL | 0 refills | Status: AC | PRN

## 2022-11-15 MED ORDER — SERTRALINE HCL 100 MG PO TABS
100.0000 mg | ORAL_TABLET | Freq: Every morning | ORAL | 0 refills | Status: DC
Start: 2022-11-15 — End: 2022-11-30

## 2022-11-15 MED ORDER — CHLORHEXIDINE GLUCONATE 0.12 % MT SOLN: 15.0000 mL | Freq: Two times a day (BID) | OROMUCOSAL | 0 refills | Status: AC

## 2022-11-15 NOTE — Telephone Encounter (Signed)
Refilled her zoloft, trazodone, and vistaril to bridge her until the next appointment on 11/30/2022

## 2022-11-15 NOTE — ED Provider Notes (Signed)
[place] EMERGENCY DEPARTMENT AT [facility] Provider Note   CSN: [id number] Arrival date & time: 11/15/22  1557     History   Chief Complaint Chief Complaint  Patient presents with   Jaw pain    HPI Mallory Potts is a 57 y.o. female.  Patient presents to the emergency department with a dental complaint. Symptoms began 20 days ago at which time she was found to have a zoster rash on the right side of her face and was treated for this.  She states the rash is gone away however she states that the pain in her right jaw has changed and seems to be more in her gumline now.  She states that it does seem that is a different type of pain the patient has tried to alleviate pain with Tylenol.  Pain rated as severe, characterized as throbbing in nature and located right gumline. Patient denies fever, night sweats, chills, difficulty swallowing or opening mouth, SOB, nuchal rigidity or decreased ROM of neck.  Patient does not have a dentist and requests a resource guide at discharge.      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   OCD (obsessive compulsive disorder)    Peripheral edema    Seizures (HCC)    Thyroid disease    Tobacco use disorder 05/09/2022    Patient Active Problem List   Diagnosis Date Noted   Chronic obstructive lung disease (HCC) 10/18/2022   Type 2 diabetes mellitus (HCC) 10/18/2022   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   MDD 06/14/2022   Right lower lobe pneumonia 05/09/2022   Normocytic anemia 05/09/2022   Class II obesity 05/09/2022   Aortic atherosclerosis (HCC) 05/09/2022    Tobacco use disorder 05/09/2022   Generalized anxiety disorder 03/13/2022   PTSD (post-traumatic stress disorder) 03/13/2022   Long term (current) use of non-steroidal anti-inflammatories (nsaid) 08/08/2021   Proteinuria 08/08/2021   Microalbuminuria 06/29/2021   Stage 3b chronic kidney disease (HCC) 06/29/2021   Chronic kidney disease 08/20/2020   Hypothyroidism 08/20/2020   Insomnia 08/20/2020   Morbid obesity (HCC) 08/20/2020   Osteoarthritis 08/20/2020   Peripheral neuropathic pain 08/20/2020   Rheumatoid arthritis (HCC) 08/20/2020   Seizure (HCC) 11/13/2014   Acute renal failure (HCC) 10/29/2014   Cellulitis of foot 10/24/2014   Hyponatremia 10/24/2014   Hypokalemia 10/24/2014   DM (diabetes mellitus), type 2, uncontrolled    Epilepsy (HCC)    COPD (chronic obstructive pulmonary disease) (HCC)    Hypertension    Essential hypertension    Other epilepsy without status epilepticus, not intractable (HCC)     Past Surgical History:  Procedure Laterality Date   AMPUTATION Right 10/24/2014   Procedure: AMPUTATION RAY RIGHT SECOND TOE;  Surgeon: Beverely Low, MD;  Location: [facility] ORS;  Service: Orthopedics;  Laterality: Right;   AMPUTATION Right 10/27/2014   Procedure: RIGHT BELOW KNEE AMPUTATION;  Surgeon: Toni Arthurs, MD;  Location: [facility] OR;  Service: Orthopedics;  Laterality: Right;   KNEE ARTHROSCOPY     TONSILLECTOMY       OB History  No obstetric history on file.      Home Medications    Prior to Admission medications   Medication Sig Start Date End Date Taking? Authorizing Provider  chlorhexidine (PERIDEX) 0.12 % solution Use as directed 15 mLs in the mouth or throat 2 (two) times daily. 11/15/22  Yes Shalita Notte, Stevphen Meuse S, PA  penicillin v potassium (VEETID) 500 MG tablet Take 1 tablet (500 mg total) by mouth 4 (four) times daily for 10 days. 11/15/22 11/25/22 Yes Giuseppina Quinones S, PA  ACCU-CHEK GUIDE test strip UP TO 4 TIMES A DAY 05/13/22   [provider]  Accu-Chek  Softclix Lancets lancets USE AS DIRECTED 4 TIMES A DAY 05/13/22   [provider]  acetaminophen (TYLENOL) 500 MG tablet Take 500 mg by mouth daily as needed.    [provider]  acyclovir (ZOVIRAX) 400 MG tablet Take 2 tablets (800 mg total) by mouth 5 (five) times daily. 10/28/22   Prosperi, Christian H, PA-C  albuterol (PROVENTIL HFA;VENTOLIN HFA) 108 (90 BASE) MCG/ACT inhaler Inhale 2 puffs into the lungs every 6 (six) hours as needed for wheezing or shortness of breath. 11/08/11   Domenick Gong, MD  atorvastatin (LIPITOR) 40 MG tablet Take 40 mg by mouth every evening.    [provider]  blood glucose meter kit and supplies KIT Dispense based on patient and insurance preference. Use up to four times daily as directed. 05/13/22   Albertine Grates, MD  Capsaicin-Menthol-Methyl Sal (CAPSAICIN-METHYL SAL-MENTHOL) 0.025-1-12 % CREA Apply 1 Dose topically in the morning, at noon, in the evening, and at bedtime. 10/28/22   Prosperi, Christian H, PA-C  Cholecalciferol (VITAMIN D3) 50 MCG (2000 UT) capsule Take 2,000 Units by mouth daily.    [provider]  empagliflozin (JARDIANCE) 25 MG TABS tablet Take 25 mg by mouth daily. 06/12/22   [provider]  famotidine (PEPCID) 20 MG tablet Take 20 mg by mouth 2 (two) times daily.    [provider]  fluticasone (FLONASE) 50 MCG/ACT nasal spray Place 1-2 sprays into both nostrils daily as needed (for seasonal allergies).    [provider]  fluticasone-salmeterol (ADVAIR) 250-50 MCG/ACT AEPB Inhale 1 puff into the lungs in the morning and at bedtime.    [provider]  gabapentin (NEURONTIN) 100 MG capsule Take 100-200 mg by mouth at bedtime. 09/26/22   [provider]  glipiZIDE (GLUCOTROL) 10 MG tablet Take 10 mg by mouth in the morning and at bedtime.    [provider]  hydrochlorothiazide (HYDRODIURIL) 12.5 MG tablet Take 1 tablet (12.5 mg total) by mouth daily. 05/16/22    Albertine Grates, MD  hydrOXYzine (ATARAX) 25 MG tablet Take 1 tablet (25 mg total) by mouth every 12 (twelve) hours as needed for up to 15 days. 11/15/22 11/30/22  [name], DO  insulin aspart (NOVOLOG) 100 UNIT/ML FlexPen Insulin sliding scale: Blood sugar  120-150   3units                       151-200   4units                       201-250   7units                       251- 300  11units  301-350   15uints                       351-400   20units                       >400         call MD immediately 05/13/22   Albertine Grates, MD  insulin detemir (LEVEMIR FLEXTOUCH) 100 UNIT/ML FlexPen Inject 10 Units into the skin daily. 05/13/22   Albertine Grates, MD  Insulin Pen Needle (PEN NEEDLES 3/16") 31G X 5 MM MISC For insulin dependent dm2 05/13/22   Albertine Grates, MD  levothyroxine (SYNTHROID, LEVOTHROID) 50 MCG tablet Take 50 mcg by mouth daily before breakfast.    [provider]  lidocaine (XYLOCAINE) 2 % solution Use as directed 15 mLs in the mouth or throat as needed for mouth pain. You can swish and spit, but safe to swallow as well 10/28/22   Prosperi, Christian H, PA-C  lisinopril (ZESTRIL) 2.5 MG tablet Take 2.5 mg by mouth daily.    [provider]  MUCINEX 600 MG 12 hr tablet Take 600 mg by mouth 2 (two) times daily as needed for cough or to loosen phlegm.    [provider]  Multiple Vitamins-Minerals (SENTRY ADULT PO) Sentry    [provider]  oxyCODONE-acetaminophen (PERCOCET/ROXICET) 5-325 MG tablet Take 1 tablet by mouth every 6 (six) hours as needed for severe pain. 10/28/22   Prosperi, Christian H, PA-C  potassium chloride (MICRO-K) 10 MEQ CR capsule Take 20 mEq by mouth daily. 10/05/11   [provider]  predniSONE (DELTASONE) 20 MG tablet Take 3 tablets (60 mg total) by mouth daily. 10/28/22   Prosperi, Christian H, PA-C  sertraline (ZOLOFT) 100 MG tablet Take 1 tablet (100 mg total) by mouth in the morning for 15 days. 11/15/22 11/30/22   [name], DO  traZODone (DESYREL) 50 MG tablet Take 0.5-1 tablets (25-50 mg total) by mouth at bedtime as needed for up to 15 days for sleep. 11/15/22 11/30/22  [name], DO  spironolactone (ALDACTONE) 25 MG tablet Take 1 tablet (25 mg total) by mouth daily. 06/19/11 10/26/11  Devoria Albe, MD    Family History Family History  Problem Relation Age of Onset   Asthma Other    Diabetes Other    Hypertension Other     Social History Social History   Tobacco Use   Smoking status: Every Day    Packs/day: 0.50    Years: 5.00    Additional pack years: 0.00    Total pack years: 2.50    Types: Cigarettes  Substance Use Topics   Alcohol use: No    Comment: OCASSIONALLY   Drug use: No     Allergies   Lithium, Chantix [varenicline], Milk-related compounds, Other, Wellbutrin [bupropion], Abilify [aripiprazole], and Hydrocortisone   Review of Systems Denies fevers, chills, difficulty swallowing or eating, changes in voice, pain under tongue, nausea, vomiting, lightheadedness or dizziness. No trismus   Physical Exam Updated Vital Signs BP (!) 146/76 (BP Location: Right Arm)   Pulse 96   Temp 98.2 F (36.8 C) (Oral)   Resp 18   Ht 5\' 3"  (1.6 m)   Wt 93.4 kg   LMP 10/09/2014 (Exact Date)   SpO2 96%   BMI 36.49 kg/m   Physical Exam Physical Exam  Constitutional: Pt appears well-developed and well-nourished.  HENT:  Head: Normocephalic.  Right Ear: Tympanic membrane, external  ear and ear canal normal.  Left Ear: Tympanic membrane, external ear and ear canal normal.  Nose: Nose normal. Right sinus exhibits no maxillary sinus tenderness and no frontal sinus tenderness. Left sinus exhibits no maxillary sinus tenderness and no frontal sinus tenderness.  Mouth/Throat: Uvula is midline, oropharynx is clear and moist and mucous membranes are normal. No oral lesions. No uvula swelling or lacerations. No oropharyngeal exudate, posterior oropharyngeal edema, posterior oropharyngeal  erythema or tonsillar abscesses.  Poor dentition No gingival swelling, fluctuance or induration No gross abscess  No sublingual edema, tenderness to palpation, or sign of Ludwig's angina, or deep space infection Pain at right lower gumline -- no focal abscess seen. Poor dentition.  Eyes: Conjunctivae are normal. Pupils are equal, round, and reactive to light. Right eye exhibits no discharge. Left eye exhibits no discharge.  Neck: Normal range of motion. Neck supple.  No stridor Handling secretions without difficulty No nuchal rigidity No cervical lymphadenopathy Cardiovascular: Normal rate, regular rhythm and normal heart sounds.   Pulmonary/Chest: Effort normal. No respiratory distress.  Equal chest rise  Abdominal: Soft. Bowel sounds are normal. Pt exhibits no distension. There is no tenderness.  Lymphadenopathy: Pt has no cervical adenopathy.  Neurological: Pt is alert and oriented x 4  Skin: Skin is warm and dry.  Psychiatric: Pt has a normal mood and affect.  Nursing note and vitals reviewed.   ED Treatments / Results  Labs (all labs ordered are listed, but only abnormal results are displayed) Labs Reviewed  CBG MONITORING, ED - Abnormal; Notable for the following components:      Result Value   Glucose-Capillary 188 (*)    All other components within normal limits    EKG    Radiology No results found.  Procedures Procedures (including critical care time)  Medications Ordered in ED Medications - No data to display   Initial Impression / Assessment and Plan / ED Course  I have reviewed the triage vital signs and the nursing notes.  Pertinent labs & imaging results that were available during my care of the patient were reviewed by me and considered in my medical decision making (see chart for details).    Patient presents to the emergency department with a dental complaint. Symptoms began 20 days ago at which time she was found to have a zoster rash on the right  side of her face and was treated for this.  She states the rash is gone away however she states that the pain in her right jaw has changed and seems to be more in her gumline now.  She states that it does seem that is a different type of pain the patient has tried to alleviate pain with Tylenol.  Pain rated as severe, characterized as throbbing in nature and located right gumline. Patient denies fever, night sweats, chills, difficulty swallowing or opening mouth, SOB, nuchal rigidity or decreased ROM of neck.  Patient does not have a dentist and requests a resource guide at discharge.    Patient with dentalgia.  No abscess requiring immediate incision and drainage.  Exam not concerning for Ludwig's angina or pharyngeal abscess.  Will treat with penicillin and recommend close follow-up with dentist.  Discussed return precautions. Pt safe for discharge.  I provided patient with dentist to follow-up with. Will cover for dental infection with penicillin and Peridex.  This certainly could be TMJ as she does have some catching and popping that she describes to me.  However she is indicating not  her TMJ joint as the area of pain is there could be some radicular component to this I do think that it is prudent to carry a high index of suspicion for dental infection.  She understands the next step for her will be following up with primary care and dentistry  Final Clinical Impressions(s) / ED Diagnoses   Final diagnoses:  Pain, dental    ED Discharge Orders          Ordered    penicillin v potassium (VEETID) 500 MG tablet  4 times daily        11/15/22 1831    chlorhexidine (PERIDEX) 0.12 % solution  2 times daily        11/15/22 1831              [place], [place] 11/15/22 1833    Arby Barrette, MD 11/21/22 [phone]

## 2022-11-15 NOTE — ED Triage Notes (Signed)
Pt came in via POV d/t Rt jaw pain that began on the 6th of June & pt reports she was seen at another facility for this & told she had shingles. Rates her jaw pain 9/10 while in triage, endorses swollen gums, denies any recent fevers.

## 2022-11-15 NOTE — Discharge Instructions (Addendum)
Since your rash is improving and because you are seemingly having more dental pain and face pain at this time I am to go ahead and treat your symptoms as dental pain.  Either way however I would like you to follow-up with your primary care provider.  Take antibiotics as prescribed, follow-up with dentistry, Tylenol ibuprofen as discussed below for pain Please use Tylenol or ibuprofen for pain.  You may use 600 mg ibuprofen every 6 hours or 1000 mg of Tylenol every 6 hours.  You may choose to alternate between the 2.  This would be most effective.  Not to exceed 4 g of Tylenol within 24 hours.  Not to exceed 3200 mg ibuprofen 24 hours.

## 2022-11-30 ENCOUNTER — Telehealth (HOSPITAL_COMMUNITY): Payer: Medicare HMO | Admitting: Student

## 2022-11-30 ENCOUNTER — Encounter (HOSPITAL_COMMUNITY): Payer: Self-pay | Admitting: Student

## 2022-11-30 DIAGNOSIS — F17200 Nicotine dependence, unspecified, uncomplicated: Secondary | ICD-10-CM

## 2022-11-30 DIAGNOSIS — F411 Generalized anxiety disorder: Secondary | ICD-10-CM

## 2022-11-30 DIAGNOSIS — F605 Obsessive-compulsive personality disorder: Secondary | ICD-10-CM

## 2022-11-30 DIAGNOSIS — F331 Major depressive disorder, recurrent, moderate: Secondary | ICD-10-CM

## 2022-11-30 DIAGNOSIS — F1290 Cannabis use, unspecified, uncomplicated: Secondary | ICD-10-CM | POA: Insufficient documentation

## 2022-11-30 DIAGNOSIS — F4310 Post-traumatic stress disorder, unspecified: Secondary | ICD-10-CM

## 2022-11-30 DIAGNOSIS — F603 Borderline personality disorder: Secondary | ICD-10-CM

## 2022-11-30 MED ORDER — TRAZODONE HCL 50 MG PO TABS
25.0000 mg | ORAL_TABLET | Freq: Every evening | ORAL | 0 refills | Status: DC | PRN
Start: 2022-11-30 — End: 2022-12-29

## 2022-11-30 MED ORDER — SERTRALINE HCL 100 MG PO TABS
100.0000 mg | ORAL_TABLET | Freq: Every morning | ORAL | 0 refills | Status: DC
Start: 2022-11-30 — End: 2022-12-29
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
## 2022-12-08 ENCOUNTER — Telehealth (HOSPITAL_COMMUNITY): Payer: Self-pay

## 2022-12-08 NOTE — Telephone Encounter (Signed)
Patient is calling to see if she can get a letter for an emotional support animal. She has a dog that she says is her emotional support and she needs a letter because she is moving and needs to take him with her. Please review and advise, thank you

## 2022-12-13 ENCOUNTER — Encounter (HOSPITAL_COMMUNITY): Payer: Self-pay | Admitting: Student

## 2022-12-22 ENCOUNTER — Ambulatory Visit (HOSPITAL_COMMUNITY): Payer: Medicare HMO | Admitting: Mental Health

## 2022-12-22 DIAGNOSIS — F331 Major depressive disorder, recurrent, moderate: Secondary | ICD-10-CM | POA: Diagnosis not present

## 2022-12-22 DIAGNOSIS — F411 Generalized anxiety disorder: Secondary | ICD-10-CM

## 2022-12-22 DIAGNOSIS — F605 Obsessive-compulsive personality disorder: Secondary | ICD-10-CM

## 2022-12-22 NOTE — Progress Notes (Unsigned)
THERAPIST PROGRESS NOTE Virtual Visit via Telephone Note  I connected with Mallory Potts on 12/22/22 at 10:00 AM EDT by telephone and verified that I am speaking with the correct person using two identifiers.  Location: Patient: home address on file Provider: home office   I discussed the limitations, risks, security and privacy concerns of performing an evaluation and management service by telephone and the availability of in person appointments. I also discussed with the patient that there may be a patient responsible charge related to this service. The patient expressed understanding and agreed to proceed.   I discussed the assessment and treatment plan with the patient. The patient was provided an opportunity to ask questions and all were answered. The patient agreed with the plan and demonstrated an understanding of the instructions.   The patient was advised to call back or seek an in-person evaluation if the symptoms worsen or if the condition fails to improve as anticipated.  I provided 36 minutes of non-face-to-face time during this encounter.   [name], [place]    Session Time: 10:03 am (36 minutes)   Participation Level: Active  Behavioral Response: CasualAlertEuthymic  Type of Therapy: Individual Therapy  Treatment Goals addressed:  STG: "More stable. Even keeled" Mallory Potts will increase mental stability AEB development of effective coping skills for moods with engagement in daily healthy habits within the next 90 days    STG: "My own place." Mallory Potts will increase independence AEB development of decision making skills and independent living skills within the next 90 days.    ProgressTowards Goals: Progressing  Interventions: CBT and Supportive  Summary: Mallory Potts is a 57 y.o. female who presents with dx hx of MDD moderate, recurrent, GAD and OCD personality disorder. Mallory Potts presents for session alert and oriented; mood and affect adequate,stable. Speech clear  and coherent at normal rate and tone. Engaged and receptive. Shares some feelings of stress and overwhelm currently with in process of moving. Notes discord in current home and feeling better to move and reside with other roommates of the home who have also decided to move. Shares high degree of things to do and concerned for ability to secure truck to move. States use of coping skills with deep breathing and working to take things one day at a time. Shares to feel this move will be a good thing for her. Notes also coping with coping thoughts "Everything always works outNort[employer] reduced appetite noting "It is too hot to eat." But improvements with sleep. Shares to be medication compliant. Shares use of coping skills supportive with keeping moods managed. Shares to feel as if this is a good decision for her and her independence. Denies SI/HI  Suicidal/Homicidal: Nowithout intent/plan  Therapist Response: Therapist engaged Mallory Potts in therapy session. Completed check in and assessed for current level of functioning, sxs management and level of stressors. Provided supportive feedback; validated feelings. Engaged Mallory Potts in exploring ability to manage stressors related to move. Provided safe space to share concerns related to factors that contributing to desire to move. Explored benefits of moving and progress towards ability to make appropriate decisions for herself and increase independence and self- reliance as well as better boundaries with others. Provided supportive feedback. Assessed for appetite and sleep and use of coping skills for moods; engagement in healthy habits. Reviewed session provided follow up.   Plan: Return again in x 6 weeks.  Diagnosis: Moderate episode of recurrent major depressive disorder (HCC)  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HCC)  Generalized  anxiety disorder  Collaboration of Care: Other None  Patient/Guardian was advised Release of Information must be obtained prior  to any record release in order to collaborate their care with an outside provider. Patient/Guardian was advised if they have not already done so to contact the registration department to sign all necessary forms in order for [place] to release information regarding their care.   Consent: Patient/Guardian gives verbal consent for treatment and assignment of benefits for services provided during this visit. Patient/Guardian expressed understanding and agreed to proceed.   Stephan Minister Crane, [facility] 12/22/2022

## 2022-12-29 ENCOUNTER — Telehealth (INDEPENDENT_AMBULATORY_CARE_PROVIDER_SITE_OTHER): Payer: Medicare HMO | Admitting: Student

## 2022-12-29 DIAGNOSIS — F4310 Post-traumatic stress disorder, unspecified: Secondary | ICD-10-CM

## 2022-12-29 DIAGNOSIS — F605 Obsessive-compulsive personality disorder: Secondary | ICD-10-CM

## 2022-12-29 DIAGNOSIS — F17210 Nicotine dependence, cigarettes, uncomplicated: Secondary | ICD-10-CM | POA: Diagnosis not present

## 2022-12-29 DIAGNOSIS — F1210 Cannabis abuse, uncomplicated: Secondary | ICD-10-CM | POA: Diagnosis not present

## 2022-12-29 DIAGNOSIS — F411 Generalized anxiety disorder: Secondary | ICD-10-CM

## 2022-12-29 DIAGNOSIS — F603 Borderline personality disorder: Secondary | ICD-10-CM

## 2022-12-29 DIAGNOSIS — F331 Major depressive disorder, recurrent, moderate: Secondary | ICD-10-CM

## 2022-12-29 MED ORDER — SERTRALINE HCL 100 MG PO TABS: 100.0000 mg | ORAL_TABLET | Freq: Every morning | ORAL | 1 refills | Status: DC

## 2022-12-29 MED ORDER — TRAZODONE HCL 50 MG PO TABS
25.0000 mg | ORAL_TABLET | Freq: Every evening | ORAL | 0 refills | Status: DC | PRN
Start: 2022-12-29 — End: 2023-02-08
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
## 2023-01-02 ENCOUNTER — Emergency Department (HOSPITAL_COMMUNITY)
Admission: EM | Admit: 2023-01-02 | Discharge: 2023-01-02 | Disposition: A | Discharge status: Home / Self Care | Payer: Medicare HMO | Attending: Emergency Medicine | Admitting: Emergency Medicine

## 2023-01-02 ENCOUNTER — Emergency Department (HOSPITAL_COMMUNITY): Payer: Medicare HMO

## 2023-01-02 ENCOUNTER — Ambulatory Visit (HOSPITAL_COMMUNITY)
Admission: EM | Admit: 2023-01-02 | Discharge: 2023-01-02 | Disposition: A | Discharge status: Home / Self Care | Payer: Medicare HMO

## 2023-01-02 ENCOUNTER — Other Ambulatory Visit: Payer: Self-pay

## 2023-01-02 ENCOUNTER — Encounter (HOSPITAL_COMMUNITY): Payer: Self-pay

## 2023-01-02 ENCOUNTER — Encounter (HOSPITAL_COMMUNITY): Payer: Self-pay | Admitting: Emergency Medicine

## 2023-01-02 DIAGNOSIS — K029 Dental caries, unspecified: Secondary | ICD-10-CM | POA: Diagnosis not present

## 2023-01-02 DIAGNOSIS — R220 Localized swelling, mass and lump, head: Secondary | ICD-10-CM

## 2023-01-02 DIAGNOSIS — R499 Unspecified voice and resonance disorder: Secondary | ICD-10-CM

## 2023-01-02 DIAGNOSIS — L0201 Cutaneous abscess of face: Secondary | ICD-10-CM

## 2023-01-02 DIAGNOSIS — Z7984 Long term (current) use of oral hypoglycemic drugs: Secondary | ICD-10-CM | POA: Insufficient documentation

## 2023-01-02 DIAGNOSIS — N1832 Chronic kidney disease, stage 3b: Secondary | ICD-10-CM | POA: Insufficient documentation

## 2023-01-02 DIAGNOSIS — K047 Periapical abscess without sinus: Secondary | ICD-10-CM | POA: Insufficient documentation

## 2023-01-02 DIAGNOSIS — E1122 Type 2 diabetes mellitus with diabetic chronic kidney disease: Secondary | ICD-10-CM | POA: Insufficient documentation

## 2023-01-02 DIAGNOSIS — Z794 Long term (current) use of insulin: Secondary | ICD-10-CM | POA: Insufficient documentation

## 2023-01-02 DIAGNOSIS — M069 Rheumatoid arthritis, unspecified: Secondary | ICD-10-CM | POA: Diagnosis not present

## 2023-01-02 DIAGNOSIS — D72829 Elevated white blood cell count, unspecified: Secondary | ICD-10-CM | POA: Insufficient documentation

## 2023-01-02 LAB — COMPREHENSIVE METABOLIC PANEL
ALT: 15 U/L (ref 0–44)
AST: 22 U/L (ref 15–41)
Albumin: 3.1 g/dL — ABNORMAL LOW (ref 3.5–5.0)
Alkaline Phosphatase: 53 U/L (ref 38–126)
Anion gap: 13 (ref 5–15)
BUN: 34 mg/dL — ABNORMAL HIGH (ref 6–20)
CO2: 20 mmol/L — ABNORMAL LOW (ref 22–32)
Calcium: 8.8 mg/dL — ABNORMAL LOW (ref 8.9–10.3)
Chloride: 100 mmol/L (ref 98–111)
Creatinine, Ser: 1.89 mg/dL — ABNORMAL HIGH (ref 0.44–1.00)
GFR, Estimated: 31 mL/min — ABNORMAL LOW (ref >60)
Glucose, Bld: 205 mg/dL — ABNORMAL HIGH (ref 70–99)
Potassium: 3.8 mmol/L (ref 3.5–5.1)
Sodium: 133 mmol/L — ABNORMAL LOW (ref 135–145)
Total Bilirubin: 0.8 mg/dL (ref 0.3–1.2)
Total Protein: 6.9 g/dL (ref 6.5–8.1)

## 2023-01-02 LAB — CBC WITH DIFFERENTIAL/PLATELET
Abs Immature Granulocytes: 0.08 10*3/uL — ABNORMAL HIGH (ref 0.00–0.07)
Basophils Absolute: 0 10*3/uL (ref 0.0–0.1)
Basophils Relative: 0 %
Eosinophils Absolute: 0.1 10*3/uL (ref 0.0–0.5)
Eosinophils Relative: 1 %
HCT: 34.9 % — ABNORMAL LOW (ref 36.0–46.0)
Hemoglobin: 11.2 g/dL — ABNORMAL LOW (ref 12.0–15.0)
Immature Granulocytes: 1 %
Lymphocytes Relative: 11 %
Lymphs Abs: 1.4 10*3/uL (ref 0.7–4.0)
MCH: 30 pg (ref 26.0–34.0)
MCHC: 32.1 g/dL (ref 30.0–36.0)
MCV: 93.6 fL (ref 80.0–100.0)
Monocytes Absolute: 0.9 10*3/uL (ref 0.1–1.0)
Monocytes Relative: 7 %
Neutro Abs: 10.6 10*3/uL — ABNORMAL HIGH (ref 1.7–7.7)
Neutrophils Relative %: 80 %
Platelets: 229 10*3/uL (ref 150–400)
RBC: 3.73 MIL/uL — ABNORMAL LOW (ref 3.87–5.11)
RDW: 14.4 % (ref 11.5–15.5)
WBC: 13.1 10*3/uL — ABNORMAL HIGH (ref 4.0–10.5)
nRBC: 0 % (ref 0.0–0.2)

## 2023-01-02 LAB — CBG MONITORING, ED: Glucose-Capillary: 178 mg/dL — ABNORMAL HIGH (ref 70–99)

## 2023-01-02 LAB — I-STAT CG4 LACTIC ACID, ED: Lactic Acid, Venous: 1.6 mmol/L (ref 0.5–1.9)

## 2023-01-02 MED ORDER — CLINDAMYCIN PHOSPHATE 600 MG/50ML IV SOLN
600.0000 mg | Freq: Once | INTRAVENOUS | Status: AC
  Administered 2023-01-02: 600 mg via INTRAVENOUS
  Filled 2023-01-02: qty 50

## 2023-01-02 MED ORDER — IOHEXOL 350 MG/ML SOLN
65.0000 mL | Freq: Once | INTRAVENOUS | Status: AC | PRN
  Administered 2023-01-02: 65 mL via INTRAVENOUS

## 2023-01-02 MED ORDER — CLINDAMYCIN HCL 300 MG PO CAPS: 300.0000 mg | ORAL_CAPSULE | Freq: Three times a day (TID) | ORAL | 0 refills | Status: AC

## 2023-01-02 NOTE — Discharge Instructions (Addendum)
The CT of your face did not show any drainable fluid collection, continue with warm compresses to help with this drainage.  You are placed on a short course of antibiotics to help treat your infection.  Please take 1 tablet 3 times a day for the next 7 days.  You will need to complete the antibiotics completely.

## 2023-01-02 NOTE — ED Notes (Signed)
RN called CT, CT states pt is next

## 2023-01-02 NOTE — Discharge Instructions (Signed)
Please go to the ER immediately as I am very concerned that you have a deep tissue infection to the face.

## 2023-01-02 NOTE — ED Triage Notes (Signed)
Pt c/o right side face abscess for the past 2 weeks getting worse today with swollen and pain.

## 2023-01-02 NOTE — ED Notes (Signed)
Patient is being discharged from the Urgent Care and sent to the Emergency Department via POV . Per [place], [place], patient is in need of higher level of care due to infection deep tissue on her face. Patient is aware and verbalizes understanding of plan of care.  Vitals:   01/02/23 0842  BP: 138/70  Pulse: 72  Resp: 16  Temp: 98.1 F (36.7 C)  SpO2: 96%

## 2023-01-02 NOTE — ED Provider Notes (Signed)
[facility]    CSN: [id number] Arrival date & time: 01/02/23  1324      History   Chief Complaint Chief Complaint  Patient presents with   Abscess    HPI Mallory Potts is a 57 y.o. female.   Patient presents to urgent care for evaluation of abscess to the right side of the face that she first noticed approximately 1 week ago.  Abscess has grown significantly in size and tenderness.  Swelling started initially to the right cheekbone but has now spread towards the right eye.  She noticed her voice started to change a couple of days ago but denies sore throat and difficulty maintaining secretions.  No nausea, vomiting, fever/chills, headache, ear pain, vision changes, eye drainage, or recent trauma/injuries to the right face/recent dental work.  She reports that abscess has started to drain slightly but remains very swollen and firm.  Reports recent MRSA infection to the right side of the face 1 month ago that was treated with antibiotics.  History of type 2 diabetes, taking medications as prescribed.      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   OCD (obsessive compulsive disorder)    Peripheral edema    Seizures (HCC)    Thyroid disease    Tobacco use disorder 05/09/2022    Patient Active Problem List   Diagnosis Date Noted   Cannabis use disorder 11/30/2022   Chronic obstructive lung disease (HCC) 10/18/2022   Type 2 diabetes mellitus (HCC) 10/18/2022   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   MDD 06/14/2022   Right lower lobe pneumonia 05/09/2022   Normocytic anemia 05/09/2022   Class II obesity  05/09/2022   Aortic atherosclerosis (HCC) 05/09/2022   Tobacco use disorder 05/09/2022   Generalized anxiety disorder 03/13/2022   PTSD (post-traumatic stress disorder) 03/13/2022   Long term (current) use of non-steroidal anti-inflammatories (nsaid) 08/08/2021   Proteinuria 08/08/2021   Microalbuminuria 06/29/2021   Stage 3b chronic kidney disease (HCC) 06/29/2021   Chronic kidney disease 08/20/2020   Hypothyroidism 08/20/2020   Insomnia 08/20/2020   Morbid obesity (HCC) 08/20/2020   Osteoarthritis 08/20/2020   Peripheral neuropathic pain 08/20/2020   Rheumatoid arthritis (HCC) 08/20/2020   Seizure (HCC) 11/13/2014   Acute renal failure (HCC) 10/29/2014   Cellulitis of foot 10/24/2014   Hyponatremia 10/24/2014   Hypokalemia 10/24/2014   DM (diabetes mellitus), type 2, uncontrolled    Epilepsy (HCC)    COPD (chronic obstructive pulmonary disease) (HCC)    Borderline personality disorder (HCC)    Hypertension    Essential hypertension    Other epilepsy without status epilepticus, not intractable (HCC)     Past Surgical History:  Procedure Laterality Date   AMPUTATION Right 10/24/2014   Procedure: AMPUTATION RAY RIGHT SECOND TOE;  Surgeon: Beverely Low, MD;  Location: [facility];  Service: Orthopedics;  Laterality: Right;   AMPUTATION Right 10/27/2014   Procedure: RIGHT BELOW KNEE AMPUTATION;  Surgeon: Toni Arthurs, MD;  Location: [facility];  Service: Orthopedics;  Laterality: Right;   KNEE ARTHROSCOPY     TONSILLECTOMY  OB History   No obstetric history on file.      Home Medications    Prior to Admission medications   Medication Sig Start Date End Date Taking? Authorizing Provider  ACCU-CHEK GUIDE test strip UP TO 4 TIMES A DAY 05/13/22   [provider]  Accu-Chek Softclix Lancets lancets USE AS DIRECTED 4 TIMES A DAY 05/13/22   [provider]  acetaminophen (TYLENOL) 500 MG tablet Take 500 mg by mouth daily as needed.    [provider]   acyclovir (ZOVIRAX) 400 MG tablet Take 2 tablets (800 mg total) by mouth 5 (five) times daily. 10/28/22   Prosperi, Christian H, PA-C  albuterol (PROVENTIL HFA;VENTOLIN HFA) 108 (90 BASE) MCG/ACT inhaler Inhale 2 puffs into the lungs every 6 (six) hours as needed for wheezing or shortness of breath. 11/08/11   Domenick Gong, MD  atorvastatin (LIPITOR) 40 MG tablet Take 40 mg by mouth every evening.    [provider]  blood glucose meter kit and supplies KIT Dispense based on patient and insurance preference. Use up to four times daily as directed. 05/13/22   Albertine Grates, MD  Capsaicin-Menthol-Methyl Sal (CAPSAICIN-METHYL SAL-MENTHOL) 0.025-1-12 % CREA Apply 1 Dose topically in the morning, at noon, in the evening, and at bedtime. 10/28/22   Prosperi, Christian H, PA-C  chlorhexidine (PERIDEX) 0.12 % solution Use as directed 15 mLs in the mouth or throat 2 (two) times daily. 11/15/22   Gailen Shelter, PA  Cholecalciferol (VITAMIN D3) 50 MCG (2000 UT) capsule Take 2,000 Units by mouth daily.    [provider]  empagliflozin (JARDIANCE) 25 MG TABS tablet Take 25 mg by mouth daily. 06/12/22   [provider]  famotidine (PEPCID) 20 MG tablet Take 20 mg by mouth 2 (two) times daily.    [provider]  fluticasone (FLONASE) 50 MCG/ACT nasal spray Place 1-2 sprays into both nostrils daily as needed (for seasonal allergies).    [provider]  fluticasone-salmeterol (ADVAIR) 250-50 MCG/ACT AEPB Inhale 1 puff into the lungs in the morning and at bedtime.    [provider]  gabapentin (NEURONTIN) 100 MG capsule Take 100-200 mg by mouth at bedtime. 09/26/22   [provider]  glipiZIDE (GLUCOTROL) 10 MG tablet Take 10 mg by mouth in the morning and at bedtime.    [provider]  hydrochlorothiazide (HYDRODIURIL) 12.5 MG tablet Take 1 tablet (12.5 mg total) by mouth daily. 05/16/22   Albertine Grates, MD  insulin aspart (NOVOLOG) 100 UNIT/ML FlexPen  Insulin sliding scale: Blood sugar  120-150   3units                       151-200   4units                       201-250   7units                       251- 300  11units                       301-350   15uints                       351-400   20units                       >400  call MD immediately 05/13/22   Albertine Grates, MD  insulin detemir (LEVEMIR FLEXTOUCH) 100 UNIT/ML FlexPen Inject 10 Units into the skin daily. 05/13/22   Albertine Grates, MD  Insulin Pen Needle (PEN NEEDLES 3/16") 31G X 5 MM MISC For insulin dependent dm2 05/13/22   Albertine Grates, MD  levothyroxine (SYNTHROID, LEVOTHROID) 50 MCG tablet Take 50 mcg by mouth daily before breakfast.    [provider]  lidocaine (XYLOCAINE) 2 % solution Use as directed 15 mLs in the mouth or throat as needed for mouth pain. You can swish and spit, but safe to swallow as well 10/28/22   Prosperi, Christian H, PA-C  lisinopril (ZESTRIL) 2.5 MG tablet Take 2.5 mg by mouth daily.    [provider]  MUCINEX 600 MG 12 hr tablet Take 600 mg by mouth 2 (two) times daily as needed for cough or to loosen phlegm.    [provider]  Multiple Vitamins-Minerals (SENTRY ADULT PO) Sentry    [provider]  oxyCODONE-acetaminophen (PERCOCET/ROXICET) 5-325 MG tablet Take 1 tablet by mouth every 6 (six) hours as needed for severe pain. 10/28/22   Prosperi, Christian H, PA-C  potassium chloride (MICRO-K) 10 MEQ CR capsule Take 20 mEq by mouth daily. 10/05/11   [provider]  predniSONE (DELTASONE) 20 MG tablet Take 3 tablets (60 mg total) by mouth daily. 10/28/22   Prosperi, Christian H, PA-C  sertraline (ZOLOFT) 100 MG tablet Take 1 tablet (100 mg total) by mouth in the morning. 12/29/22 02/27/23  [name], DO  traZODone (DESYREL) 50 MG tablet Take 0.5-1 tablets (25-50 mg total) by mouth at bedtime as needed for sleep. 12/29/22 02/27/23  [name], DO  spironolactone (ALDACTONE) 25 MG tablet Take 1 tablet (25 mg total) by  mouth daily. 06/19/11 10/26/11  Devoria Albe, MD    Family History Family History  Problem Relation Age of Onset   Asthma Other    Diabetes Other    Hypertension Other     Social History Social History   Tobacco Use   Smoking status: Every Day    Current packs/day: 0.50    Average packs/day: 0.5 packs/day for 5.0 years (2.5 ttl pk-yrs)    Types: Cigarettes  Substance Use Topics   Alcohol use: No    Comment: OCASSIONALLY   Drug use: No     Allergies   Lithium, Chantix [varenicline], Milk-related compounds, Other, Wellbutrin [bupropion], Abilify [aripiprazole], and Hydrocortisone   Review of Systems Review of Systems Per HPI  Physical Exam Triage Vital Signs ED Triage Vitals  Encounter Vitals Group     BP 01/02/23 0842 138/70     Systolic BP Percentile --      Diastolic BP Percentile --      Pulse Rate 01/02/23 0842 72     Resp 01/02/23 0842 16     Temp 01/02/23 0842 98.1 F (36.7 C)     Temp Source 01/02/23 0842 Oral     SpO2 01/02/23 0842 96 %     Weight 01/02/23 0843 207 lb 3.7 oz (94 kg)     Height 01/02/23 0843 5\' 3"  (1.6 m)     Head Circumference --      Peak Flow --      Pain Score 01/02/23 0843 5     Pain Loc --      Pain Education --      Exclude from Growth Chart --    No data found.  Updated Vital Signs BP  138/70 (BP Location: Right Arm)   Pulse 72   Temp 98.1 F (36.7 C) (Oral)   Resp 16   Ht 5\' 3"  (1.6 m)   Wt 207 lb 3.7 oz (94 kg)   LMP 10/09/2014 (Exact Date)   SpO2 96%   BMI 36.71 kg/m   Visual Acuity Right Eye Distance:   Left Eye Distance:   Bilateral Distance:    Right Eye Near:   Left Eye Near:    Bilateral Near:     Physical Exam Vitals and nursing note reviewed.  Constitutional:      Appearance: She is not ill-appearing or toxic-appearing.  HENT:     Head: Normocephalic and atraumatic.     Jaw: There is normal jaw occlusion. Swelling present. No trismus.      Right Ear: Hearing and external ear normal.     Left  Ear: Hearing and external ear normal.     Nose: Nose normal.     Mouth/Throat:     Lips: Pink.     Mouth: Mucous membranes are moist. No injury.     Tongue: No lesions. Tongue does not deviate from midline.     Palate: No mass and lesions.     Pharynx: Uvula midline. Pharyngeal swelling, posterior oropharyngeal erythema and uvula swelling present. No oropharyngeal exudate.     Tonsils: No tonsillar exudate or tonsillar abscesses.     Comments: Muffled voice sounds to exam, maintaining secretions without difficulty/drooling. Eyes:     General: Lids are normal. Vision grossly intact. Gaze aligned appropriately.     Extraocular Movements: Extraocular movements intact.     Conjunctiva/sclera: Conjunctivae normal.  Neck:     Trachea: Trachea normal. No abnormal tracheal secretions.  Cardiovascular:     Rate and Rhythm: Normal rate and regular rhythm.     Heart sounds: Normal heart sounds, S1 normal and S2 normal.  Pulmonary:     Effort: Pulmonary effort is normal. No respiratory distress.     Breath sounds: Normal breath sounds and air entry.  Musculoskeletal:     Cervical back: Neck supple.  Lymphadenopathy:     Cervical: Cervical adenopathy present.     Right cervical: Superficial cervical adenopathy and deep cervical adenopathy present.  Skin:    General: Skin is warm and dry.     Capillary Refill: Capillary refill takes less than 2 seconds.     Findings: No rash.  Neurological:     General: No focal deficit present.     Mental Status: She is alert and oriented to person, place, and time. Mental status is at baseline.     Cranial Nerves: No dysarthria or facial asymmetry.  Psychiatric:        Mood and Affect: Mood normal.        Speech: Speech normal.        Behavior: Behavior normal.        Thought Content: Thought content normal.        Judgment: Judgment normal.         UC Treatments / Results  Labs (all labs ordered are listed, but only abnormal results are  displayed) Labs Reviewed - No data to display  EKG   Radiology No results found.  Procedures Procedures (including critical care time)  Medications Ordered in UC Medications - No data to display  Initial Impression / Assessment and Plan / UC Course  I have reviewed the triage vital signs and the nursing notes.  Pertinent labs & imaging  results that were available during my care of the patient were reviewed by me and considered in my medical decision making (see chart for details).   1.  Facial abscess, facial swelling, abnormal voice Vocal changes, uvula swelling, and facial abscess concerning for deep soft tissue infection of the face.  Patient would benefit from further workup that we are unable to provide here in the urgent care setting including likely advanced imaging of the maxillofacial region and urgent blood work.   Airway currently intact, maintaining secretions without difficulty/drooling despite muffled voice changes.  Discussed recommendations with patient who expresses understanding and agreement with plan.  Discussed risks of deferring ED visit to which she further expresses understanding and agreement with plan.  Discharged to the nearest emergency department in stable condition.     Final Clinical Impressions(s) / UC Diagnoses   Final diagnoses:  Facial swelling  Abnormal voice  Facial abscess     Discharge Instructions      Please go to the ER immediately as I am very concerned that you have a deep tissue infection to the face.     ED Prescriptions   None    PDMP not reviewed this encounter.   Carlisle Beers, [place] 01/02/23 [phone]

## 2023-01-02 NOTE — ED Triage Notes (Signed)
Pt came in via POV d/t an abscess developing on her Rt cheek a few weeks ago & it burst 2 days ago & has been intermittently draining. Her Rt side of her face does have swelling & redness around her face & up der her Rt eye. A/Ox4, rates her pain 7/10.

## 2023-01-02 NOTE — ED Provider Notes (Signed)
EMERGENCY DEPARTMENT AT [facility] Provider Note   CSN: [id number] Arrival date & time: 01/02/23  4098     History Diabetes, CKD3b Chief Complaint  Patient presents with   Facial Abcess    Mallory Potts is a 57 y.o. female.  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  Patient was evaluated urgent care today, sent to the ED for further evaluation.  She reports she came in today as she felt like she could not see, as the abscess is not extending and tracking into the lower eyelid.  The abscess began draining 2 days ago, has drained overnight, however continues to have some induration noted to it.  She has been taking Tylenol for pain without much improvement in symptoms, she does have a subjective fever but did record a Tmax of 100 at home.  Reports having pain with swallowing to the right side only.  No shortness of breath, no vision loss, no trismus.   The history is provided by the patient.       Home Medications Prior to Admission medications   Medication Sig Start Date End Date Taking? Authorizing Provider  clindamycin (CLEOCIN) 300 MG capsule Take 1 capsule (300 mg total) by mouth 3 (three) times daily for 7 days. 01/02/23 01/09/23 Yes , Leonie Douglas, PA-C  ACCU-CHEK GUIDE test strip UP TO 4 TIMES A DAY 05/13/22   [provider]  Accu-Chek Softclix Lancets lancets USE AS DIRECTED 4 TIMES A DAY 05/13/22   [provider]  acetaminophen (TYLENOL) 500 MG tablet Take 500 mg by mouth daily as needed.    [provider]  acyclovir (ZOVIRAX) 400 MG tablet Take 2 tablets (800 mg total) by mouth 5 (five) times daily. 10/28/22   Prosperi, Christian H, PA-C  albuterol (PROVENTIL HFA;VENTOLIN HFA) 108 (90 BASE) MCG/ACT inhaler Inhale 2 puffs into the lungs every 6 (six) hours as needed for wheezing or  shortness of breath. 11/08/11   Domenick Gong, MD  atorvastatin (LIPITOR) 40 MG tablet Take 40 mg by mouth every evening.    [provider]  blood glucose meter kit and supplies KIT Dispense based on patient and insurance preference. Use up to four times daily as directed. 05/13/22   Albertine Grates, MD  Capsaicin-Menthol-Methyl Sal (CAPSAICIN-METHYL SAL-MENTHOL) 0.025-1-12 % CREA Apply 1 Dose topically in the morning, at noon, in the evening, and at bedtime. 10/28/22   Prosperi, Christian H, PA-C  chlorhexidine (PERIDEX) 0.12 % solution Use as directed 15 mLs in the mouth or throat 2 (two) times daily. 11/15/22   Gailen Shelter, PA  Cholecalciferol (VITAMIN D3) 50 MCG (2000 UT) capsule Take 2,000 Units by mouth daily.    [provider]  empagliflozin (JARDIANCE) 25 MG TABS tablet Take 25 mg by mouth daily. 06/12/22   [provider]  famotidine (PEPCID) 20 MG tablet Take 20 mg by mouth 2 (two) times daily.    [provider]  fluticasone (FLONASE) 50 MCG/ACT nasal spray Place 1-2 sprays into both nostrils daily as needed (for seasonal allergies).    [provider]  fluticasone-salmeterol (ADVAIR) 250-50 MCG/ACT AEPB Inhale 1 puff into the lungs in the morning and at bedtime.    [provider]  gabapentin (NEURONTIN) 100 MG capsule Take 100-200 mg by mouth at bedtime. 09/26/22  [provider]  glipiZIDE (GLUCOTROL) 10 MG tablet Take 10 mg by mouth in the morning and at bedtime.    [provider]  hydrochlorothiazide (HYDRODIURIL) 12.5 MG tablet Take 1 tablet (12.5 mg total) by mouth daily. 05/16/22   Albertine Grates, MD  insulin aspart (NOVOLOG) 100 UNIT/ML FlexPen Insulin sliding scale: Blood sugar  120-150   3units                       151-200   4units                       201-250   7units                       251- 300  11units                       301-350   15uints                       351-400   20units                        >400         call MD immediately 05/13/22   Albertine Grates, MD  insulin detemir (LEVEMIR FLEXTOUCH) 100 UNIT/ML FlexPen Inject 10 Units into the skin daily. 05/13/22   Albertine Grates, MD  Insulin Pen Needle (PEN NEEDLES 3/16") 31G X 5 MM MISC For insulin dependent dm2 05/13/22   Albertine Grates, MD  levothyroxine (SYNTHROID, LEVOTHROID) 50 MCG tablet Take 50 mcg by mouth daily before breakfast.    [provider]  lidocaine (XYLOCAINE) 2 % solution Use as directed 15 mLs in the mouth or throat as needed for mouth pain. You can swish and spit, but safe to swallow as well 10/28/22   Prosperi, Christian H, PA-C  lisinopril (ZESTRIL) 2.5 MG tablet Take 2.5 mg by mouth daily.    [provider]  MUCINEX 600 MG 12 hr tablet Take 600 mg by mouth 2 (two) times daily as needed for cough or to loosen phlegm.    [provider]  Multiple Vitamins-Minerals (SENTRY ADULT PO) Sentry    [provider]  oxyCODONE-acetaminophen (PERCOCET/ROXICET) 5-325 MG tablet Take 1 tablet by mouth every 6 (six) hours as needed for severe pain. 10/28/22   Prosperi, Christian H, PA-C  potassium chloride (MICRO-K) 10 MEQ CR capsule Take 20 mEq by mouth daily. 10/05/11   [provider]  predniSONE (DELTASONE) 20 MG tablet Take 3 tablets (60 mg total) by mouth daily. 10/28/22   Prosperi, Christian H, PA-C  sertraline (ZOLOFT) 100 MG tablet Take 1 tablet (100 mg total) by mouth in the morning. 12/29/22 02/27/23  [name], DO  traZODone (DESYREL) 50 MG tablet Take 0.5-1 tablets (25-50 mg total) by mouth at bedtime as needed for sleep. 12/29/22 02/27/23  [name], DO  spironolactone (ALDACTONE) 25 MG tablet Take 1 tablet (25 mg total) by mouth daily. 06/19/11 10/26/11  Devoria Albe, MD      Allergies    Lithium, Chantix [varenicline], Milk-related compounds, Other, Wellbutrin [bupropion], Abilify [aripiprazole], and Hydrocortisone    Review of Systems   Review of Systems  Constitutional:  Positive for chills and  fever.  Respiratory:  Negative for shortness of breath.   Cardiovascular:  Negative for chest pain.  Gastrointestinal:  Negative for  abdominal pain.  Genitourinary:  Negative for flank pain.  Neurological:  Negative for light-headedness.  All other systems reviewed and are negative.   Physical Exam Updated Vital Signs BP 129/66 (BP Location: Left Arm)   Pulse 72   Temp 97.7 F (36.5 C) (Oral)   Resp 18   LMP 10/09/2014 (Exact Date)   SpO2 98%  Physical Exam Vitals and nursing note reviewed.  Constitutional:      Appearance: Normal appearance.  HENT:     Head: Normocephalic.     Comments: Please see photos attached.     Mouth/Throat:     Mouth: Mucous membranes are dry.     Dentition: Dental caries and dental abscesses present.     Tongue: Tongue does not deviate from midline.     Pharynx: Posterior oropharyngeal erythema present. No oropharyngeal exudate.     Tonsils: No tonsillar exudate or tonsillar abscesses.     Comments: Unable to visualize oropharynx, but uvula is midline and no obvious PTA noted.  Eyes:     Pupils: Pupils are equal, round, and reactive to light.  Cardiovascular:     Rate and Rhythm: Normal rate.  Pulmonary:     Effort: Pulmonary effort is normal.     Breath sounds: No wheezing or rales.  Abdominal:     General: Abdomen is flat.     Palpations: Abdomen is soft.  Musculoskeletal:     Cervical back: Normal range of motion.  Skin:    General: Skin is warm and dry.  Neurological:     Mental Status: She is alert and oriented to person, place, and time.     ED Results / Procedures / Treatments   Labs (all labs ordered are listed, but only abnormal results are displayed) Labs Reviewed  CBC WITH DIFFERENTIAL/PLATELET - Abnormal; Notable for the following components:      Result Value   WBC 13.1 (*)    RBC 3.73 (*)    Hemoglobin 11.2 (*)    HCT 34.9 (*)    Neutro Abs 10.6 (*)    Abs Immature Granulocytes 0.08 (*)    All other components  within normal limits  COMPREHENSIVE METABOLIC PANEL - Abnormal; Notable for the following components:   Sodium 133 (*)    CO2 20 (*)    Glucose, Bld 205 (*)    BUN 34 (*)    Creatinine, Ser 1.89 (*)    Calcium 8.8 (*)    Albumin 3.1 (*)    GFR, Estimated 31 (*)    All other components within normal limits  CBG MONITORING, ED - Abnormal; Notable for the following components:   Glucose-Capillary 178 (*)    All other components within normal limits  I-STAT CG4 LACTIC ACID, ED    EKG None  Radiology CT Maxillofacial W Contrast  Result Date: 01/02/2023 CLINICAL DATA:  Nasal abscess facial abscess EXAM: CT MAXILLOFACIAL WITH CONTRAST TECHNIQUE: Multidetector CT imaging of the maxillofacial structures was performed with intravenous contrast. Multiplanar CT image reconstructions were also generated. RADIATION DOSE REDUCTION: This exam was performed according to the departmental dose-optimization program which includes automated exposure control, adjustment of the mA and/or kV according to patient size and/or use of iterative reconstruction technique. CONTRAST:  65mL OMNIPAQUE IOHEXOL 350 MG/ML SOLN COMPARISON:  None Available. FINDINGS: Osseous: No fracture or mandibular dislocation. No destructive process. Orbits: Negative. No traumatic or inflammatory finding. Sinuses: No middle ear or mastoid effusion. Pansinus mucosal thickening. Orbits are unremarkable. Soft tissues: Asymmetric soft  tissue stranding in the right facial soft tissues. No drainable fluid collection. Limited intracranial: No significant or unexpected finding. IMPRESSION: Asymmetric soft tissue stranding in the right facial soft tissues, compatible with cellulitis. No drainable fluid collection. Electronically Signed   By: Lorenza Cambridge M.D.   On: 01/02/2023 14:15    Procedures Procedures    Medications Ordered in ED Medications  clindamycin (CLEOCIN) IVPB 600 mg (0 mg Intravenous Stopped 01/02/23 1259)  iohexol (OMNIPAQUE)  350 MG/ML injection 65 mL (65 mLs Intravenous Contrast Given 01/02/23 1312)    ED Course/ Medical Decision Making/ A&P                                 Medical Decision Making Amount and/or Complexity of Data Reviewed Labs: ordered. Radiology: ordered.  Risk Prescription drug management.    This patient presents to the ED for concern of facial abscess, this involves a number of treatment options, and is a complaint that carries with it a high risk of complications and morbidity.  The differential diagnosis includes facial cellulitis, deep space infection versus shingles.   Co morbidities: Discussed in HPI   Brief History:  See HPI  EMR reviewed including pt PMHx, past surgical history and past visits to ER.   See HPI for more details   Lab Tests:  I ordered and independently interpreted labs.  The pertinent results include:    CBC with a slight elevation in her leukocytosis of 13.1, hemoglobin slightly decreased but stable.  CMP with some stable creatinine, no electrolyte derangement.  Lactic acid is negative.   Imaging Studies:  CT Maxillofacial: Asymmetric soft tissue stranding in the right facial soft tissues,  compatible with cellulitis. No drainable fluid collection.   Medicines ordered:  I ordered medication including clindamycin for facial cellulitis Reevaluation of the patient after these medicines showed that the patient stayed the same I have reviewed the patients home medicines and have made adjustments as needed  Reevaluation:  After the interventions noted above I re-evaluated patient and found that they have :stayed the same  Social Determinants of Health:  The patient's social determinants of health were a factor in the care of this patient   Problem List / ED Course:  Patient here with a week and a half of facial cellulitis to the right, evaluated urgent care this morning and sent to the ED for further evaluation.  Now having pain along the  right side of the face and difficulty seen as wound and now has caused some swelling to her lower eyelid.  No vision loss, no vision changes.  No fever however has been taking Tylenol for pain control.  Brought to the ED hemodynamically stable.  EOMs are intact.  White blood cell count slightly elevated leukocytosis, the rest of her electrolytes are within normal limits.  She received IV clindamycin while in the emergency department to help treat for facial cellulitis, wound was actively draining yesterday however it has stopped induration throughout with some swelling tracking into the lower eyelid.  CT maxillofacial showed no drainable fluid collection, we discussed warm compresses, will need to go home on antibiotic therapy.  She is agreeable to plan and treatment at this time, return precautions discussed at length.  Patient is hemodynamically stable for discharge.  Dispostion:  After consideration of the diagnostic results and the patients response to treatment, I feel that the patent would benefit from abx therapy.  Portions of this note were generated with Scientist, clinical (histocompatibility and immunogenetics). Dictation errors may occur despite best attempts at proofreading.   Final Clinical Impression(s) / ED Diagnoses Final diagnoses:  Facial abscess    Rx / DC Orders ED Discharge Orders          Ordered    clindamycin (CLEOCIN) 300 MG capsule  3 times daily        01/02/23 1359              Claude Manges, PA-C 01/02/23 1426    Gerhard Munch, MD 01/02/23 1454

## 2023-01-03 DIAGNOSIS — L0201 Cutaneous abscess of face: Secondary | ICD-10-CM | POA: Diagnosis not present

## 2023-01-07 DIAGNOSIS — Z8631 Personal history of diabetic foot ulcer: Secondary | ICD-10-CM | POA: Diagnosis not present

## 2023-01-07 DIAGNOSIS — E1040 Type 1 diabetes mellitus with diabetic neuropathy, unspecified: Secondary | ICD-10-CM | POA: Diagnosis not present

## 2023-01-16 ENCOUNTER — Other Ambulatory Visit (HOSPITAL_COMMUNITY): Payer: Self-pay | Admitting: Student

## 2023-01-16 DIAGNOSIS — F331 Major depressive disorder, recurrent, moderate: Secondary | ICD-10-CM

## 2023-01-16 DIAGNOSIS — F605 Obsessive-compulsive personality disorder: Secondary | ICD-10-CM

## 2023-01-16 DIAGNOSIS — F411 Generalized anxiety disorder: Secondary | ICD-10-CM

## 2023-01-16 DIAGNOSIS — F4310 Post-traumatic stress disorder, unspecified: Secondary | ICD-10-CM

## 2023-01-16 DIAGNOSIS — F603 Borderline personality disorder: Secondary | ICD-10-CM

## 2023-02-01 DIAGNOSIS — Z0000 Encounter for general adult medical examination without abnormal findings: Secondary | ICD-10-CM | POA: Diagnosis not present

## 2023-02-01 DIAGNOSIS — Z794 Long term (current) use of insulin: Secondary | ICD-10-CM | POA: Diagnosis not present

## 2023-02-01 DIAGNOSIS — J449 Chronic obstructive pulmonary disease, unspecified: Secondary | ICD-10-CM | POA: Diagnosis not present

## 2023-02-01 DIAGNOSIS — E1165 Type 2 diabetes mellitus with hyperglycemia: Secondary | ICD-10-CM | POA: Diagnosis not present

## 2023-02-01 DIAGNOSIS — Z1211 Encounter for screening for malignant neoplasm of colon: Secondary | ICD-10-CM | POA: Diagnosis not present

## 2023-02-01 DIAGNOSIS — R809 Proteinuria, unspecified: Secondary | ICD-10-CM | POA: Diagnosis not present

## 2023-02-01 DIAGNOSIS — Z89511 Acquired absence of right leg below knee: Secondary | ICD-10-CM | POA: Diagnosis not present

## 2023-02-01 DIAGNOSIS — R6889 Other general symptoms and signs: Secondary | ICD-10-CM | POA: Diagnosis not present

## 2023-02-01 DIAGNOSIS — Z1239 Encounter for other screening for malignant neoplasm of breast: Secondary | ICD-10-CM | POA: Diagnosis not present

## 2023-02-05 ENCOUNTER — Ambulatory Visit (INDEPENDENT_AMBULATORY_CARE_PROVIDER_SITE_OTHER): Payer: Medicare HMO | Admitting: Mental Health

## 2023-02-05 DIAGNOSIS — F603 Borderline personality disorder: Secondary | ICD-10-CM | POA: Diagnosis not present

## 2023-02-05 DIAGNOSIS — F605 Obsessive-compulsive personality disorder: Secondary | ICD-10-CM

## 2023-02-05 DIAGNOSIS — F411 Generalized anxiety disorder: Secondary | ICD-10-CM

## 2023-02-05 DIAGNOSIS — F331 Major depressive disorder, recurrent, moderate: Secondary | ICD-10-CM | POA: Diagnosis not present

## 2023-02-05 NOTE — Progress Notes (Unsigned)
THERAPIST PROGRESS NOTE Virtual Visit via Video Note  I connected with Mallory Potts on 02/05/23 at  1:00 PM EDT by a video enabled telemedicine application and verified that I am speaking with the correct person using two identifiers.  Location: Patient: sister house Provider: home office   I discussed the limitations of evaluation and management by telemedicine and the availability of in person appointments. The patient expressed understanding and agreed to proceed.  I discussed the assessment and treatment plan with the patient. The patient was provided an opportunity to ask questions and all were answered. The patient agreed with the plan and demonstrated an understanding of the instructions.   The patient was advised to call back or seek an in-person evaluation if the symptoms worsen or if the condition fails to improve as anticipated.  I provided 44 minutes of non-face-to-face time during this encounter.   Mallory Potts, [facility]   Session Time: 1:03 pm ( 44 minutes)  Participation Level: Active  Behavioral Response: CasualAlertDysphoric  Type of Therapy: Individual Therapy  Treatment Goals addressed: STG: "More stable. Even keeled" Annet will increase mental stability AEB development of effective coping skills for moods with engagement in daily healthy habits within the next 90 days    STG: "My own place." Lennette will increase independence AEB development of decision making skills and independent living skills within the next 90 days.    ProgressTowards Goals: Progressing  Interventions: CBT and Supportive  Summary:  Mallory Potts is a 57 y.o. female who presents with dx hx of MDD moderate, recurrent, GAD and OCD personality disorder. Melizza presents for session alert and oriented; mood and affect adequate,stable. Speech clear and coherent at normal rate and tone. Engaged and receptive. Shares some feelings of stress and reports to currently be residing with her sister in  [place]. Shares for rental to have secured to have fell through and notes to have been living outside of a car when sister retried her to reside with her. Shares feelings of distress related to not able to have her dog with her and to currently be being boarded. Shares working to secure place of her own with possible roommate; notes support from diabetic case Financial controller. Shares concern with moving and ability to have pet dog with her and to have previously had ESA letter. Shares for pet dog to support with coping with emotions and feelings of depression and to have had pet for the past x 10 years. Notes some improvements with living with sister as she has been more mobile and eating better. Shares with currently being on second floor at sister house has started to count the steps which has translated to increase counting OCD behaviors. Shares to be getting along with sister well and managing moods. Denies irritable outburst and engaging in daily healthy habits. Shares for spirits to be up and hopeful of finding housing. Denies SI/HI. Progress with goals.   Suicidal/Homicidal: Nowithout intent/plan  Therapist Response: Therapist engaged [place] in therapy session. Completed check in and assessed for current level of functioning, sxs management and level of stressors. Provided supportive feedback; validated feelings. Engaged Rondell in exploring ability to manage stressors related to currently living with sister. Provided safe space to share concerns and provided supportive feedback. Explored options for housing and use of cognitive coping with balanced healthy thoughts. Supported in processing feelings and stress and engaging in relaxation behaviors and ability to rationalize though increase in OCD behaviors. Encouraged following up with case manager with support for  housing as well as exploring independently. Agreed to provide ESA letter. No safety concerns reported.   Plan: Return again in  x 6  weeks.  Diagnosis: Moderate episode of recurrent major depressive disorder (HCC)  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HCC)  GAD (generalized anxiety disorder)  Borderline personality disorder (HCC)  Collaboration of Care: Other None  Patient/Guardian was advised Release of Information must be obtained prior to any record release in order to collaborate their care with an outside provider. Patient/Guardian was advised if they have not already done so to contact the registration department to sign all necessary forms in order for [place] to release information regarding their care.   Consent: Patient/Guardian gives verbal consent for treatment and assignment of benefits for services provided during this visit. Patient/Guardian expressed understanding and agreed to proceed.   Stephan Minister [place], [facility] 02/05/2023

## 2023-02-08 ENCOUNTER — Encounter (HOSPITAL_COMMUNITY): Payer: Self-pay | Admitting: Student

## 2023-02-08 ENCOUNTER — Ambulatory Visit (INDEPENDENT_AMBULATORY_CARE_PROVIDER_SITE_OTHER): Payer: Medicare HMO | Admitting: Student

## 2023-02-08 DIAGNOSIS — F4310 Post-traumatic stress disorder, unspecified: Secondary | ICD-10-CM | POA: Diagnosis not present

## 2023-02-08 DIAGNOSIS — F603 Borderline personality disorder: Secondary | ICD-10-CM | POA: Diagnosis not present

## 2023-02-08 DIAGNOSIS — F411 Generalized anxiety disorder: Secondary | ICD-10-CM

## 2023-02-08 DIAGNOSIS — F605 Obsessive-compulsive personality disorder: Secondary | ICD-10-CM

## 2023-02-08 DIAGNOSIS — R6889 Other general symptoms and signs: Secondary | ICD-10-CM | POA: Diagnosis not present

## 2023-02-08 DIAGNOSIS — F331 Major depressive disorder, recurrent, moderate: Secondary | ICD-10-CM

## 2023-02-08 MED ORDER — TRAZODONE HCL 50 MG PO TABS
25.0000 mg | ORAL_TABLET | Freq: Every evening | ORAL | 0 refills | Status: DC | PRN
Start: 2023-02-08 — End: 2023-03-15

## 2023-02-08 MED ORDER — SERTRALINE HCL 100 MG PO TABS
100.0000 mg | ORAL_TABLET | Freq: Every morning | ORAL | 0 refills | Status: DC
Start: 2023-02-08 — End: 2023-03-15
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
## 2023-02-21 ENCOUNTER — Encounter (HOSPITAL_COMMUNITY): Payer: Self-pay | Admitting: Student

## 2023-02-21 DIAGNOSIS — F605 Obsessive-compulsive personality disorder: Secondary | ICD-10-CM

## 2023-02-21 DIAGNOSIS — F331 Major depressive disorder, recurrent, moderate: Secondary | ICD-10-CM

## 2023-02-21 DIAGNOSIS — F411 Generalized anxiety disorder: Secondary | ICD-10-CM

## 2023-02-21 DIAGNOSIS — F4310 Post-traumatic stress disorder, unspecified: Secondary | ICD-10-CM

## 2023-02-21 DIAGNOSIS — F603 Borderline personality disorder: Secondary | ICD-10-CM

## 2023-03-01 DIAGNOSIS — R6889 Other general symptoms and signs: Secondary | ICD-10-CM | POA: Diagnosis not present

## 2023-03-01 DIAGNOSIS — R3129 Other microscopic hematuria: Secondary | ICD-10-CM | POA: Diagnosis not present

## 2023-03-01 DIAGNOSIS — R8281 Pyuria: Secondary | ICD-10-CM | POA: Diagnosis not present

## 2023-03-01 DIAGNOSIS — E559 Vitamin D deficiency, unspecified: Secondary | ICD-10-CM | POA: Diagnosis not present

## 2023-03-01 DIAGNOSIS — M898X9 Other specified disorders of bone, unspecified site: Secondary | ICD-10-CM | POA: Diagnosis not present

## 2023-03-01 DIAGNOSIS — I129 Hypertensive chronic kidney disease with stage 1 through stage 4 chronic kidney disease, or unspecified chronic kidney disease: Secondary | ICD-10-CM | POA: Diagnosis not present

## 2023-03-01 DIAGNOSIS — E1122 Type 2 diabetes mellitus with diabetic chronic kidney disease: Secondary | ICD-10-CM | POA: Diagnosis not present

## 2023-03-01 DIAGNOSIS — R801 Persistent proteinuria, unspecified: Secondary | ICD-10-CM | POA: Diagnosis not present

## 2023-03-01 DIAGNOSIS — Z794 Long term (current) use of insulin: Secondary | ICD-10-CM | POA: Diagnosis not present

## 2023-03-01 DIAGNOSIS — N1832 Chronic kidney disease, stage 3b: Secondary | ICD-10-CM | POA: Diagnosis not present

## 2023-03-07 DIAGNOSIS — H43813 Vitreous degeneration, bilateral: Secondary | ICD-10-CM | POA: Diagnosis not present

## 2023-03-07 DIAGNOSIS — H2513 Age-related nuclear cataract, bilateral: Secondary | ICD-10-CM | POA: Diagnosis not present

## 2023-03-07 DIAGNOSIS — R6889 Other general symptoms and signs: Secondary | ICD-10-CM | POA: Diagnosis not present

## 2023-03-07 DIAGNOSIS — E119 Type 2 diabetes mellitus without complications: Secondary | ICD-10-CM | POA: Diagnosis not present

## 2023-03-13 DIAGNOSIS — Z0100 Encounter for examination of eyes and vision without abnormal findings: Secondary | ICD-10-CM | POA: Diagnosis not present

## 2023-03-15 ENCOUNTER — Encounter (HOSPITAL_COMMUNITY): Payer: Self-pay | Admitting: Student

## 2023-03-15 MED ORDER — SERTRALINE HCL 100 MG PO TABS
100.0000 mg | ORAL_TABLET | Freq: Every morning | ORAL | 0 refills | Status: DC
Start: 2023-03-15 — End: 2023-04-12

## 2023-03-15 MED ORDER — TRAZODONE HCL 50 MG PO TABS
25.0000 mg | ORAL_TABLET | Freq: Every evening | ORAL | 0 refills | Status: DC | PRN
Start: 2023-03-15 — End: 2023-04-12

## 2023-03-19 ENCOUNTER — Ambulatory Visit (INDEPENDENT_AMBULATORY_CARE_PROVIDER_SITE_OTHER): Payer: Medicare HMO | Admitting: Mental Health

## 2023-03-19 ENCOUNTER — Encounter (HOSPITAL_COMMUNITY): Payer: Self-pay

## 2023-03-19 DIAGNOSIS — F603 Borderline personality disorder: Secondary | ICD-10-CM

## 2023-03-19 DIAGNOSIS — F321 Major depressive disorder, single episode, moderate: Secondary | ICD-10-CM | POA: Diagnosis not present

## 2023-03-19 DIAGNOSIS — F411 Generalized anxiety disorder: Secondary | ICD-10-CM
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
## 2023-04-05 DIAGNOSIS — R6889 Other general symptoms and signs: Secondary | ICD-10-CM | POA: Diagnosis not present

## 2023-04-06 DIAGNOSIS — R6889 Other general symptoms and signs: Secondary | ICD-10-CM | POA: Diagnosis not present

## 2023-04-12 ENCOUNTER — Telehealth (INDEPENDENT_AMBULATORY_CARE_PROVIDER_SITE_OTHER): Payer: Medicare HMO | Admitting: Student

## 2023-04-12 ENCOUNTER — Encounter (HOSPITAL_COMMUNITY): Payer: Medicare HMO | Admitting: Student

## 2023-04-12 DIAGNOSIS — F331 Major depressive disorder, recurrent, moderate: Secondary | ICD-10-CM | POA: Diagnosis not present

## 2023-04-12 DIAGNOSIS — F603 Borderline personality disorder: Secondary | ICD-10-CM

## 2023-04-12 DIAGNOSIS — F411 Generalized anxiety disorder: Secondary | ICD-10-CM | POA: Diagnosis not present

## 2023-04-12 DIAGNOSIS — F4310 Post-traumatic stress disorder, unspecified: Secondary | ICD-10-CM

## 2023-04-12 DIAGNOSIS — F605 Obsessive-compulsive personality disorder: Secondary | ICD-10-CM

## 2023-04-12 DIAGNOSIS — F17200 Nicotine dependence, unspecified, uncomplicated: Secondary | ICD-10-CM

## 2023-04-12 MED ORDER — TRAZODONE HCL 50 MG PO TABS
25.0000 mg | ORAL_TABLET | Freq: Every evening | ORAL | 0 refills | Status: AC | PRN
Start: 2023-04-12 — End: 2023-07-11

## 2023-04-12 MED ORDER — SERTRALINE HCL 100 MG PO TABS
100.0000 mg | ORAL_TABLET | Freq: Every morning | ORAL | 0 refills | Status: AC
Start: 2023-04-12 — End: 2023-07-11

## 2023-04-12 MED ORDER — NICOTINE POLACRILEX 2 MG MT GUM
2.0000 mg | CHEWING_GUM | OROMUCOSAL | 0 refills | Status: DC | PRN
Start: 2023-04-12 — End: 2023-07-05

## 2023-04-12 MED ORDER — NICOTINE 7 MG/24HR TD PT24
7.0000 mg | MEDICATED_PATCH | Freq: Every day | TRANSDERMAL | 0 refills | Status: AC
Start: 2023-04-12 — End: 2023-05-12
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
## 2023-04-13 ENCOUNTER — Encounter (HOSPITAL_COMMUNITY): Payer: Self-pay | Admitting: Student

## 2023-04-14 NOTE — Addendum Note (Signed)
Addended by: Theodoro Kos A on: 04/14/2023 08:31 AM   Modules accepted: Level of Service

## 2023-04-30 ENCOUNTER — Ambulatory Visit
Admission: EM | Admit: 2023-04-30 | Discharge: 2023-04-30 | Disposition: A | Discharge status: Home / Self Care | Payer: Medicare HMO | Attending: Family Medicine | Admitting: Family Medicine

## 2023-04-30 DIAGNOSIS — L03116 Cellulitis of left lower limb: Secondary | ICD-10-CM

## 2023-04-30 DIAGNOSIS — M069 Rheumatoid arthritis, unspecified: Secondary | ICD-10-CM | POA: Diagnosis not present

## 2023-04-30 MED ORDER — DOXYCYCLINE HYCLATE 100 MG PO CAPS: 100.0000 mg | ORAL_CAPSULE | Freq: Two times a day (BID) | ORAL | 0 refills | Status: AC

## 2023-04-30 NOTE — ED Provider Notes (Signed)
[facility]    CSN: [id number] Arrival date & time: 04/30/23  1735      History   Chief Complaint Chief Complaint  Patient presents with   Leg Pain    LT    HPI Mallory Potts is a 57 y.o. female.   HPI 57 year old female presents with left leg pain.  Patient reports possible bug bite 2 days ago with pus pocket and is worried about infection.  PMH significant for morbid obesity, borderline personality disorder, and COPD.  Is accompanied by her friend this evening.  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   OCD (obsessive compulsive disorder)    Peripheral edema    Seizures (HCC)    Thyroid disease    Tobacco use disorder 05/09/2022    Patient Active Problem List   Diagnosis Date Noted   Cannabis use disorder 11/30/2022   Chronic obstructive lung disease (HCC) 10/18/2022   Type 2 diabetes mellitus (HCC) 10/18/2022   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   MDD 06/14/2022   Right lower lobe pneumonia 05/09/2022   Normocytic anemia 05/09/2022   Class II obesity 05/09/2022   Aortic atherosclerosis (HCC) 05/09/2022   Tobacco use disorder 05/09/2022   Generalized anxiety disorder 03/13/2022   PTSD (post-traumatic stress disorder) 03/13/2022   Long term (current) use of non-steroidal anti-inflammatories (nsaid) 08/08/2021   Proteinuria 08/08/2021   Microalbuminuria 06/29/2021   Stage 3b chronic kidney disease (HCC) 06/29/2021   Chronic kidney disease 08/20/2020   Hypothyroidism 08/20/2020   Insomnia 08/20/2020   Morbid obesity (HCC) 08/20/2020   Osteoarthritis 08/20/2020   Peripheral neuropathic  pain 08/20/2020   Rheumatoid arthritis (HCC) 08/20/2020   Seizure (HCC) 11/13/2014   Acute renal failure (HCC) 10/29/2014   Cellulitis of foot 10/24/2014   Hyponatremia 10/24/2014   Hypokalemia 10/24/2014   DM (diabetes mellitus), type 2, uncontrolled    Epilepsy (HCC)    COPD (chronic obstructive pulmonary disease) (HCC)    Borderline personality disorder (HCC)    Hypertension    Essential hypertension    Other epilepsy without status epilepticus, not intractable (HCC)     Past Surgical History:  Procedure Laterality Date   AMPUTATION Right 10/24/2014   Procedure: AMPUTATION RAY RIGHT SECOND TOE;  Surgeon: Beverely Low, MD;  Location: [facility];  Service: Orthopedics;  Laterality: Right;   AMPUTATION Right 10/27/2014   Procedure: RIGHT BELOW KNEE AMPUTATION;  Surgeon: Toni Arthurs, MD;  Location: [facility];  Service: Orthopedics;  Laterality: Right;   KNEE ARTHROSCOPY     TONSILLECTOMY      OB History   No obstetric history on file.      Home Medications    Prior to Admission medications   Medication Sig Start Date End Date Taking? Authorizing Provider  doxycycline (VIBRAMYCIN) 100 MG capsule Take 1 capsule (100 mg total) by mouth 2 (two) times daily for 10 days. 04/30/23 05/10/23 Yes Trevor Iha, FNP  ACCU-CHEK GUIDE test strip UP TO 4 TIMES A DAY 05/13/22   [provider]  Accu-Chek Softclix Lancets lancets USE AS DIRECTED 4 TIMES A DAY  05/13/22   [provider]  acetaminophen (TYLENOL) 500 MG tablet Take 500 mg by mouth daily as needed.    [provider]  acyclovir (ZOVIRAX) 400 MG tablet Take 2 tablets (800 mg total) by mouth 5 (five) times daily. 10/28/22   Prosperi, Christian H, PA-C  albuterol (PROVENTIL HFA;VENTOLIN HFA) 108 (90 BASE) MCG/ACT inhaler Inhale 2 puffs into the lungs every 6 (six) hours as needed for wheezing or shortness of breath. 11/08/11   Domenick Gong, MD  atorvastatin (LIPITOR) 40 MG tablet Take 40 mg by mouth every evening.     [provider]  blood glucose meter kit and supplies KIT Dispense based on patient and insurance preference. Use up to four times daily as directed. 05/13/22   Albertine Grates, MD  Capsaicin-Menthol-Methyl Sal (CAPSAICIN-METHYL SAL-MENTHOL) 0.025-1-12 % CREA Apply 1 Dose topically in the morning, at noon, in the evening, and at bedtime. 10/28/22   Prosperi, Christian H, PA-C  chlorhexidine (PERIDEX) 0.12 % solution Use as directed 15 mLs in the mouth or throat 2 (two) times daily. 11/15/22   Gailen Shelter, PA  Cholecalciferol (VITAMIN D3) 50 MCG (2000 UT) capsule Take 2,000 Units by mouth daily.    [provider]  empagliflozin (JARDIANCE) 25 MG TABS tablet Take 25 mg by mouth daily. 06/12/22   [provider]  famotidine (PEPCID) 20 MG tablet Take 20 mg by mouth 2 (two) times daily.    [provider]  fluticasone (FLONASE) 50 MCG/ACT nasal spray Place 1-2 sprays into both nostrils daily as needed (for seasonal allergies).    [provider]  fluticasone-salmeterol (ADVAIR) 250-50 MCG/ACT AEPB Inhale 1 puff into the lungs in the morning and at bedtime.    [provider]  gabapentin (NEURONTIN) 100 MG capsule Take 100-200 mg by mouth at bedtime. 09/26/22   [provider]  glipiZIDE (GLUCOTROL) 10 MG tablet Take 10 mg by mouth in the morning and at bedtime.    [provider]  hydrochlorothiazide (HYDRODIURIL) 12.5 MG tablet Take 1 tablet (12.5 mg total) by mouth daily. 05/16/22   Albertine Grates, MD  insulin aspart (NOVOLOG) 100 UNIT/ML FlexPen Insulin sliding scale: Blood sugar  120-150   3units                       151-200   4units                       201-250   7units                       251- 300  11units                       301-350   15uints                       351-400   20units                       >400         call MD immediately 05/13/22   Albertine Grates, MD  insulin detemir (LEVEMIR FLEXTOUCH) 100 UNIT/ML FlexPen Inject 10  Units into the skin daily. 05/13/22   Albertine Grates, MD  Insulin Pen Needle (PEN NEEDLES 3/16") 31G X 5 MM MISC For insulin dependent dm2 05/13/22   Albertine Grates, MD  levothyroxine (SYNTHROID, LEVOTHROID) 50  MCG tablet Take 50 mcg by mouth daily before breakfast.    [provider]  lidocaine (XYLOCAINE) 2 % solution Use as directed 15 mLs in the mouth or throat as needed for mouth pain. You can swish and spit, but safe to swallow as well 10/28/22   Prosperi, Christian H, PA-C  lisinopril (ZESTRIL) 2.5 MG tablet Take 2.5 mg by mouth daily.    [provider]  MUCINEX 600 MG 12 hr tablet Take 600 mg by mouth 2 (two) times daily as needed for cough or to loosen phlegm.    [provider]  Multiple Vitamins-Minerals (SENTRY ADULT PO) Sentry    [provider]  nicotine (NICODERM CQ - DOSED IN MG/24 HR) 7 mg/24hr patch Place 1 patch (7 mg total) onto the skin daily. 04/12/23 05/12/23  [name], DO  nicotine polacrilex (NICORETTE) 2 MG gum Take 1 each (2 mg total) by mouth as needed for smoking cessation. 04/12/23   [name], DO  oxyCODONE-acetaminophen (PERCOCET/ROXICET) 5-325 MG tablet Take 1 tablet by mouth every 6 (six) hours as needed for severe pain. 10/28/22   Prosperi, Christian H, PA-C  potassium chloride (MICRO-K) 10 MEQ CR capsule Take 20 mEq by mouth daily. 10/05/11   [provider]  sertraline (ZOLOFT) 100 MG tablet Take 1 tablet (100 mg total) by mouth in the morning. 04/12/23 07/11/23  [name], DO  traZODone (DESYREL) 50 MG tablet Take 0.5-1 tablets (25-50 mg total) by mouth at bedtime as needed for sleep. 04/12/23 07/11/23  [name], DO  spironolactone (ALDACTONE) 25 MG tablet Take 1 tablet (25 mg total) by mouth daily. 06/19/11 10/26/11  Devoria Albe, MD    Family History Family History  Problem Relation Age of Onset   Asthma Other    Diabetes Other    Hypertension Other     Social History Social History   Tobacco Use   Smoking  status: Every Day    Current packs/day: 0.50    Average packs/day: 0.5 packs/day for 5.0 years (2.5 ttl pk-yrs)    Types: Cigarettes  Substance Use Topics   Alcohol use: No    Comment: OCASSIONALLY   Drug use: No     Allergies   Lithium, Chantix [varenicline], Milk-related compounds, Other, Wellbutrin [bupropion], Abilify [aripiprazole], and Hydrocortisone   Review of Systems Review of Systems   Physical Exam Triage Vital Signs ED Triage Vitals  Encounter Vitals Group     BP 04/30/23 1814 (!) 152/71     Systolic BP Percentile --      Diastolic BP Percentile --      Pulse Rate 04/30/23 1814 71     Resp 04/30/23 1814 17     Temp 04/30/23 1814 98 F (36.7 C)     Temp Source 04/30/23 1814 Oral     SpO2 04/30/23 1814 97 %     Weight --      Height --      Head Circumference --      Peak Flow --      Pain Score 04/30/23 1816 7     Pain Loc --      Pain Education --      Exclude from Growth Chart --    No data found.  Updated Vital Signs BP 132/71   Pulse 71   Temp 98 F (36.7 C) (Oral)   Resp 17   LMP 10/09/2014 (Exact Date)   SpO2 97%      Physical Exam Vitals  and nursing note reviewed.  Constitutional:      Appearance: She is obese.  HENT:     Head: Normocephalic and atraumatic.     Mouth/Throat:     Mouth: Mucous membranes are moist.     Pharynx: Oropharynx is clear.  Eyes:     Extraocular Movements: Extraocular movements intact.     Conjunctiva/sclera: Conjunctivae normal.     Pupils: Pupils are equal, round, and reactive to light.  Cardiovascular:     Rate and Rhythm: Normal rate and regular rhythm.     Pulses: Normal pulses.     Heart sounds: Normal heart sounds.  Pulmonary:     Effort: Pulmonary effort is normal.     Breath sounds: Normal breath sounds. No wheezing, rhonchi or rales.  Musculoskeletal:        General: Normal range of motion.  Skin:    General: Skin is warm and dry.     Comments: Left lower leg (anterior aspect inferior to  patella): Erythematous mildly indurated, mildly fluctuant area-please see image below  Neurological:     General: No focal deficit present.     Mental Status: She is alert and oriented to person, place, and time. Mental status is at baseline.  Psychiatric:        Mood and Affect: Mood normal.        Behavior: Behavior normal.      UC Treatments / Results  Labs (all labs ordered are listed, but only abnormal results are displayed) Labs Reviewed - No data to display  EKG   Radiology No results found.  Procedures Procedures (including critical care time)  Medications Ordered in UC Medications - No data to display  Initial Impression / Assessment and Plan / UC Course  I have reviewed the triage vital signs and the nursing notes.  Pertinent labs & imaging results that were available during my care of the patient were reviewed by me and considered in my medical decision making (see chart for details).     MDM: 1.  Cellulitis of left lower leg-Rx'd doxycycline 100 mg capsule: Take 1 capsule twice daily x 10 days. Advised patient to take medication as directed with food to completion.  Encouraged to increase daily water intake to 64 ounces per day while taking this medication.  Advised if symptoms worsen and/or unresolved please follow-up with PCP or here for further evaluation.  Patient discharged home, hemodynamically stable. Final Clinical Impressions(s) / UC Diagnoses   Final diagnoses:  Cellulitis of left lower leg     Discharge Instructions      Advised patient to take medication as directed with food to completion.  Encouraged to increase daily water intake to 64 ounces per day while taking this medication.  Advised if symptoms worsen and/or unresolved please follow-up with PCP or here for further evaluation.     ED Prescriptions     Medication Sig Dispense Auth. Provider   doxycycline (VIBRAMYCIN) 100 MG capsule Take 1 capsule (100 mg total) by mouth 2 (two)  times daily for 10 days. 20 capsule Trevor Iha, FNP      PDMP not reviewed this encounter.   Trevor Iha, FNP 04/30/23 [phone]

## 2023-04-30 NOTE — ED Triage Notes (Addendum)
Pt c/o LT leg pain x 2 days. Had a pus pocket pop up, drained it. Redness increasing over last 24 hours. Worried about infection. Has had other leg amputated due to infection.

## 2023-04-30 NOTE — Discharge Instructions (Addendum)
 Advised patient to take medication as directed with food to completion.  Encouraged to increase daily water intake to 64 ounces per day while taking this medication.  Advised if symptoms worsen and/or unresolved please follow-up with PCP or here for further evaluation.

## 2023-05-09 DIAGNOSIS — E119 Type 2 diabetes mellitus without complications: Secondary | ICD-10-CM | POA: Diagnosis not present

## 2023-05-09 DIAGNOSIS — R109 Unspecified abdominal pain: Secondary | ICD-10-CM | POA: Diagnosis not present

## 2023-05-09 DIAGNOSIS — J449 Chronic obstructive pulmonary disease, unspecified: Secondary | ICD-10-CM | POA: Diagnosis not present

## 2023-05-09 DIAGNOSIS — I10 Essential (primary) hypertension: Secondary | ICD-10-CM | POA: Diagnosis not present

## 2023-05-09 DIAGNOSIS — G8929 Other chronic pain: Secondary | ICD-10-CM | POA: Diagnosis not present

## 2023-05-09 DIAGNOSIS — N289 Disorder of kidney and ureter, unspecified: Secondary | ICD-10-CM | POA: Diagnosis not present

## 2023-05-09 DIAGNOSIS — Z89511 Acquired absence of right leg below knee: Secondary | ICD-10-CM | POA: Diagnosis not present

## 2023-05-09 DIAGNOSIS — K6389 Other specified diseases of intestine: Secondary | ICD-10-CM | POA: Diagnosis not present

## 2023-05-09 DIAGNOSIS — Z87891 Personal history of nicotine dependence: Secondary | ICD-10-CM | POA: Diagnosis not present

## 2023-05-09 DIAGNOSIS — I700 Atherosclerosis of aorta: Secondary | ICD-10-CM | POA: Diagnosis not present

## 2023-05-09 DIAGNOSIS — K439 Ventral hernia without obstruction or gangrene: Secondary | ICD-10-CM | POA: Diagnosis not present

## 2023-05-09 DIAGNOSIS — K429 Umbilical hernia without obstruction or gangrene: Secondary | ICD-10-CM | POA: Diagnosis not present

## 2023-05-10 DIAGNOSIS — K429 Umbilical hernia without obstruction or gangrene: Secondary | ICD-10-CM | POA: Diagnosis not present

## 2023-05-15 DIAGNOSIS — K219 Gastro-esophageal reflux disease without esophagitis: Secondary | ICD-10-CM | POA: Diagnosis not present

## 2023-05-15 DIAGNOSIS — K429 Umbilical hernia without obstruction or gangrene: Secondary | ICD-10-CM | POA: Diagnosis not present

## 2023-05-15 DIAGNOSIS — I10 Essential (primary) hypertension: Secondary | ICD-10-CM | POA: Diagnosis not present

## 2023-05-22 DIAGNOSIS — E559 Vitamin D deficiency, unspecified: Secondary | ICD-10-CM | POA: Diagnosis not present

## 2023-05-22 DIAGNOSIS — I10 Essential (primary) hypertension: Secondary | ICD-10-CM | POA: Diagnosis not present

## 2023-05-22 DIAGNOSIS — R809 Proteinuria, unspecified: Secondary | ICD-10-CM | POA: Diagnosis not present

## 2023-05-22 DIAGNOSIS — Z89511 Acquired absence of right leg below knee: Secondary | ICD-10-CM | POA: Diagnosis not present

## 2023-05-22 DIAGNOSIS — E1165 Type 2 diabetes mellitus with hyperglycemia: Secondary | ICD-10-CM | POA: Diagnosis not present

## 2023-05-22 DIAGNOSIS — N184 Chronic kidney disease, stage 4 (severe): Secondary | ICD-10-CM | POA: Diagnosis not present

## 2023-05-22 DIAGNOSIS — E1122 Type 2 diabetes mellitus with diabetic chronic kidney disease: Secondary | ICD-10-CM | POA: Diagnosis not present

## 2023-05-22 DIAGNOSIS — E039 Hypothyroidism, unspecified: Secondary | ICD-10-CM | POA: Diagnosis not present

## 2023-05-22 DIAGNOSIS — E782 Mixed hyperlipidemia: Secondary | ICD-10-CM | POA: Diagnosis not present

## 2023-05-22 DIAGNOSIS — D631 Anemia in chronic kidney disease: Secondary | ICD-10-CM | POA: Diagnosis not present

## 2023-05-24 ENCOUNTER — Ambulatory Visit (HOSPITAL_COMMUNITY): Payer: Medicare HMO | Admitting: Mental Health

## 2023-05-30 ENCOUNTER — Ambulatory Visit: Payer: Self-pay | Admitting: Surgery

## 2023-05-30 NOTE — H&P (Signed)
 Subjective   Chief Complaint: New Consultation and Umbilical Hernia     History of Present Illness: Mallory Potts is a 58 y.o. female who is seen today as an office consultation at the request of Dr. Anastasio for evaluation of New Consultation and Umbilical Hernia .    This is a 58 year old female with multiple medical issues including obesity, diabetes, chronic kidney disease, COPD who has had at least a 20-year history of an umbilical hernia.  This has enlarged slightly.  Recently has been causing more discomfort.  She denies any obstructive symptoms.  She was seen in the emergency department at [facility].  A CT scan there apparently showed a fat-containing umbilical hernia with no sign of bowel obstruction.  She presents now to discuss repair.  The patient quit smoking a couple months ago.  Her weight has decreased slightly over the last year.   Review of Systems: A complete review of systems was obtained from the patient.  I have reviewed this information and discussed as appropriate with the patient.  See HPI as well for other ROS.  Review of Systems  Constitutional: Negative.   HENT:  Positive for tinnitus.   Eyes: Negative.   Respiratory: Negative.    Cardiovascular:  Positive for leg swelling.  Gastrointestinal: Negative.   Genitourinary: Negative.   Musculoskeletal: Negative.   Skin: Negative.   Neurological: Negative.   Endo/Heme/Allergies: Negative.   Psychiatric/Behavioral:  The patient is nervous/anxious.       Medical History: Past Medical History:  Diagnosis Date   Anemia    Anxiety    Arthritis    Asthma, unspecified asthma severity, unspecified whether complicated, unspecified whether persistent (HHS-HCC)    Chronic kidney disease    COPD (chronic obstructive pulmonary disease) (CMS/HHS-HCC)    Diabetes mellitus without complication (CMS/HHS-HCC)    GERD (gastroesophageal reflux disease)    Hyperlipidemia    Thyroid  disease     Patient Active Problem  List  Diagnosis   Aortic atherosclerosis (CMS-HCC)   Stage 4 chronic kidney disease (CMS/HHS-HCC)   Chronic obstructive pulmonary disease (CMS/HHS-HCC)   Class II obesity   Generalized anxiety disorder   Essential hypertension   Epilepsy (CMS/HHS-HCC)   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CMS/HHS-HCC)   Osteoarthritis   Rheumatoid arthritis (CMS/HHS-HCC)   Type 2 diabetes mellitus without complication (CMS/HHS-HCC)    History reviewed. No pertinent surgical history.   Allergies  Allergen Reactions   Lithium Other (See Comments), Rash and Shortness Of Breath    reaction   Bupropion Nausea and Other (See Comments)    reaction  I lose all emotions = zombie   Varenicline Nausea and Other (See Comments)    Negative emotions   Aripiprazole Rash   Hydrocortisone Rash    Current Outpatient Medications on File Prior to Visit  Medication Sig Dispense Refill   doxycycline  (VIBRAMYCIN ) 100 MG capsule Take 100 mg by mouth 2 (two) times daily     nicotine  polacrilex (NICORETTE ) 2 mg gum Take 2 mg by mouth     sertraline  (ZOLOFT ) 100 MG tablet Take 100 mg by mouth once daily     multivitamin tablet Take 1 tablet by mouth once daily     traZODone  (DESYREL ) 50 MG tablet Take 50 mg by mouth at bedtime     No current facility-administered medications on file prior to visit.    Family History  Problem Relation Age of Onset   Stroke Mother    Obesity Mother    High blood  pressure (Hypertension) Mother    Hyperlipidemia (Elevated cholesterol) Mother    Diabetes Mother    Deep vein thrombosis (DVT or abnormal blood clot formation) Mother    Breast cancer Mother    Obesity Father    High blood pressure (Hypertension) Father    Hyperlipidemia (Elevated cholesterol) Father    Coronary Artery Disease (Blocked arteries around heart) Father    Diabetes Father    Skin cancer Sister    Obesity Sister    High blood pressure (Hypertension) Sister    Diabetes Sister    Coronary  Artery Disease (Blocked arteries around heart) Sister    Obesity Brother    High blood pressure (Hypertension) Brother    Diabetes Brother      Social History   Tobacco Use  Smoking Status Former   Types: Cigarettes  Smokeless Tobacco Never     Social History   Socioeconomic History   Marital status: Single  Tobacco Use   Smoking status: Former    Types: Cigarettes   Smokeless tobacco: Never  Substance and Sexual Activity   Alcohol use: Yes   Drug use: Never   Social Drivers of Corporate Investment Banker Strain: Medium Risk (08/30/2022)   Received from [facility]   Overall Financial Resource Strain (CARDIA)    Difficulty of Paying Living Expenses: Somewhat hard  Food Insecurity: No Food Insecurity (05/10/2022)   [facility]   Hunger Vital Sign    Worried About Running Out of Food in the Last Year: Never true    Ran Out of Food in the Last Year: Never true  Transportation Needs: No Transportation Needs (05/10/2022)   Received from [facility] - Transportation    Lack of Transportation (Medical): No    Lack of Transportation (Non-Medical): No  Physical Activity: Insufficiently Active (08/30/2022)   Received from [facility]   Exercise Vital Sign    Days of Exercise per Week: 7 days    Minutes of Exercise per Session: 20 min  Stress: Stress Concern Present (08/30/2022)   Received from [facility] of Occupational [facility] - Occupational Stress Questionnaire    Feeling of Stress : Very much  Social Connections: Moderately Isolated (08/30/2022)   Received from [facility]   Social Connection and Isolation Panel [NHANES]    Frequency of Communication with Friends and Family: More than three times a week    Frequency of Social Gatherings with Friends and Family: Once a week    Attends Religious Services: 1 to 4 times per year    Active Member of [employer] or Organizations: No    Attends Banker Meetings: Never     Marital Status: Never married  Housing Stability: High Risk (05/30/2023)   Housing Stability Vital Sign    Homeless in the Last Year: Yes    Objective:    Vitals:   05/30/23 1437  BP: (!) 170/63  Pulse: 76  Temp: 36.1 C (97 F)  SpO2: 96%  Weight: 98.9 kg (218 lb)  Height: 160 cm (5' 3)    Body mass index is 38.62 kg/m.  Physical Exam   Constitutional:  WDWN in NAD, conversant, no obvious deformities; lying in bed comfortably Eyes:  Pupils equal, round; sclera anicteric; moist conjunctiva; no lid lag HENT:  Oral mucosa moist; good dentition  Neck:  No masses palpated, trachea midline; no thyromegaly Lungs:  CTA bilaterally; normal respiratory effort CV:  Regular rate and rhythm;  no murmurs; extremities well-perfused with no edema Abd:  +bowel sounds, soft, non-tender, no palpable organomegaly; protruding umbilical hernia that is reducible when supine.  The fascial defect is approximately 3 cm in diameter. Musc:  Ambulates with a cane/ prosthetic; no apparent clubbing or cyanosis in extremities Lymphatic:  No palpable cervical or axillary lymphadenopathy Skin:  Warm, dry; no sign of jaundice Psychiatric - alert and oriented x 4; calm mood and affect   Labs, Imaging and Diagnostic Testing: CLINICAL DATA:  Abdominal pain, acute nonlocalized. Vomiting for 2 days with right flank pain. Flu last week.   EXAM: CT ABDOMEN AND PELVIS WITHOUT CONTRAST   TECHNIQUE: Multidetector CT imaging of the abdomen and pelvis was performed following the standard protocol without IV contrast.   RADIATION DOSE REDUCTION: This exam was performed according to the departmental dose-optimization program which includes automated exposure control, adjustment of the mA and/or kV according to patient size and/or use of iterative reconstruction technique.   COMPARISON:  Abdominopelvic CT 10/29/2012.   FINDINGS: Lower chest: Mild aortic atherosclerosis and a small hiatal hernia. There is  dependent right lower lobe airspace disease suspicious for pneumonia. There is a small right lower lobe calcified granuloma. The left lung base appears clear. No significant pleural or pericardial effusion.   Hepatobiliary: Suspected mild hepatic steatosis without evidence of focal abnormality on noncontrast imaging. The gallbladder is incompletely distended. No evidence of gallstones, gallbladder wall thickening or biliary dilatation.   Pancreas: Unremarkable. No pancreatic ductal dilatation or surrounding inflammatory changes.   Spleen: Normal in size without focal abnormality.   Adrenals/Urinary Tract: Both adrenal glands appear normal. Increased perinephric soft tissue stranding, left greater than right. No evidence of focal perinephric fluid collection, urinary tract calculus or hydronephrosis. Mild bladder distension. There is a small amount of air in the urinary bladder which may be iatrogenic. No other bladder abnormality identified.   Stomach/Bowel: No enteric contrast administered. The stomach appears unremarkable for its degree of distension. No evidence of bowel wall thickening, distention or surrounding inflammatory change. The appendix appears normal. Mild sigmoid diverticulosis.   Vascular/Lymphatic: There are no enlarged abdominal or pelvic lymph nodes. Aortic and branch vessel atherosclerosis without evidence of aneurysm.   Reproductive: The uterus and ovaries appear normal. No adnexal mass.   Other: 5.5 cm umbilical hernia containing only fat. No herniated bowel, ascites or free air.   Musculoskeletal: No acute or significant osseous findings. 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   IMPRESSION: 1. Right lower lobe airspace disease suspicious for pneumonia. Recommend chest radiographic follow-up to radiographic resolution. 2. Nonspecific perinephric soft  tissue stranding, left greater than right. No evidence of urinary tract calculus or hydronephrosis. Small amount of air in the urinary bladder, likely iatrogenic. 3. Umbilical hernia containing only fat, minimally enlarged from remote prior CT. 4. 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 5.  Aortic Atherosclerosis (ICD10-I70.0).     Electronically Signed   By: Elsie Perone M.D.   On: 05/09/2022 10:58    Assessment and Plan:  Diagnoses and all orders for this visit:  Umbilical hernia without obstruction or gangrene    Recommend umbilical hernia repair with mesh.The surgical procedure has been discussed with the patient.  Potential risks, benefits, alternative treatments, and expected outcomes have been explained.  All of the patient's questions at this time have been answered.  The likelihood  of reaching the patient's treatment goal is good.  The patient understands the proposed surgical procedure and wishes to proceed.    Mallory Fojtik DEWAYNE LIMA, MD  05/30/2023 2:54 PM

## 2023-06-11 ENCOUNTER — Encounter (HOSPITAL_COMMUNITY): Payer: Self-pay

## 2023-06-11 ENCOUNTER — Ambulatory Visit (HOSPITAL_COMMUNITY): Payer: Medicare HMO | Admitting: Mental Health

## 2023-06-28 NOTE — Pre-Procedure Instructions (Signed)
 Surgical Instructions   Your procedure is scheduled on Thursday, July 05, 2023. Report to [facility] Main Entrance A at 6:00 A.M., then check in with the Admitting office. Any questions or running late day of surgery: call [phone]  Questions prior to your surgery date: call [phone], Monday-Friday, 8am-4pm. If you experience any cold or flu symptoms such as cough, fever, chills, shortness of breath, etc. between now and your scheduled surgery, please notify us  at the above number.     Remember:  Do not eat after midnight the night before your surgery   You may drink clear liquids until 5:00am the morning of your surgery.   Clear liquids allowed are: Water, Non-Citrus Juices (without pulp), Carbonated Beverages, Clear Tea (no milk, honey, etc.), Black Coffee Only (NO MILK, CREAM OR POWDERED CREAMER of any kind), and Gatorade.    Take these medicines the morning of surgery with A SIP OF WATER : Fluticasone -Salmeterol (Advair) Gabapentin  (Neurontin ) Levothyroxine  (Synthroid ) Sertraline  (Zoloft )  May take these medicines IF NEEDED: Acetaminophen  (Tylenol ) Albuterol  (Proventil  HFA;Ventolin  HFA) inhaler Fluticasone  (Flonase ) nasal spray   One week prior to surgery, STOP taking any Aspirin (unless otherwise instructed by your surgeon) Aleve, Naproxen, Ibuprofen, Motrin, Advil, Goody's, BC's, all herbal medications, fish oil, and non-prescription vitamins.   WHAT DO I DO ABOUT MY DIABETES MEDICATION?   THE NIGHT BEFORE SURGERY, take a HALF DOSE of your Insulin  Detemir (Levemir ) insulin  which would be 5 units.    Do not take oral diabetes medicines (pills) the morning of surgery. DO NOT take Glipizide  (Glucotrol ) the morning of surgery.  If your CBG is greater than 220 mg/dL the morning of surgery, you may take  of your sliding scale (correction) dose of insulin .   HOW TO MANAGE YOUR DIABETES BEFORE AND AFTER SURGERY  Why is it important to control my blood sugar  before and after surgery? Improving blood sugar levels before and after surgery helps healing and can limit problems. A way of improving blood sugar control is eating a healthy diet by:  Eating less sugar and carbohydrates  Increasing activity/exercise  Talking with your doctor about reaching your blood sugar goals High blood sugars (greater than 180 mg/dL) can raise your risk of infections and slow your recovery, so you will need to focus on controlling your diabetes during the weeks before surgery. Make sure that the doctor who takes care of your diabetes knows about your planned surgery including the date and location.  How do I manage my blood sugar before surgery? Check your blood sugar at least 4 times a day, starting 2 days before surgery, to make sure that the level is not too high or low.  Check your blood sugar the morning of your surgery when you wake up and every 2 hours until you get to the Short Stay unit.  If your blood sugar is less than 70 mg/dL, you will need to treat for low blood sugar: Do not take insulin . Treat a low blood sugar (less than 70 mg/dL) with  cup of clear juice (cranberry or apple), 4 glucose tablets, OR glucose gel. Recheck blood sugar in 15 minutes after treatment (to make sure it is greater than 70 mg/dL). If your blood sugar is not greater than 70 mg/dL on recheck, call [phone] for further instructions. Report your blood sugar to the short stay nurse when you get to Short Stay.  If you are admitted to the hospital after surgery: Your blood sugar will be checked by the  staff and you will probably be given insulin  after surgery (instead of oral diabetes medicines) to make sure you have good blood sugar levels. The goal for blood sugar control after surgery is 80-180 mg/dL.                      Do NOT Smoke (Tobacco/Vaping) for 24 hours prior to your procedure.  If you use a CPAP at night, you may bring your mask/headgear for your overnight  stay.   You will be asked to remove any contacts, glasses, piercing's, hearing aid's, dentures/partials prior to surgery. Please bring cases for these items if needed.    Patients discharged the day of surgery will not be allowed to drive home, and someone needs to stay with them for 24 hours.  SURGICAL WAITING ROOM VISITATION Patients may have no more than 2 support people in the waiting area - these visitors may rotate.   Pre-op nurse will coordinate an appropriate time for 1 ADULT support person, who may not rotate, to accompany patient in pre-op.  Children under the age of 26 must have an adult with them who is not the patient and must remain in the main waiting area with an adult.  If the patient needs to stay at the hospital during part of their recovery, the visitor guidelines for inpatient rooms apply.  Please refer to the [facility] website for the visitor guidelines for any additional information.   If you received a COVID test during your pre-op visit  it is requested that you wear a mask when out in public, stay away from anyone that may not be feeling well and notify your surgeon if you develop symptoms. If you have been in contact with anyone that has tested positive in the last 10 days please notify you surgeon.      Pre-operative CHG Bathing Instructions   You can play a key role in reducing the risk of infection after surgery. Your skin needs to be as free of germs as possible. You can reduce the number of germs on your skin by washing with CHG (chlorhexidine  gluconate) soap before surgery. CHG is an antiseptic soap that kills germs and continues to kill germs even after washing.   DO NOT use if you have an allergy to chlorhexidine /CHG or antibacterial soaps. If your skin becomes reddened or irritated, stop using the CHG and notify one of our RNs at [phone].              TAKE A SHOWER THE NIGHT BEFORE SURGERY AND THE DAY OF SURGERY    Please keep in mind the  following:  DO NOT shave, including legs and underarms, 48 hours prior to surgery.   You may shave your face before/day of surgery.  Place clean sheets on your bed the night before surgery Use a clean washcloth (not used since being washed) for each shower. DO NOT sleep with pet's night before surgery.  CHG Shower Instructions:  Wash your face and private area with normal soap. If you choose to wash your hair, wash first with your normal shampoo.  After you use shampoo/soap, rinse your hair and body thoroughly to remove shampoo/soap residue.  Turn the water OFF and apply half the bottle of CHG soap to a CLEAN washcloth.  Apply CHG soap ONLY FROM YOUR NECK DOWN TO YOUR TOES (washing for 3-5 minutes)  DO NOT use CHG soap on face, private areas, open wounds, or sores.  Pay special  attention to the area where your surgery is being performed.  If you are having back surgery, having someone wash your back for you may be helpful. Wait 2 minutes after CHG soap is applied, then you may rinse off the CHG soap.  Pat dry with a clean towel  Put on clean pajamas    Additional instructions for the day of surgery: DO NOT APPLY any lotions, deodorants, cologne, or perfumes.   Do not wear jewelry or makeup Do not wear nail polish, gel polish, artificial nails, or any other type of covering on natural nails (fingers and toes) Do not bring valuables to the hospital. [facility] is not responsible for valuables/personal belongings. Put on clean/comfortable clothes.  Please brush your teeth.  Ask your nurse before applying any prescription medications to the skin.

## 2023-06-29 ENCOUNTER — Encounter (HOSPITAL_COMMUNITY): Payer: Self-pay

## 2023-06-29 ENCOUNTER — Other Ambulatory Visit: Payer: Self-pay

## 2023-06-29 ENCOUNTER — Encounter (HOSPITAL_COMMUNITY)
Admission: RE | Admit: 2023-06-29 | Discharge: 2023-06-29 | Disposition: A | Discharge status: Home / Self Care | Payer: Medicare HMO | Source: Ambulatory Visit | Attending: Surgery

## 2023-06-29 VITALS — BP 139/65 | HR 63 | Temp 98.5°F | Resp 18 | Ht 63.0 in | Wt 214.6 lb

## 2023-06-29 DIAGNOSIS — N189 Chronic kidney disease, unspecified: Secondary | ICD-10-CM | POA: Insufficient documentation

## 2023-06-29 DIAGNOSIS — J4489 Other specified chronic obstructive pulmonary disease: Secondary | ICD-10-CM | POA: Diagnosis not present

## 2023-06-29 DIAGNOSIS — E039 Hypothyroidism, unspecified: Secondary | ICD-10-CM | POA: Insufficient documentation

## 2023-06-29 DIAGNOSIS — F319 Bipolar disorder, unspecified: Secondary | ICD-10-CM | POA: Insufficient documentation

## 2023-06-29 DIAGNOSIS — I129 Hypertensive chronic kidney disease with stage 1 through stage 4 chronic kidney disease, or unspecified chronic kidney disease: Secondary | ICD-10-CM | POA: Insufficient documentation

## 2023-06-29 DIAGNOSIS — Z87891 Personal history of nicotine dependence: Secondary | ICD-10-CM | POA: Insufficient documentation

## 2023-06-29 DIAGNOSIS — F429 Obsessive-compulsive disorder, unspecified: Secondary | ICD-10-CM | POA: Insufficient documentation

## 2023-06-29 DIAGNOSIS — I4510 Unspecified right bundle-branch block: Secondary | ICD-10-CM | POA: Diagnosis not present

## 2023-06-29 DIAGNOSIS — Z01818 Encounter for other preprocedural examination: Secondary | ICD-10-CM | POA: Insufficient documentation

## 2023-06-29 DIAGNOSIS — E1122 Type 2 diabetes mellitus with diabetic chronic kidney disease: Secondary | ICD-10-CM | POA: Diagnosis not present

## 2023-06-29 DIAGNOSIS — Z794 Long term (current) use of insulin: Secondary | ICD-10-CM | POA: Insufficient documentation

## 2023-06-29 DIAGNOSIS — K429 Umbilical hernia without obstruction or gangrene: Secondary | ICD-10-CM | POA: Insufficient documentation

## 2023-06-29 HISTORY — DX: Gastro-esophageal reflux disease without esophagitis: K21.9

## 2023-06-29 HISTORY — DX: Anemia, unspecified: D64.9

## 2023-06-29 HISTORY — DX: Hypothyroidism, unspecified: E03.9

## 2023-06-29 HISTORY — DX: Chronic kidney disease, unspecified: N18.9

## 2023-06-29 LAB — BASIC METABOLIC PANEL
Anion gap: 10 (ref 5–15)
BUN: 42 mg/dL — ABNORMAL HIGH (ref 6–20)
CO2: 23 mmol/L (ref 22–32)
Calcium: 9 mg/dL (ref 8.9–10.3)
Chloride: 108 mmol/L (ref 98–111)
Creatinine, Ser: 1.99 mg/dL — ABNORMAL HIGH (ref 0.44–1.00)
GFR, Estimated: 29 mL/min — ABNORMAL LOW (ref >60)
Glucose, Bld: 104 mg/dL — ABNORMAL HIGH (ref 70–99)
Potassium: 5.1 mmol/L (ref 3.5–5.1)
Sodium: 141 mmol/L (ref 135–145)

## 2023-06-29 LAB — CBC
HCT: 33.5 % — ABNORMAL LOW (ref 36.0–46.0)
Hemoglobin: 10.5 g/dL — ABNORMAL LOW (ref 12.0–15.0)
MCH: 30.4 pg (ref 26.0–34.0)
MCHC: 31.3 g/dL (ref 30.0–36.0)
MCV: 97.1 fL (ref 80.0–100.0)
Platelets: 195 10*3/uL (ref 150–400)
RBC: 3.45 MIL/uL — ABNORMAL LOW (ref 3.87–5.11)
RDW: 14.1 % (ref 11.5–15.5)
WBC: 7.1 10*3/uL (ref 4.0–10.5)
nRBC: 0 % (ref 0.0–0.2)

## 2023-06-29 LAB — HEMOGLOBIN A1C
Hgb A1c MFr Bld: 7 % — ABNORMAL HIGH (ref 4.8–5.6)
Mean Plasma Glucose: 154.2 mg/dL

## 2023-06-29 LAB — GLUCOSE, CAPILLARY: Glucose-Capillary: 112 mg/dL — ABNORMAL HIGH (ref 70–99)

## 2023-06-29 NOTE — Pre-Procedure Instructions (Signed)
 Surgical Instructions   Your procedure is scheduled on Thursday, July 05, 2023. Report to [facility] Main Entrance A at 6:00 A.M., then check in with the Admitting office. Any questions or running late day of surgery: call [phone]  Questions prior to your surgery date: call [phone], Monday-Friday, 8am-4pm. If you experience any cold or flu symptoms such as cough, fever, chills, shortness of breath, etc. between now and your scheduled surgery, please notify us  at the above number.     Remember:  Do not eat after midnight the night before your surgery   You may drink clear liquids until 5:00am the morning of your surgery.   Clear liquids allowed are: Water, Non-Citrus Juices (without pulp), Carbonated Beverages, Clear Tea (no milk, honey, etc.), Black Coffee Only (NO MILK, CREAM OR POWDERED CREAMER of any kind), and Gatorade.    Take these medicines the morning of surgery with A SIP OF WATER : Fluticasone -Salmeterol (Advair) Gabapentin  (Neurontin ) Levothyroxine  (Synthroid ) Sertraline  (Zoloft )  May take these medicines IF NEEDED: Acetaminophen  (Tylenol ) Albuterol  (Proventil  HFA;Ventolin  HFA) inhaler Fluticasone  (Flonase ) nasal spray   One week prior to surgery, STOP taking any Aspirin (unless otherwise instructed by your surgeon) Aleve, Naproxen, Ibuprofen, Motrin, Advil, Goody's, BC's, all herbal medications, fish oil, and non-prescription vitamins.   WHAT DO I DO ABOUT MY DIABETES MEDICATION?   THE MORNING OFSURGERY, take a HALF DOSE of your Insulin  Detemir (Levemir ) insulin  which would be 5 units.    Do not take oral diabetes medicines (pills) the morning of surgery. DO NOT take Glipizide  (Glucotrol ) the morning of surgery.  If your CBG is greater than 220 mg/dL the morning of surgery, you may take  of your sliding scale (correction) dose of insulin .   HOW TO MANAGE YOUR DIABETES BEFORE AND AFTER SURGERY  Why is it important to control my blood sugar  before and after surgery? Improving blood sugar levels before and after surgery helps healing and can limit problems. A way of improving blood sugar control is eating a healthy diet by:  Eating less sugar and carbohydrates  Increasing activity/exercise  Talking with your doctor about reaching your blood sugar goals High blood sugars (greater than 180 mg/dL) can raise your risk of infections and slow your recovery, so you will need to focus on controlling your diabetes during the weeks before surgery. Make sure that the doctor who takes care of your diabetes knows about your planned surgery including the date and location.  How do I manage my blood sugar before surgery? Check your blood sugar at least 4 times a day, starting 2 days before surgery, to make sure that the level is not too high or low.  Check your blood sugar the morning of your surgery when you wake up and every 2 hours until you get to the Short Stay unit.  If your blood sugar is less than 70 mg/dL, you will need to treat for low blood sugar: Do not take insulin . Treat a low blood sugar (less than 70 mg/dL) with  cup of clear juice (cranberry or apple), 4 glucose tablets, OR glucose gel. Recheck blood sugar in 15 minutes after treatment (to make sure it is greater than 70 mg/dL). If your blood sugar is not greater than 70 mg/dL on recheck, call [phone] for further instructions. Report your blood sugar to the short stay nurse when you get to Short Stay.  If you are admitted to the hospital after surgery: Your blood sugar will be checked by the staff  and you will probably be given insulin  after surgery (instead of oral diabetes medicines) to make sure you have good blood sugar levels. The goal for blood sugar control after surgery is 80-180 mg/dL.                      Do NOT Smoke (Tobacco/Vaping) for 24 hours prior to your procedure.  If you use a CPAP at night, you may bring your mask/headgear for your overnight  stay.   You will be asked to remove any contacts, glasses, piercing's, hearing aid's, dentures/partials prior to surgery. Please bring cases for these items if needed.    Patients discharged the day of surgery will not be allowed to drive home, and someone needs to stay with them for 24 hours.  SURGICAL WAITING ROOM VISITATION Patients may have no more than 2 support people in the waiting area - these visitors may rotate.   Pre-op nurse will coordinate an appropriate time for 1 ADULT support person, who may not rotate, to accompany patient in pre-op.  Children under the age of 63 must have an adult with them who is not the patient and must remain in the main waiting area with an adult.  If the patient needs to stay at the hospital during part of their recovery, the visitor guidelines for inpatient rooms apply.  Please refer to the [facility] website for the visitor guidelines for any additional information.   If you received a COVID test during your pre-op visit  it is requested that you wear a mask when out in public, stay away from anyone that may not be feeling well and notify your surgeon if you develop symptoms. If you have been in contact with anyone that has tested positive in the last 10 days please notify you surgeon.      Pre-operative CHG Bathing Instructions   You can play a key role in reducing the risk of infection after surgery. Your skin needs to be as free of germs as possible. You can reduce the number of germs on your skin by washing with CHG (chlorhexidine  gluconate) soap before surgery. CHG is an antiseptic soap that kills germs and continues to kill germs even after washing.   DO NOT use if you have an allergy to chlorhexidine /CHG or antibacterial soaps. If your skin becomes reddened or irritated, stop using the CHG and notify one of our RNs at [phone].              TAKE A SHOWER THE NIGHT BEFORE SURGERY AND THE DAY OF SURGERY    Please keep in mind the  following:  DO NOT shave, including legs and underarms, 48 hours prior to surgery.   You may shave your face before/day of surgery.  Place clean sheets on your bed the night before surgery Use a clean washcloth (not used since being washed) for each shower. DO NOT sleep with pet's night before surgery.  CHG Shower Instructions:  Wash your face and private area with normal soap. If you choose to wash your hair, wash first with your normal shampoo.  After you use shampoo/soap, rinse your hair and body thoroughly to remove shampoo/soap residue.  Turn the water OFF and apply half the bottle of CHG soap to a CLEAN washcloth.  Apply CHG soap ONLY FROM YOUR NECK DOWN TO YOUR TOES (washing for 3-5 minutes)  DO NOT use CHG soap on face, private areas, open wounds, or sores.  Pay special attention  to the area where your surgery is being performed.  If you are having back surgery, having someone wash your back for you may be helpful. Wait 2 minutes after CHG soap is applied, then you may rinse off the CHG soap.  Pat dry with a clean towel  Put on clean pajamas    Additional instructions for the day of surgery: DO NOT APPLY any lotions, deodorants, cologne, or perfumes.   Do not wear jewelry or makeup Do not wear nail polish, gel polish, artificial nails, or any other type of covering on natural nails (fingers and toes) Do not bring valuables to the hospital. [facility] is not responsible for valuables/personal belongings. Put on clean/comfortable clothes.  Please brush your teeth.  Ask your nurse before applying any prescription medications to the skin.

## 2023-06-29 NOTE — Progress Notes (Signed)
 PCP - Rockey Anastasio COME Cardiologist - denies  PPM/ICD - denies Device Orders -  Rep Notified -   Chest x-ray - 05/09/22 EKG - 06/29/23 Stress Test - denies ECHO - 08/24/11 Cardiac Cath - denies  Sleep Study - denies CPAP -   Fasting Blood Sugar - 80-90 Checks Blood Sugar- has Freestyle Libre  Last dose of GLP1 agonist-  na GLP1 instructions:   Blood Thinner Instructions:na Aspirin Instructions:na  ERAS Protcol -clears until 0500 PRE-SURGERY Ensure or G2- no  COVID TEST- na   Anesthesia review: yes-abnormal labs- creatinine -1.99  Patient denies shortness of breath, fever, cough and chest pain at PAT appointment   All instructions explained to the patient, with a verbal understanding of the material. Patient agrees to go over the instructions while at home for a better understanding. Patient also instructed to wear a mask when out in public prior to surgery. The opportunity to ask questions was provided.

## 2023-07-02 NOTE — Progress Notes (Signed)
 Anesthesia Chart Review:  Case: [id number] Date/Time: 07/05/23 0745   Procedure: HERNIA REPAIR UMBILICAL ADULT WITH MESH   Anesthesia type: General   Pre-op diagnosis: UMBILICAL HERNIA   [facility]: [facility] ROOM 02 / [facility] OR   Surgeons: Belinda Cough, MD       DISCUSSION: Patient is a 58 year old female scheduled for the above procedure. ED visit 05/09/23 for symptomatic umbilical hernia. CT scan showed fat-containing umbilical hernia and was referred to general surgery regarding definitive care.   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  06/29/23 EKG showed SB at 54 bpm with known right BBB, LAFB. Labs showed BUN 42, Creatinine 1.99 which is consistent with prior results. Glucose was 104. A1c 7.0%. (She has a Freestyle Libre CGM.)  H/H 10.5/335 which also seems overall consistent with lab trends.    Anesthesia team to evaluate on the day of surgery.    VS: BP 139/65   Pulse 63   Temp 36.9 C (Oral)   Resp 18   Ht 5' 3 (1.6 m)   Wt 97.3 kg   LMP 10/09/2014 (Exact Date)   SpO2 97%   BMI 38.01 kg/m   PROVIDERS: Anastasio Duncans, DO (Inactive) Lufadeju, Adetoye, MD is nephrologist ([facility])  LABS: Preoperative labs noted and appear stable with known history/trends. See DISCUSSION.  (all labs ordered are listed, but only abnormal results are displayed)  Labs Reviewed  GLUCOSE, CAPILLARY - Abnormal; Notable for the following components:      Result Value   Glucose-Capillary 112 (*)    All other components within normal limits  HEMOGLOBIN A1C - Abnormal; Notable for the following components:   Hgb A1c MFr Bld 7.0 (*)    All other components within normal limits  BASIC METABOLIC PANEL - Abnormal; Notable for the following components:   Glucose, Bld 104 (*)    BUN 42 (*)    Creatinine, Ser 1.99 (*)    GFR, Estimated 29 (*)    All other components within normal  limits  CBC - Abnormal; Notable for the following components:   RBC 3.45 (*)    Hemoglobin 10.5 (*)    HCT 33.5 (*)    All other components within normal limits     IMAGES: CT Abd/pelvis 05/09/23 ([facility]): IMPRESSION:  No renal calculi hydronephrosis.  No bowel obstruction.    EKG: 06/29/23: Sinus bradycardia at 54 bpm Right bundle branch block Left anterior fascicular block Bifascicular block Minimal voltage criteria for LVH, may be normal variant ( R in aVL ) Septal infarct , age undetermined Abnormal ECG No significant change since last tracing Confirmed by Raford Riggs ([id number]) on 07/01/2023 8:22:07 AM   CV: Echo 08/24/11: Study Conclusions  Left ventricle: Systolic function was normal. The estimated  ejection fraction was in the range of 50% to 55%.    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  OCD (obsessive compulsive disorder)    Peripheral edema    Seizures (HCC)    Thyroid  disease    Tobacco use disorder 05/09/2022    Past Surgical History:  Procedure Laterality Date   AMPUTATION Right 10/24/2014   Procedure: AMPUTATION RAY RIGHT SECOND TOE;  Surgeon: Marcey Her, MD;  Location: [facility];  Service: Orthopedics;  Laterality: Right;   AMPUTATION Right 10/27/2014   Procedure: RIGHT BELOW KNEE AMPUTATION;  Surgeon: Norleen Armor, MD;  Location: [facility] OR;  Service: Orthopedics;  Laterality: Right;   FRACTURE SURGERY Left 2001   KNEE ARTHROSCOPY     TONSILLECTOMY      MEDICATIONS:  ACCU-CHEK GUIDE test strip   Accu-Chek  Softclix Lancets lancets   acetaminophen  (TYLENOL ) 500 MG tablet   acyclovir  (ZOVIRAX ) 400 MG tablet   albuterol  (PROVENTIL  HFA;VENTOLIN  HFA) 108 (90 BASE) MCG/ACT inhaler   atorvastatin  (LIPITOR) 40 MG tablet   b complex vitamins capsule   blood glucose meter kit and supplies KIT   Capsaicin-Menthol-Methyl Sal (CAPSAICIN-METHYL SAL-MENTHOL) 0.025-1-12 % CREA   chlorhexidine  (PERIDEX ) 0.12 % solution   Cholecalciferol (VITAMIN D3) 50 MCG (2000 UT) capsule   fluticasone  (FLONASE ) 50 MCG/ACT nasal spray   fluticasone -salmeterol (ADVAIR) 250-50 MCG/ACT AEPB   gabapentin  (NEURONTIN ) 100 MG capsule   glipiZIDE  (GLUCOTROL ) 10 MG tablet   hydrochlorothiazide  (HYDRODIURIL ) 12.5 MG tablet   insulin  aspart (NOVOLOG ) 100 UNIT/ML FlexPen   insulin  detemir (LEVEMIR  FLEXTOUCH) 100 UNIT/ML FlexPen   Insulin  Pen Needle (PEN NEEDLES 3/16) 31G X 5 MM MISC   levothyroxine  (SYNTHROID , LEVOTHROID) 50 MCG tablet   lidocaine  (XYLOCAINE ) 2 % solution   nicotine  polacrilex (NICORETTE ) 2 MG gum   oxyCODONE -acetaminophen  (PERCOCET/ROXICET) 5-325 MG tablet   sertraline  (ZOLOFT ) 100 MG tablet   traZODone  (DESYREL ) 50 MG tablet   No current facility-administered medications for this encounter.    Isaiah Ruder, PA-C Surgical Short Stay/Anesthesiology [facility] Phone [phone] [facility] Phone [phone] 07/02/2023 2:06 PM

## 2023-07-02 NOTE — Anesthesia Preprocedure Evaluation (Addendum)
Anesthesia Evaluation  Patient identified by MRN, date of birth, ID band Patient awake    Reviewed: Allergy & Precautions, NPO status , Patient's Chart, lab work & pertinent test results  Airway Mallampati: II  TM Distance: >3 FB Neck ROM: Full    Dental no notable dental hx. (+) Chipped, Dental Advisory Given, Missing,    Pulmonary COPD,  COPD inhaler, former smoker   Pulmonary exam normal breath sounds clear to auscultation       Cardiovascular hypertension, Normal cardiovascular exam Rhythm:Regular Rate:Normal     Neuro/Psych Seizures -,   Anxiety Depression Bipolar Disorder      GI/Hepatic ,GERD  ,,  Endo/Other  diabetes, Type 2Hypothyroidism    Renal/GU CRFRenal diseaseLab Results      Component                Value               Date                      NA                       141                 06/29/2023                CL                       108                 06/29/2023                K                        5.1                 06/29/2023                CO2                      23                  06/29/2023                BUN                      42 (H)              06/29/2023                CREATININE               1.99 (H)            06/29/2023                GFRNONAA                 29 (L)              06/29/2023                CALCIUM                  9.0                 06/29/2023  PHOS                     5.6 (H)             05/11/2022                ALBUMIN                  3.1 (L)             01/02/2023                GLUCOSE                  104 (H)             06/29/2023                Musculoskeletal  (+) Arthritis , Rheumatoid disorders,    Abdominal  (+) + obese (bmi 38.2)  Peds  Hematology Lab Results      Component                Value               Date                      WBC                      7.1                 06/29/2023                HGB                       10.5 (L)            06/29/2023                HCT                      33.5 (L)            06/29/2023                MCV                      97.1                06/29/2023                PLT                      195                 06/29/2023              Anesthesia Other Findings All: lithium, hydroccortisone, abilify buproprion  Reproductive/Obstetrics                             Anesthesia Physical Anesthesia Plan  ASA: 3  Anesthesia Plan: General   Post-op Pain Management: Tylenol PO (pre-op)*   Induction: Intravenous  PONV Risk Score and Plan: Treatment may vary due to age or medical condition, Midazolam, Dexamethasone and Ondansetron  Airway Management Planned: Oral ETT  Additional Equipment: None  Intra-op Plan:   Post-operative Plan: Extubation in OR  Informed Consent: I have reviewed the  patients History and Physical, chart, labs and discussed the procedure including the risks, benefits and alternatives for the proposed anesthesia with the patient or authorized representative who has indicated his/her understanding and acceptance.     Dental advisory given  Plan Discussed with: CRNA and Anesthesiologist  Anesthesia Plan Comments: (PAT note written 07/02/2023 by Shonna Chock, PA-C.  )       Anesthesia Quick Evaluation

## 2023-07-05 ENCOUNTER — Encounter (HOSPITAL_COMMUNITY)
Admission: RE | Disposition: A | Discharge status: Home / Self Care | Payer: Self-pay | Source: Home / Self Care | Attending: Surgery

## 2023-07-05 ENCOUNTER — Other Ambulatory Visit: Payer: Self-pay

## 2023-07-05 ENCOUNTER — Encounter (HOSPITAL_COMMUNITY): Payer: Self-pay | Admitting: Surgery

## 2023-07-05 ENCOUNTER — Ambulatory Visit (HOSPITAL_COMMUNITY)
Admission: RE | Admit: 2023-07-05 | Discharge: 2023-07-05 | Disposition: A | Discharge status: Home / Self Care | Payer: Medicare HMO | Attending: Surgery | Admitting: Surgery

## 2023-07-05 ENCOUNTER — Ambulatory Visit (HOSPITAL_COMMUNITY): Payer: Self-pay | Admitting: Vascular Surgery

## 2023-07-05 ENCOUNTER — Ambulatory Visit (HOSPITAL_BASED_OUTPATIENT_CLINIC_OR_DEPARTMENT_OTHER): Payer: Medicare HMO | Admitting: Anesthesiology

## 2023-07-05 DIAGNOSIS — Z5986 Financial insecurity: Secondary | ICD-10-CM | POA: Diagnosis not present

## 2023-07-05 DIAGNOSIS — N184 Chronic kidney disease, stage 4 (severe): Secondary | ICD-10-CM | POA: Diagnosis not present

## 2023-07-05 DIAGNOSIS — Z6838 Body mass index (BMI) 38.0-38.9, adult: Secondary | ICD-10-CM | POA: Diagnosis not present

## 2023-07-05 DIAGNOSIS — K429 Umbilical hernia without obstruction or gangrene: Secondary | ICD-10-CM | POA: Diagnosis present

## 2023-07-05 DIAGNOSIS — I10 Essential (primary) hypertension: Secondary | ICD-10-CM

## 2023-07-05 DIAGNOSIS — Z59811 Housing instability, housed, with risk of homelessness: Secondary | ICD-10-CM | POA: Diagnosis not present

## 2023-07-05 DIAGNOSIS — E1122 Type 2 diabetes mellitus with diabetic chronic kidney disease: Secondary | ICD-10-CM | POA: Diagnosis not present

## 2023-07-05 DIAGNOSIS — E669 Obesity, unspecified: Secondary | ICD-10-CM | POA: Insufficient documentation

## 2023-07-05 DIAGNOSIS — I129 Hypertensive chronic kidney disease with stage 1 through stage 4 chronic kidney disease, or unspecified chronic kidney disease: Secondary | ICD-10-CM | POA: Insufficient documentation

## 2023-07-05 DIAGNOSIS — Z87891 Personal history of nicotine dependence: Secondary | ICD-10-CM | POA: Diagnosis not present

## 2023-07-05 DIAGNOSIS — J449 Chronic obstructive pulmonary disease, unspecified: Secondary | ICD-10-CM

## 2023-07-05 DIAGNOSIS — J4489 Other specified chronic obstructive pulmonary disease: Secondary | ICD-10-CM | POA: Insufficient documentation

## 2023-07-05 DIAGNOSIS — Z01818 Encounter for other preprocedural examination: Secondary | ICD-10-CM

## 2023-07-05 HISTORY — PX: UMBILICAL HERNIA REPAIR: SHX196

## 2023-07-05 HISTORY — PX: INSERTION OF MESH: SHX5868

## 2023-07-05 LAB — GLUCOSE, CAPILLARY
Glucose-Capillary: 47 mg/dL — ABNORMAL LOW (ref 70–99)
Glucose-Capillary: 154 mg/dL — ABNORMAL HIGH (ref 70–99)
Glucose-Capillary: 117 mg/dL — ABNORMAL HIGH (ref 70–99)

## 2023-07-05 SURGERY — REPAIR, HERNIA, UMBILICAL, ADULT
Anesthesia: General | Site: Abdomen

## 2023-07-05 MED ORDER — LIDOCAINE 2% (20 MG/ML) 5 ML SYRINGE
INTRAMUSCULAR | Status: DC | PRN
  Administered 2023-07-05: 100 mg via INTRAVENOUS

## 2023-07-05 MED ORDER — MIDAZOLAM HCL 2 MG/2ML IJ SOLN
INTRAMUSCULAR | Status: DC | PRN
  Administered 2023-07-05: 2 mg via INTRAVENOUS

## 2023-07-05 MED ORDER — ONDANSETRON HCL 4 MG/2ML IJ SOLN: 4.0000 mg | Freq: Once | INTRAMUSCULAR | Status: DC | PRN

## 2023-07-05 MED ORDER — ONDANSETRON HCL 4 MG/2ML IJ SOLN
INTRAMUSCULAR | Status: DC | PRN
  Administered 2023-07-05: 4 mg via INTRAVENOUS

## 2023-07-05 MED ORDER — DEXTROSE 50 % IV SOLN
25.0000 g | INTRAVENOUS | Status: AC
  Administered 2023-07-05: 25 g via INTRAVENOUS
  Filled 2023-07-05: qty 50

## 2023-07-05 MED ORDER — ACETAMINOPHEN 500 MG PO TABS
1000.0000 mg | ORAL_TABLET | ORAL | Status: AC
  Administered 2023-07-05: 1000 mg via ORAL
  Filled 2023-07-05: qty 2

## 2023-07-05 MED ORDER — ROCURONIUM BROMIDE 10 MG/ML (PF) SYRINGE
PREFILLED_SYRINGE | INTRAVENOUS | Status: AC
  Filled 2023-07-05: qty 10

## 2023-07-05 MED ORDER — OXYCODONE HCL 5 MG PO TABS
5.0000 mg | ORAL_TABLET | Freq: Once | ORAL | Status: AC | PRN
  Administered 2023-07-05: 5 mg via ORAL

## 2023-07-05 MED ORDER — KETOROLAC TROMETHAMINE 30 MG/ML IJ SOLN: 30.0000 mg | Freq: Once | INTRAMUSCULAR | Status: DC | PRN

## 2023-07-05 MED ORDER — ORAL CARE MOUTH RINSE: 15.0000 mL | Freq: Once | OROMUCOSAL | Status: AC

## 2023-07-05 MED ORDER — PROPOFOL 10 MG/ML IV BOLUS
INTRAVENOUS | Status: AC
  Filled 2023-07-05: qty 20

## 2023-07-05 MED ORDER — CHLORHEXIDINE GLUCONATE CLOTH 2 % EX PADS: 6.0000 | MEDICATED_PAD | Freq: Once | CUTANEOUS | Status: DC

## 2023-07-05 MED ORDER — ALBUTEROL SULFATE HFA 108 (90 BASE) MCG/ACT IN AERS
INHALATION_SPRAY | RESPIRATORY_TRACT | Status: DC | PRN
  Administered 2023-07-05: 2 via RESPIRATORY_TRACT

## 2023-07-05 MED ORDER — BUPIVACAINE-EPINEPHRINE 0.25% -1:200000 IJ SOLN
INTRAMUSCULAR | Status: DC | PRN
  Administered 2023-07-05: 10 mL

## 2023-07-05 MED ORDER — LIDOCAINE 2% (20 MG/ML) 5 ML SYRINGE
INTRAMUSCULAR | Status: AC
  Filled 2023-07-05: qty 5

## 2023-07-05 MED ORDER — GLYCOPYRROLATE PF 0.2 MG/ML IJ SOSY
PREFILLED_SYRINGE | INTRAMUSCULAR | Status: AC
  Filled 2023-07-05: qty 1

## 2023-07-05 MED ORDER — MIDAZOLAM HCL 2 MG/2ML IJ SOLN
INTRAMUSCULAR | Status: AC
  Filled 2023-07-05: qty 2

## 2023-07-05 MED ORDER — GLYCOPYRROLATE PF 0.2 MG/ML IJ SOSY
PREFILLED_SYRINGE | INTRAMUSCULAR | Status: DC | PRN
  Administered 2023-07-05: .2 mg via INTRAVENOUS

## 2023-07-05 MED ORDER — SUGAMMADEX SODIUM 200 MG/2ML IV SOLN
INTRAVENOUS | Status: DC | PRN
  Administered 2023-07-05: 200 mg via INTRAVENOUS

## 2023-07-05 MED ORDER — SUCCINYLCHOLINE CHLORIDE 200 MG/10ML IV SOSY
PREFILLED_SYRINGE | INTRAVENOUS | Status: AC
  Filled 2023-07-05: qty 10

## 2023-07-05 MED ORDER — ROCURONIUM BROMIDE 10 MG/ML (PF) SYRINGE
PREFILLED_SYRINGE | INTRAVENOUS | Status: DC | PRN
  Administered 2023-07-05: 60 mg via INTRAVENOUS

## 2023-07-05 MED ORDER — EPHEDRINE SULFATE-NACL 50-0.9 MG/10ML-% IV SOSY
PREFILLED_SYRINGE | INTRAVENOUS | Status: DC | PRN
  Administered 2023-07-05 (×2): 10 mg via INTRAVENOUS

## 2023-07-05 MED ORDER — OXYCODONE HCL 5 MG/5ML PO SOLN: 5.0000 mg | Freq: Once | ORAL | Status: AC | PRN

## 2023-07-05 MED ORDER — HYDROMORPHONE HCL 1 MG/ML IJ SOLN: 0.2500 mg | INTRAMUSCULAR | Status: DC | PRN

## 2023-07-05 MED ORDER — OXYCODONE HCL 5 MG PO TABS
ORAL_TABLET | ORAL | Status: AC
  Filled 2023-07-05: qty 1

## 2023-07-05 MED ORDER — CEFAZOLIN SODIUM-DEXTROSE 2-4 GM/100ML-% IV SOLN
2.0000 g | INTRAVENOUS | Status: AC
  Administered 2023-07-05: 2 g via INTRAVENOUS
  Filled 2023-07-05: qty 100

## 2023-07-05 MED ORDER — CHLORHEXIDINE GLUCONATE 0.12 % MT SOLN
15.0000 mL | Freq: Once | OROMUCOSAL | Status: AC
  Administered 2023-07-05: 15 mL via OROMUCOSAL
  Filled 2023-07-05: qty 15

## 2023-07-05 MED ORDER — 0.9 % SODIUM CHLORIDE (POUR BTL) OPTIME
TOPICAL | Status: DC | PRN
  Administered 2023-07-05: 1000 mL

## 2023-07-05 MED ORDER — LACTATED RINGERS IV SOLN: INTRAVENOUS | Status: DC | PRN

## 2023-07-05 MED ORDER — DEXAMETHASONE SODIUM PHOSPHATE 10 MG/ML IJ SOLN
INTRAMUSCULAR | Status: DC | PRN
  Administered 2023-07-05: 10 mg via INTRAVENOUS

## 2023-07-05 MED ORDER — FENTANYL CITRATE (PF) 250 MCG/5ML IJ SOLN
INTRAMUSCULAR | Status: DC | PRN
  Administered 2023-07-05: 50 ug via INTRAVENOUS
  Administered 2023-07-05: 100 ug via INTRAVENOUS

## 2023-07-05 MED ORDER — PHENYLEPHRINE 80 MCG/ML (10ML) SYRINGE FOR IV PUSH (FOR BLOOD PRESSURE SUPPORT)
PREFILLED_SYRINGE | INTRAVENOUS | Status: DC | PRN
  Administered 2023-07-05 (×3): 100 ug via INTRAVENOUS

## 2023-07-05 MED ORDER — PROPOFOL 10 MG/ML IV BOLUS
INTRAVENOUS | Status: DC | PRN
  Administered 2023-07-05: 150 mg via INTRAVENOUS

## 2023-07-05 MED ORDER — FENTANYL CITRATE (PF) 250 MCG/5ML IJ SOLN
INTRAMUSCULAR | Status: AC
  Filled 2023-07-05: qty 5

## 2023-07-05 MED ORDER — BUPIVACAINE-EPINEPHRINE (PF) 0.25% -1:200000 IJ SOLN
INTRAMUSCULAR | Status: AC
  Filled 2023-07-05: qty 30

## 2023-07-05 SURGICAL SUPPLY — 34 items
BAG COUNTER SPONGE SURGICOUNT (BAG) ×1 IMPLANT
BENZOIN TINCTURE PRP APPL 2/3 (GAUZE/BANDAGES/DRESSINGS) ×1 IMPLANT
BLADE CLIPPER SURG (BLADE) IMPLANT
CANISTER SUCT 3000ML PPV (MISCELLANEOUS) IMPLANT
CHLORAPREP W/TINT 26 (MISCELLANEOUS) ×1 IMPLANT
COVER SURGICAL LIGHT HANDLE (MISCELLANEOUS) ×1 IMPLANT
DRAPE LAPAROTOMY 100X72 PEDS (DRAPES) ×1 IMPLANT
DRSG TEGADERM 4X4.75 (GAUZE/BANDAGES/DRESSINGS) ×1 IMPLANT
ELECT CAUTERY BLADE 6.4 (BLADE) ×1 IMPLANT
ELECT REM PT RETURN 9FT ADLT (ELECTROSURGICAL) ×1 IMPLANT
ELECTRODE REM PT RTRN 9FT ADLT (ELECTROSURGICAL) ×1 IMPLANT
GAUZE 4X4 16PLY ~~LOC~~+RFID DBL (SPONGE) ×1 IMPLANT
GAUZE SPONGE 2X2 8PLY STRL LF (GAUZE/BANDAGES/DRESSINGS) ×1 IMPLANT
GAUZE SPONGE 2X2 STRL 8-PLY (GAUZE/BANDAGES/DRESSINGS) IMPLANT
GLOVE BIO SURGEON STRL SZ7 (GLOVE) ×1 IMPLANT
GLOVE BIOGEL PI IND STRL 6.5 (GLOVE) IMPLANT
GLOVE BIOGEL PI IND STRL 7.5 (GLOVE) ×1 IMPLANT
GOWN STRL REUS W/ TWL LRG LVL3 (GOWN DISPOSABLE) ×2 IMPLANT
KIT BASIN OR (CUSTOM PROCEDURE TRAY) ×1 IMPLANT
KIT TURNOVER KIT B (KITS) ×1 IMPLANT
MESH VENTRALEX ST 2.5 CRC MED (Mesh General) IMPLANT
NDL HYPO 25GX1X1/2 BEV (NEEDLE) ×1 IMPLANT
NEEDLE HYPO 25GX1X1/2 BEV (NEEDLE) ×1 IMPLANT
NS IRRIG 1000ML POUR BTL (IV SOLUTION) ×1 IMPLANT
PACK GENERAL/GYN (CUSTOM PROCEDURE TRAY) ×1 IMPLANT
PAD ARMBOARD 7.5X6 YLW CONV (MISCELLANEOUS) ×1 IMPLANT
STRIP CLOSURE SKIN 1/2X4 (GAUZE/BANDAGES/DRESSINGS) ×1 IMPLANT
SUT MNCRL AB 4-0 PS2 18 (SUTURE) ×1 IMPLANT
SUT NOVA NAB DX-16 0-1 5-0 T12 (SUTURE) IMPLANT
SUT NOVA NAB GS-21 0 18 T12 DT (SUTURE) ×1 IMPLANT
SUT VIC AB 3-0 SH 27X BRD (SUTURE) ×1 IMPLANT
SYR CONTROL 10ML LL (SYRINGE) ×1 IMPLANT
TOWEL GREEN STERILE (TOWEL DISPOSABLE) ×1 IMPLANT
TOWEL GREEN STERILE FF (TOWEL DISPOSABLE) ×1 IMPLANT

## 2023-07-05 NOTE — Discharge Instructions (Signed)
[facility] _______[facility]  UMBILICAL HERNIA REPAIR: POST OP INSTRUCTIONS  Always review your discharge instruction sheet given to you by the facility where your surgery was performed. IF YOU HAVE DISABILITY OR FAMILY LEAVE FORMS, YOU MUST BRING THEM TO THE OFFICE FOR PROCESSING.   DO NOT GIVE THEM TO YOUR DOCTOR.  1. A  prescription for pain medication may be given to you upon discharge.  Take your pain medication as prescribed, if needed.  If narcotic pain medicine is not needed, then you may take acetaminophen (Tylenol) or ibuprofen (Advil) as needed. 2. Take your usually prescribed medications unless otherwise directed. If you need a refill on your pain medication, please contact your pharmacy.  They will contact our office to request authorization. Prescriptions will not be filled after 5 pm or on week-ends. 3. You should follow a light diet the first 24 hours after arrival home, such as soup and crackers, etc.  Be sure to include lots of fluids daily.  Resume your normal diet the day after surgery. 4.Most patients will experience some swelling and bruising around the umbilicus.  Ice packs and reclining will help.  Swelling and bruising can take several days to resolve.  6. It is common to experience some constipation if taking pain medication after surgery.  Increasing fluid intake and taking a stool softener (such as Colace) will usually help or prevent this problem from occurring.  A mild laxative (Milk of Magnesia or Miralax) should be taken according to package directions if there are no bowel movements after 48 hours. 7. Unless discharge instructions indicate otherwise, you may remove your bandages 24-48 hours after surgery, and you may shower at that time.  You may have steri-strips (small skin tapes) in place directly over the incision.  These strips should be left on the skin for 7-10 days.   8. ACTIVITIES:  You may resume regular (light) daily activities beginning the next  day--such as daily self-care, walking, climbing stairs--gradually increasing activities as tolerated.  You may have sexual intercourse when it is comfortable.  Refrain from any heavy lifting or straining until approved by your doctor.  a.You may drive when you are no longer taking prescription pain medication, you can comfortably wear a seatbelt, and you can safely maneuver your car and apply brakes. b.RETURN TO WORK:   _____________________________________________  9.You should see your doctor in the office for a follow-up appointment approximately 2-3 weeks after your surgery.  Make sure that you call for this appointment within a day or two after you arrive home to insure a convenient appointment time. 10.OTHER INSTRUCTIONS: _________________________    _____________________________________  WHEN TO CALL YOUR DOCTOR: Fever over 101.0 Inability to urinate Nausea and/or vomiting Extreme swelling or bruising Continued bleeding from incision. Increased pain, redness, or drainage from the incision  The clinic staff is available to answer your questions during regular business hours.  Please don't hesitate to call and ask to speak to one of the nurses for clinical concerns.  If you have a medical emergency, go to the nearest emergency room or call 911.  A surgeon from [facility] is always on call at the hospital   [address], [address], [place], [place]  [address] ?  [address] [address], [place], [place]   [address] [phone] ? [phone] ? FAX [phone] Web site: [email or web address]

## 2023-07-05 NOTE — Anesthesia Postprocedure Evaluation (Signed)
Anesthesia Post Note  Patient: Mallory Potts  Procedure(s) Performed: HERNIA REPAIR UMBILICAL ADULT WITH MESH (Abdomen) INSERTION OF MESH (Abdomen)     Patient location during evaluation: PACU Anesthesia Type: General Level of consciousness: awake and alert Pain management: pain level controlled Vital Signs Assessment: post-procedure vital signs reviewed and stable Respiratory status: spontaneous breathing, nonlabored ventilation, respiratory function stable and patient connected to nasal cannula oxygen Cardiovascular status: blood pressure returned to baseline and stable Postop Assessment: no apparent nausea or vomiting Anesthetic complications: no  No notable events documented.  Last Vitals:  Vitals:   07/05/23 0930 07/05/23 0935  BP: (!) 140/72 (!) 143/63  Pulse: 79 75  Resp: 13 15  Temp:  36.7 C  SpO2: 99% 100%    Last Pain:  Vitals:   07/05/23 0905  TempSrc:   PainSc: 0-No pain                 Trevor Iha

## 2023-07-05 NOTE — Transfer of Care (Signed)
Immediate Anesthesia Transfer of Care Note  Patient: Mallory Potts  Procedure(s) Performed: HERNIA REPAIR UMBILICAL ADULT WITH MESH (Abdomen) INSERTION OF MESH (Abdomen)  Patient Location: PACU  Anesthesia Type:General  Level of Consciousness: awake, alert , and oriented  Airway & Oxygen Therapy: Patient Spontanous Breathing and Patient connected to face mask oxygen  Post-op Assessment: Report given to RN and Post -op Vital signs reviewed and stable  Post vital signs: Reviewed and stable  Last Vitals:  Vitals Value Taken Time  BP 164/80 07/05/23 0905  Temp    Pulse 84 07/05/23 0909  Resp 6 07/05/23 0909  SpO2 95 % 07/05/23 0909  Vitals shown include unfiled device data.  Last Pain:  Vitals:   07/05/23 0710  TempSrc:   PainSc: 4       Patients Stated Pain Goal: 3 (07/05/23 0710)  Complications: No notable events documented.

## 2023-07-05 NOTE — H&P (Signed)
Subjective    Chief Complaint: New Consultation and Umbilical Hernia       History of Present Illness: Mallory Potts is a 58 y.o. female who is seen today as an office consultation at the request of Dr. Ladona Horns for evaluation of New Consultation and Umbilical Hernia .     This is a 58 year old female with multiple medical issues including obesity, diabetes, chronic kidney disease, COPD who has had at least a 20-year history of an umbilical hernia.  This has enlarged slightly.  Recently has been causing more discomfort.  She denies any obstructive symptoms.  She was seen in the emergency department at [facility].  A CT scan there apparently showed a fat-containing umbilical hernia with no sign of bowel obstruction.  She presents now to discuss repair.  The patient quit smoking a couple months ago.  Her weight has decreased slightly over the last year.     Review of Systems: A complete review of systems was obtained from the patient.  I have reviewed this information and discussed as appropriate with the patient.  See HPI as well for other ROS.   Review of Systems  Constitutional: Negative.   HENT:  Positive for tinnitus.   Eyes: Negative.   Respiratory: Negative.    Cardiovascular:  Positive for leg swelling.  Gastrointestinal: Negative.   Genitourinary: Negative.   Musculoskeletal: Negative.   Skin: Negative.   Neurological: Negative.   Endo/Heme/Allergies: Negative.   Psychiatric/Behavioral:  The patient is nervous/anxious.         Medical History:     Past Medical History:  Diagnosis Date   Anemia     Anxiety     Arthritis     Asthma, unspecified asthma severity, unspecified whether complicated, unspecified whether persistent (HHS-HCC)     Chronic kidney disease     COPD (chronic obstructive pulmonary disease) (CMS/HHS-HCC)     Diabetes mellitus without complication (CMS/HHS-HCC)     GERD (gastroesophageal reflux disease)     Hyperlipidemia     Thyroid disease            Patient Active Problem List  Diagnosis   Aortic atherosclerosis (CMS-HCC)   Stage 4 chronic kidney disease (CMS/HHS-HCC)   Chronic obstructive pulmonary disease (CMS/HHS-HCC)   Class II obesity   Generalized anxiety disorder   Essential hypertension   Epilepsy (CMS/HHS-HCC)   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CMS/HHS-HCC)   Osteoarthritis   Rheumatoid arthritis (CMS/HHS-HCC)   Type 2 diabetes mellitus without complication (CMS/HHS-HCC)      History reviewed. No pertinent surgical history.         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   Varenicline Nausea and Other (See Comments)      Negative emotions   Aripiprazole Rash   Hydrocortisone Rash            Current Outpatient Medications on File Prior to Visit  Medication Sig Dispense Refill   doxycycline (VIBRAMYCIN) 100 MG capsule Take 100 mg by mouth 2 (two) times daily       nicotine polacrilex (NICORETTE) 2 mg gum Take 2 mg by mouth       sertraline (ZOLOFT) 100 MG tablet Take 100 mg by mouth once daily       multivitamin tablet Take 1 tablet by mouth  once daily       traZODone (DESYREL) 50 MG tablet Take 50 mg by mouth at bedtime        No current facility-administered medications on file prior to visit.           Family History  Problem Relation Age of Onset   Stroke Mother     Obesity Mother     High blood pressure (Hypertension) Mother     Hyperlipidemia (Elevated cholesterol) Mother     Diabetes Mother     Deep vein thrombosis (DVT or abnormal blood clot formation) Mother     Breast cancer Mother     Obesity Father     High blood pressure (Hypertension) Father     Hyperlipidemia (Elevated cholesterol) Father     Coronary Artery Disease (Blocked arteries around heart) Father     Diabetes Father     Skin cancer Sister     Obesity Sister      High blood pressure (Hypertension) Sister     Diabetes Sister     Coronary Artery Disease (Blocked arteries around heart) Sister     Obesity Brother     High blood pressure (Hypertension) Brother     Diabetes Brother        Social History        Tobacco Use  Smoking Status Former   Types: Cigarettes  Smokeless Tobacco Never      Social History         Socioeconomic History   Marital status: Single  Tobacco Use   Smoking status: Former      Types: Cigarettes   Smokeless tobacco: Never  Substance and Sexual Activity   Alcohol use: Yes   Drug use: Never    Social Drivers of Acupuncturist Strain: Medium Risk (08/30/2022)    Received from [facility]    Overall Financial Resource Strain (CARDIA)     Difficulty of Paying Living Expenses: Somewhat hard  Food Insecurity: No Food Insecurity (05/10/2022)    Received from [facility]    Hunger Vital Sign     Worried About Running Out of Food in the Last Year: Never true     Ran Out of Food in the Last Year: Never true  Transportation Needs: No Transportation Needs (05/10/2022)    Received from [facility] - Transportation     Lack of Transportation (Medical): No     Lack of Transportation (Non-Medical): No  Physical Activity: Insufficiently Active (08/30/2022)    Received from [facility]    Exercise Vital Sign     Days of Exercise per Week: 7 days     Minutes of Exercise per Session: 20 min  Stress: Stress Concern Present (08/30/2022)    Received from [facility] - Occupational Stress Questionnaire     Feeling of Stress : Very much  Social Connections: Moderately Isolated (08/30/2022)    Received from [facility]    Social Connection and Isolation Panel [NHANES]     Frequency of Communication with Friends and Family: More than three times a week     Frequency of Social Gatherings with Friends and Family: Once a week     Attends Religious  Services: 1 to 4 times per year     Active Member of [employer] or Organizations: No     Attends Banker Meetings: Never  Marital Status: Never married  Housing Stability: High Risk (05/30/2023)    Housing Stability Vital Sign     Homeless in the Last Year: Yes      Objective:         Vitals:    05/30/23 1437  BP: (!) 170/63  Pulse: 76  Temp: 36.1 C (97 F)  SpO2: 96%  Weight: 98.9 kg (218 lb)  Height: 160 cm (5\' 3" )    Body mass index is 38.62 kg/m.   Physical Exam    Constitutional:  WDWN in NAD, conversant, no obvious deformities; lying in bed comfortably Eyes:  Pupils equal, round; sclera anicteric; moist conjunctiva; no lid lag HENT:  Oral mucosa moist; good dentition  Neck:  No masses palpated, trachea midline; no thyromegaly Lungs:  CTA bilaterally; normal respiratory effort CV:  Regular rate and rhythm; no murmurs; extremities well-perfused with no edema Abd:  +bowel sounds, soft, non-tender, no palpable organomegaly; protruding umbilical hernia that is reducible when supine.  The fascial defect is approximately 3 cm in diameter. Musc:  Ambulates with a cane/ prosthetic; no apparent clubbing or cyanosis in extremities Lymphatic:  No palpable cervical or axillary lymphadenopathy Skin:  Warm, dry; no sign of jaundice Psychiatric - alert and oriented x 4; calm mood and affect     Labs, Imaging and Diagnostic Testing: CLINICAL DATA:  Abdominal pain, acute nonlocalized. Vomiting for 2 days with right flank pain. Flu last week.   EXAM: CT ABDOMEN AND PELVIS WITHOUT CONTRAST   TECHNIQUE: Multidetector CT imaging of the abdomen and pelvis was performed following the standard protocol without IV contrast.   RADIATION DOSE REDUCTION: This exam was performed according to the departmental dose-optimization program which includes automated exposure control, adjustment of the mA and/or kV according to patient size and/or use of iterative reconstruction  technique.   COMPARISON:  Abdominopelvic CT 10/29/2012.   FINDINGS: Lower chest: Mild aortic atherosclerosis and a small hiatal hernia. There is dependent right lower lobe airspace disease suspicious for pneumonia. There is a small right lower lobe calcified granuloma. The left lung base appears clear. No significant pleural or pericardial effusion.   Hepatobiliary: Suspected mild hepatic steatosis without evidence of focal abnormality on noncontrast imaging. The gallbladder is incompletely distended. No evidence of gallstones, gallbladder wall thickening or biliary dilatation.   Pancreas: Unremarkable. No pancreatic ductal dilatation or surrounding inflammatory changes.   Spleen: Normal in size without focal abnormality.   Adrenals/Urinary Tract: Both adrenal glands appear normal. Increased perinephric soft tissue stranding, left greater than right. No evidence of focal perinephric fluid collection, urinary tract calculus or hydronephrosis. Mild bladder distension. There is a small amount of air in the urinary bladder which may be iatrogenic. No other bladder abnormality identified.   Stomach/Bowel: No enteric contrast administered. The stomach appears unremarkable for its degree of distension. No evidence of bowel wall thickening, distention or surrounding inflammatory change. The appendix appears normal. Mild sigmoid diverticulosis.   Vascular/Lymphatic: There are no enlarged abdominal or pelvic lymph nodes. Aortic and branch vessel atherosclerosis without evidence of aneurysm.   Reproductive: The uterus and ovaries appear normal. No adnexal mass.   Other: 5.5 cm umbilical hernia containing only fat. No herniated bowel, ascites or free air.   Musculoskeletal: No acute or significant osseous findings. 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   IMPRESSION: 1. Right lower  lobe airspace disease suspicious for pneumonia. Recommend chest radiographic follow-up to radiographic  resolution. 2. Nonspecific perinephric soft tissue stranding, left greater than right. No evidence of urinary tract calculus or hydronephrosis. Small amount of air in the urinary bladder, likely iatrogenic. 3. Umbilical hernia containing only fat, minimally enlarged from remote prior CT. 4. 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 5.  Aortic Atherosclerosis (ICD10-I70.0).     Electronically Signed   By: Carey Bullocks M.D.   On: 05/09/2022 10:58     Assessment and Plan:  Diagnoses and all orders for this visit:   Umbilical hernia without obstruction or gangrene     Recommend umbilical hernia repair with mesh.The surgical procedure has been discussed with the patient.  Potential risks, benefits, alternative treatments, and expected outcomes have been explained.  All of the patient's questions at this time have been answered.  The likelihood of reaching the patient's treatment goal is good.  The patient understands the proposed surgical procedure and wishes to proceed.    Mallory Arms. Corliss Skains, MD, [facility]  General Surgery   07/05/2023 7:48 AM

## 2023-07-05 NOTE — Op Note (Signed)
Indications:  The patient presented with a history of an enlarging umbilical hernia.  The patient was examined and we recommended umbilical hernia repair with mesh.  Pre-operative diagnosis:  Umbilical hernia (3 cm fascial defect)  Post-operative diagnosis:  Same (3 cm fascial defect)  Procedure:  Umbilical hernia repair with mesh  Procedure Details  The patient was seen again in the Holding Room. The risks, benefits, complications, treatment options, and expected outcomes were discussed with the patient. The possibilities of reaction to medication, pulmonary aspiration, perforation of viscus, bleeding, recurrent infection, the need for additional procedures, and development of a complication requiring transfusion or further operation were discussed with the patient and/or family. There was concurrence with the proposed plan, and informed consent was obtained. The site of surgery was properly noted/marked. The patient was taken to the Operating Room, identified as Mallory Potts, and the procedure verified as umbilical hernia repair. A Time Out was held and the above information confirmed.  After an adequate level of general anesthesia was obtained, the patient's abdomen was prepped with Chloraprep and draped in sterile fashion.  We made a transverse incision above the umbilicus.  Dissection was carried down to the hernia sac with cautery.  The hernia sac was dissected free from the overlying dermis. We opened the hernia sac, which contained a large amount of omentum.  We reduced the omentum back into the peritoneum and excised the hernia sac down to the fascial edge.   The fascial defect measured 3 cm.  We cleared the fascia in all directions.  A medium Ventralex mesh was inserted into the peritoneal space and was deployed.  The mesh was secured with six trans-fascial sutures of 0 Novofil.  The fascial defect was closed with multiple interrupted figure-of-eight 1 Novofil sutures.  The base of the umbilicus  was tacked down with 3-0 Vicryl.  3-0 Vicryl was used to close the subcutaneous tissues and 4-0 Monocryl was used to close the skin.  Steri-strips and clean dressing were applied.  The patient was extubated and brought to the recovery room in stable condition.  All sponge, instrument, and needle counts were correct prior to closure and at the conclusion of the case.   Estimated Blood Loss: Minimal          Complications: None; patient tolerated the procedure well.         Disposition: PACU - hemodynamically stable.         Condition: stable  Wilmon Arms. Corliss Skains, MD, [facility]  General Surgery   07/05/2023 8:59 AM

## 2023-07-05 NOTE — Anesthesia Procedure Notes (Signed)
Procedure Name: Intubation Date/Time: 07/05/2023 8:20 AM  Performed by: Izola Price., CRNAPre-anesthesia Checklist: Patient identified, Emergency Drugs available, Suction available and Patient being monitored Patient Re-evaluated:Patient Re-evaluated prior to induction Oxygen Delivery Method: Circle system utilized Preoxygenation: Pre-oxygenation with 100% oxygen Induction Type: IV induction Ventilation: Mask ventilation without difficulty Laryngoscope Size: Mac and 3 Grade View: Grade I Tube type: Oral Tube size: 7.0 mm Number of attempts: 1 Airway Equipment and Method: Stylet Placement Confirmation: ETT inserted through vocal cords under direct vision, positive ETCO2 and breath sounds checked- equal and bilateral Secured at: 21 cm Tube secured with: Tape Dental Injury: Teeth and Oropharynx as per pre-operative assessment

## 2023-07-06 ENCOUNTER — Encounter (HOSPITAL_COMMUNITY): Payer: Self-pay | Admitting: Surgery

## 2023-07-06 LAB — SURGICAL PATHOLOGY

## 2023-09-13 ENCOUNTER — Other Ambulatory Visit: Payer: Self-pay | Admitting: Family Medicine

## 2023-09-13 DIAGNOSIS — F17200 Nicotine dependence, unspecified, uncomplicated: Secondary | ICD-10-CM

## 2023-09-18 ENCOUNTER — Encounter: Payer: Self-pay | Admitting: Family Medicine

## 2023-09-24 ENCOUNTER — Ambulatory Visit
Admission: RE | Admit: 2023-09-24 | Discharge: 2023-09-24 | Disposition: A | Discharge status: Home / Self Care | Source: Ambulatory Visit | Attending: Family Medicine | Admitting: Family Medicine

## 2023-09-24 DIAGNOSIS — F17200 Nicotine dependence, unspecified, uncomplicated: Secondary | ICD-10-CM

## 2023-10-01 ENCOUNTER — Ambulatory Visit: Admission: EM | Admit: 2023-10-01 | Discharge: 2023-10-01 | Disposition: A | Discharge status: Home / Self Care

## 2023-10-01 DIAGNOSIS — L723 Sebaceous cyst: Secondary | ICD-10-CM

## 2023-10-01 DIAGNOSIS — D367 Benign neoplasm of other specified sites: Secondary | ICD-10-CM

## 2023-10-01 MED ORDER — SULFAMETHOXAZOLE-TRIMETHOPRIM 800-160 MG PO TABS: 1.0000 | ORAL_TABLET | Freq: Two times a day (BID) | ORAL | 0 refills | Status: AC

## 2023-10-01 NOTE — Discharge Instructions (Addendum)
 Advised patient to take medication as directed with food to completion.  Encouraged to increase daily water intake to 64 ounces per day while taking these medications.  Advised if symptoms worsen and/or unresolved please follow-up with your PCP or here for further evaluation.

## 2023-10-01 NOTE — ED Provider Notes (Signed)
 [facility]    CSN: [id number] Arrival date & time: 10/01/23  1736      History   Chief Complaint Chief Complaint  Patient presents with   Wound Check    RT leg    HPI Mallory Potts is a 58 y.o. female.   HPI Very pleasant 58 year old female presents with skin abscess/epidermoid cyst of left leg.  PMH significant for COPD, bipolar 1 disorder, and T2DM.  Patient is accompanied by her friend this evening.  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   OCD (obsessive compulsive disorder)    Peripheral edema    Seizures (HCC)    Thyroid  disease    Tobacco use disorder 05/09/2022    Patient Active Problem List   Diagnosis Date Noted   Cannabis use disorder 11/30/2022   Chronic obstructive lung disease (HCC) 10/18/2022   Type 2 diabetes mellitus (HCC) 10/18/2022   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   MDD 06/14/2022   Right lower lobe pneumonia 05/09/2022   Normocytic anemia 05/09/2022   Class II obesity 05/09/2022   Aortic atherosclerosis (HCC) 05/09/2022   Tobacco use disorder 05/09/2022   Generalized anxiety disorder 03/13/2022   PTSD (post-traumatic stress disorder) 03/13/2022   Long term (current) use of non-steroidal anti-inflammatories (nsaid) 08/08/2021   Proteinuria 08/08/2021   Microalbuminuria 06/29/2021   Stage 3b chronic kidney disease (HCC) 06/29/2021   Chronic kidney disease 08/20/2020   Hypothyroidism 08/20/2020   Insomnia 08/20/2020   Morbid obesity (HCC) 08/20/2020   Osteoarthritis  08/20/2020   Peripheral neuropathic pain 08/20/2020   Rheumatoid arthritis (HCC) 08/20/2020   Seizure (HCC) 11/13/2014   Acute renal failure (HCC) 10/29/2014   Cellulitis of foot 10/24/2014   Hyponatremia 10/24/2014   Hypokalemia 10/24/2014   DM (diabetes mellitus), type 2, uncontrolled    Epilepsy (HCC)    COPD (chronic obstructive pulmonary disease) (HCC)    Borderline personality disorder (HCC)    Hypertension    Essential hypertension    Other epilepsy without status epilepticus, not intractable (HCC)     Past Surgical History:  Procedure Laterality Date   AMPUTATION Right 10/24/2014   Procedure: AMPUTATION RAY RIGHT SECOND TOE;  Surgeon: Winston Hawking, MD;  Location: [facility];  Service: Orthopedics;  Laterality: Right;   AMPUTATION Right 10/27/2014   Procedure: RIGHT BELOW KNEE AMPUTATION;  Surgeon: Amada Backer, MD;  Location: [facility] OR;  Service: Orthopedics;  Laterality: Right;   FRACTURE SURGERY Left 2001   INSERTION OF MESH N/A 07/05/2023   Procedure: INSERTION OF MESH;  Surgeon: Dareen Ebbing, MD;  Location: [facility];  Service: General;  Laterality: N/A;   KNEE ARTHROSCOPY     TONSILLECTOMY     UMBILICAL HERNIA REPAIR N/A 07/05/2023   Procedure: HERNIA REPAIR UMBILICAL ADULT WITH MESH;  Surgeon: Dareen Ebbing, MD;  Location: [facility] OR;  Service: General;  Laterality: N/A;    OB History   No obstetric history on file.      Home  Medications    Prior to Admission medications   Medication Sig Start Date End Date Taking? Authorizing Provider  JARDIANCE  25 MG TABS tablet Take 25 mg by mouth daily. 07/05/23  Yes [provider]  sulfamethoxazole-trimethoprim (BACTRIM DS) 800-160 MG tablet Take 1 tablet by mouth 2 (two) times daily for 10 days. 10/01/23 10/11/23 Yes Leonides Ramp, FNP  ACCU-CHEK GUIDE test strip UP TO 4 TIMES A DAY 05/13/22   [provider]  Accu-Chek Softclix Lancets lancets USE AS DIRECTED 4 TIMES A DAY 05/13/22   [provider]   acetaminophen  (TYLENOL ) 500 MG tablet Take 1,000 mg by mouth every 6 (six) hours as needed for moderate pain (pain score 4-6).    [provider]  acyclovir  (ZOVIRAX ) 400 MG tablet Take 2 tablets (800 mg total) by mouth 5 (five) times daily. Patient not taking: Reported on 06/28/2023 10/28/22   Prosperi, Christian H, PA-C  albuterol  (PROVENTIL  HFA;VENTOLIN  HFA) 108 (90 BASE) MCG/ACT inhaler Inhale 2 puffs into the lungs every 6 (six) hours as needed for wheezing or shortness of breath. 11/08/11   Ethlyn Herd, MD  atorvastatin  (LIPITOR) 40 MG tablet Take 40 mg by mouth every evening.    [provider]  b complex vitamins capsule Take 1 capsule by mouth daily.    [provider]  blood glucose meter kit and supplies KIT Dispense based on patient and insurance preference. Use up to four times daily as directed. 05/13/22   Lavaughn Portland, MD  Capsaicin-Menthol-Methyl Sal (CAPSAICIN-METHYL SAL-MENTHOL) 0.025-1-12 % CREA Apply 1 Dose topically in the morning, at noon, in the evening, and at bedtime. Patient taking differently: Apply 1 Dose topically daily as needed (knee pain). 10/28/22   Prosperi, Christian H, PA-C  chlorhexidine  (PERIDEX ) 0.12 % solution Use as directed 15 mLs in the mouth or throat 2 (two) times daily. Patient taking differently: Use as directed 15 mLs in the mouth or throat daily. 11/15/22   Coretta Dexter, PA  Cholecalciferol (VITAMIN D3) 50 MCG (2000 UT) capsule Take 2,000 Units by mouth daily.    [provider]  famotidine  (PEPCID ) 20 MG tablet TAKE 1 TABLET BY MOUTH TWICE A DAY Orally twice a day for 90 days    [provider]  fluticasone  (FLONASE ) 50 MCG/ACT nasal spray Place 1-2 sprays into both nostrils daily as needed for allergies.    [provider]  fluticasone -salmeterol (ADVAIR) 250-50 MCG/ACT AEPB Inhale 1 puff into the lungs daily.    [provider]  gabapentin  (NEURONTIN ) 100 MG capsule Take 200 mg by mouth 2  (two) times daily. 09/26/22   [provider]  glipiZIDE  (GLUCOTROL ) 10 MG tablet Take 10 mg by mouth daily before breakfast.    [provider]  hydrochlorothiazide  (HYDRODIURIL ) 12.5 MG tablet Take 1 tablet (12.5 mg total) by mouth daily. 05/16/22   Lavaughn Portland, MD  insulin  aspart (NOVOLOG ) 100 UNIT/ML FlexPen Insulin  sliding scale: Blood sugar  120-150   3units                       151-200   4units                       201-250   7units                       251- 300  11units  301-350   15uints                       351-400   20units                       >400         call MD immediately 05/13/22   Lavaughn Portland, MD  insulin  detemir (LEVEMIR  FLEXTOUCH) 100 UNIT/ML FlexPen Inject 10 Units into the skin daily. Patient taking differently: Inject 15 Units into the skin daily. 05/13/22   Lavaughn Portland, MD  Insulin  Pen Needle (PEN NEEDLES 3/16") 31G X 5 MM MISC For insulin  dependent dm2 05/13/22   Lavaughn Portland, MD  levothyroxine  (SYNTHROID , LEVOTHROID) 50 MCG tablet Take 50 mcg by mouth daily before breakfast.    [provider]  lisinopril (ZESTRIL) 5 MG tablet 1 tablet Orally Once a day    [provider]  sertraline  (ZOLOFT ) 100 MG tablet Take 1 tablet (100 mg total) by mouth in the morning. 04/12/23 07/11/23  Nguyen, Julie, DO  traZODone  (DESYREL ) 50 MG tablet Take 0.5-1 tablets (25-50 mg total) by mouth at bedtime as needed for sleep. Patient taking differently: Take 50 mg by mouth at bedtime. 04/12/23 07/11/23  Nguyen, Julie, DO  spironolactone  (ALDACTONE ) 25 MG tablet Take 1 tablet (25 mg total) by mouth daily. 06/19/11 10/26/11  Collette Dayhoff, MD    Family History Family History  Problem Relation Age of Onset   Asthma Other    Diabetes Other    Hypertension Other     Social History Social History   Tobacco Use   Smoking status: Former    Average packs/day: 0.5 packs/day for 5.0 years (2.5 ttl pk-yrs)    Types: Cigarettes    Start date: 1984    Smokeless tobacco: Never  Vaping Use   Vaping status: Never Used  Substance Use Topics   Alcohol use: No    Comment: OCASSIONALLY   Drug use: No     Allergies   Lithium, Chantix [varenicline], Milk-related compounds, Other, Wellbutrin [bupropion], Abilify [aripiprazole], and Hydrocortisone   Review of Systems Review of Systems  Skin:        Sebaceous cyst of right lower leg x 2 days     Physical Exam Triage Vital Signs ED Triage Vitals  Encounter Vitals Group     BP      Systolic BP Percentile      Diastolic BP Percentile      Pulse      Resp      Temp      Temp src      SpO2      Weight      Height      Head Circumference      Peak Flow      Pain Score      Pain Loc      Pain Education      Exclude from Growth Chart    No data found.  Updated Vital Signs BP 124/64 (BP Location: Right Arm)   Pulse 69   Temp 98.4 F (36.9 C) (Oral)   Resp 17   LMP 10/09/2014 (Exact Date)   SpO2 95%   Physical Exam Vitals and nursing note reviewed.  Constitutional:      Appearance: Normal appearance. She is obese.  HENT:     Head: Normocephalic and atraumatic.     Mouth/Throat:     Mouth: Mucous membranes are  moist.     Pharynx: Oropharynx is clear.  Eyes:     Extraocular Movements: Extraocular movements intact.     Conjunctiva/sclera: Conjunctivae normal.     Pupils: Pupils are equal, round, and reactive to light.  Cardiovascular:     Rate and Rhythm: Normal rate and regular rhythm.     Pulses: Normal pulses.     Heart sounds: Normal heart sounds.  Pulmonary:     Effort: Pulmonary effort is normal.     Breath sounds: Normal breath sounds. No wheezing, rhonchi or rales.  Musculoskeletal:        General: Normal range of motion.     Cervical back: Normal range of motion and neck supple.  Skin:    General: Skin is warm and dry.     Comments: Sebaceous cyst of right leg colon  ~2.0 cm circular shaped erythematous abscess noted please see image below   Neurological:     General: No focal deficit present.     Mental Status: She is alert and oriented to person, place, and time. Mental status is at baseline.  Psychiatric:        Mood and Affect: Mood normal.        Behavior: Behavior normal.         UC Treatments / Results  Labs (all labs ordered are listed, but only abnormal results are displayed) Labs Reviewed - No data to display  EKG   Radiology No results found.  Procedures Procedures (including critical care time)  Medications Ordered in UC Medications - No data to display  Initial Impression / Assessment and Plan / UC Course  I have reviewed the triage vital signs and the nursing notes.  Pertinent labs & imaging results that were available during my care of the patient were reviewed by me and considered in my medical decision making (see chart for details).     MDM: Dermoid cyst of leg, right-Rx'd Bactrim DS 800/160 mg tablet: Take 1 tablet twice daily x 10 days; 2.  Sebaceous cyst-Rx'd Bactrim DS 800/160 mg tablet: Take 1 tablet twice daily x 10 days. Advised patient to take medication as directed with food to completion.  Encouraged to increase daily water intake to 64 ounces per day while taking these medications.  Advised if symptoms worsen and/or unresolved please follow-up with your PCP or here for further evaluation.  Patient discharged home, hemodynamically stable. Final Clinical Impressions(s) / UC Diagnoses   Final diagnoses:  Dermoid cyst of leg, right  Sebaceous cyst     Discharge Instructions      Advised patient to take medication as directed with food to completion.  Encouraged to increase daily water intake to 64 ounces per day while taking these medications.  Advised if symptoms worsen and/or unresolved please follow-up with your PCP or here for further evaluation.   ED Prescriptions     Medication Sig Dispense Auth. Provider   sulfamethoxazole-trimethoprim (BACTRIM DS) 800-160 MG tablet  Take 1 tablet by mouth 2 (two) times daily for 10 days. 20 tablet Dior Stepter, FNP      PDMP not reviewed this encounter.   Leonides Ramp, FNP 10/01/23 1826

## 2023-10-01 NOTE — ED Triage Notes (Addendum)
 Pt c/o cyst/boil type wound on RT leg x 1 week. Came to a head 3 days ago, started draining some. Then popped fully yesterday. Tylenol  prn.
